# Patient Record
Sex: Male | Born: 1985
Health system: Southern US, Community
[De-identification: ages and names within clinical notes are randomized; demographics above are authoritative.]

## PROBLEM LIST (undated history)

## (undated) ENCOUNTER — Emergency Department (HOSPITAL_COMMUNITY): Admission: EM | Payer: BC Managed Care – PPO | Source: Home / Self Care

## (undated) DIAGNOSIS — T7840XA Allergy, unspecified, initial encounter: Secondary | ICD-10-CM

## (undated) DIAGNOSIS — J45909 Unspecified asthma, uncomplicated: Secondary | ICD-10-CM

## (undated) DIAGNOSIS — C9211 Chronic myeloid leukemia, BCR/ABL-positive, in remission: Secondary | ICD-10-CM

## (undated) DIAGNOSIS — C9210 Chronic myeloid leukemia, BCR/ABL-positive, not having achieved remission: Secondary | ICD-10-CM

## (undated) DIAGNOSIS — F329 Major depressive disorder, single episode, unspecified: Secondary | ICD-10-CM

## (undated) DIAGNOSIS — F32A Depression, unspecified: Secondary | ICD-10-CM

## (undated) DIAGNOSIS — G43909 Migraine, unspecified, not intractable, without status migrainosus: Secondary | ICD-10-CM

## (undated) DIAGNOSIS — C9590 Leukemia, unspecified not having achieved remission: Secondary | ICD-10-CM

## (undated) HISTORY — DX: Chronic myeloid leukemia, BCR/ABL-positive, in remission: C92.11

## (undated) HISTORY — DX: Chronic myeloid leukemia, BCR/ABL-positive, not having achieved remission: C92.10

## (undated) HISTORY — PX: BRONCHOSCOPY: SUR163

## (undated) HISTORY — DX: Allergy, unspecified, initial encounter: T78.40XA

## (undated) HISTORY — DX: Unspecified asthma, uncomplicated: J45.909

## (undated) HISTORY — PX: APPENDECTOMY: SHX54

## (undated) HISTORY — DX: Migraine, unspecified, not intractable, without status migrainosus: G43.909

## (undated) HISTORY — PX: FINGER SURGERY: SHX640

## (undated) HISTORY — DX: Leukemia, unspecified not having achieved remission: C95.90

---

## 2008-05-08 DIAGNOSIS — C9590 Leukemia, unspecified not having achieved remission: Secondary | ICD-10-CM

## 2008-05-08 HISTORY — DX: Leukemia, unspecified not having achieved remission: C95.90

## 2008-08-12 ENCOUNTER — Ambulatory Visit: Payer: Self-pay | Admitting: Oncology

## 2008-08-12 ENCOUNTER — Inpatient Hospital Stay (HOSPITAL_COMMUNITY): Admission: EM | Admit: 2008-08-12 | Discharge: 2008-08-14 | Payer: Self-pay | Admitting: Emergency Medicine

## 2008-08-13 ENCOUNTER — Encounter (INDEPENDENT_AMBULATORY_CARE_PROVIDER_SITE_OTHER): Payer: Self-pay | Admitting: Interventional Radiology

## 2008-08-14 ENCOUNTER — Ambulatory Visit: Payer: Self-pay | Admitting: Oncology

## 2008-08-21 LAB — MANUAL DIFFERENTIAL
ALC: 0 10*3/uL — ABNORMAL LOW (ref 0.9–3.3)
ANC (CHCC manual diff): 231.8 10*3/uL — ABNORMAL HIGH (ref 1.5–6.5)
Band Neutrophils: 18 % — ABNORMAL HIGH (ref 0–10)
Basophil: 6 % — ABNORMAL HIGH (ref 0–2)
Blasts: 2 % — ABNORMAL HIGH (ref 0–0)
Metamyelocytes: 15 % — ABNORMAL HIGH (ref 0–0)
PROMYELO: 4 % — ABNORMAL HIGH (ref 0–0)
Variant Lymph: 0 % (ref 0–0)
nRBC: 0 % (ref 0–0)

## 2008-08-21 LAB — COMPREHENSIVE METABOLIC PANEL
Albumin: 4 g/dL (ref 3.5–5.2)
BUN: 21 mg/dL (ref 6–23)
CO2: 25 mEq/L (ref 19–32)
Glucose, Bld: 76 mg/dL (ref 70–99)
Potassium: 3.8 mEq/L (ref 3.5–5.3)
Sodium: 135 mEq/L (ref 135–145)
Total Protein: 7.2 g/dL (ref 6.0–8.3)

## 2008-08-21 LAB — LACTATE DEHYDROGENASE: LDH: 350 U/L — ABNORMAL HIGH (ref 94–250)

## 2008-08-21 LAB — CBC WITH DIFFERENTIAL/PLATELET
HCT: 29.3 % — ABNORMAL LOW (ref 38.4–49.9)
HGB: 9.4 g/dL — ABNORMAL LOW (ref 13.0–17.1)
MCH: 28.4 pg (ref 27.2–33.4)
MCV: 88.8 fL (ref 79.3–98.0)
MONO%: 0.4 % (ref 0.0–14.0)
Platelets: 353 10*3/uL (ref 140–400)
RDW: 20.8 % — ABNORMAL HIGH (ref 11.0–14.6)
WBC: 269.5 10*3/uL (ref 4.0–10.3)

## 2008-09-03 LAB — CBC WITH DIFFERENTIAL/PLATELET
LYMPH%: 4.6 % — ABNORMAL LOW (ref 14.0–49.0)
MONO%: 1.6 % (ref 0.0–14.0)
NEUT%: 90.7 % — ABNORMAL HIGH (ref 39.0–75.0)
RBC: 3.9 10*6/uL — ABNORMAL LOW (ref 4.20–5.82)
RDW: 20.4 % — ABNORMAL HIGH (ref 11.0–14.6)
WBC: 71.3 10*3/uL (ref 4.0–10.3)

## 2008-09-03 LAB — COMPREHENSIVE METABOLIC PANEL
ALT: 24 U/L (ref 0–53)
Albumin: 4.3 g/dL (ref 3.5–5.2)
Alkaline Phosphatase: 67 U/L (ref 39–117)
CO2: 26 mEq/L (ref 19–32)
Glucose, Bld: 75 mg/dL (ref 70–99)
Potassium: 4.2 mEq/L (ref 3.5–5.3)
Sodium: 137 mEq/L (ref 135–145)
Total Bilirubin: 0.6 mg/dL (ref 0.3–1.2)
Total Protein: 7.4 g/dL (ref 6.0–8.3)

## 2008-09-03 LAB — MANUAL DIFFERENTIAL
Basophil: 4 % — ABNORMAL HIGH (ref 0–2)
EOS: 1 % (ref 0–7)
MONO: 2 % (ref 0–14)
Metamyelocytes: 3 % — ABNORMAL HIGH (ref 0–0)
Myelocytes: 1 % — ABNORMAL HIGH (ref 0–0)
Other Cell: 0 % (ref 0–0)
PLT EST: ADEQUATE

## 2008-09-03 LAB — LACTATE DEHYDROGENASE: LDH: 150 U/L (ref 94–250)

## 2008-09-17 LAB — COMPREHENSIVE METABOLIC PANEL
ALT: 22 U/L (ref 0–53)
AST: 15 U/L (ref 0–37)
Alkaline Phosphatase: 80 U/L (ref 39–117)
Calcium: 9 mg/dL (ref 8.4–10.5)
Chloride: 107 mEq/L (ref 96–112)
Creatinine, Ser: 0.95 mg/dL (ref 0.40–1.50)
Total Bilirubin: 0.7 mg/dL (ref 0.3–1.2)

## 2008-09-17 LAB — CBC WITH DIFFERENTIAL/PLATELET
BASO%: 0.8 % (ref 0.0–2.0)
EOS%: 1.9 % (ref 0.0–7.0)
HCT: 31.1 % — ABNORMAL LOW (ref 38.4–49.9)
MCH: 29.4 pg (ref 27.2–33.4)
MCHC: 33.6 g/dL (ref 32.0–36.0)
NEUT%: 73.2 % (ref 39.0–75.0)
lymph#: 0.9 10*3/uL (ref 0.9–3.3)

## 2008-10-20 ENCOUNTER — Ambulatory Visit: Payer: Self-pay | Admitting: Oncology

## 2008-10-22 LAB — COMPREHENSIVE METABOLIC PANEL
ALT: 23 U/L (ref 0–53)
CO2: 25 mEq/L (ref 19–32)
Calcium: 9.2 mg/dL (ref 8.4–10.5)
Chloride: 103 mEq/L (ref 96–112)
Potassium: 4.4 mEq/L (ref 3.5–5.3)
Sodium: 140 mEq/L (ref 135–145)
Total Bilirubin: 1 mg/dL (ref 0.3–1.2)
Total Protein: 7.4 g/dL (ref 6.0–8.3)

## 2008-10-22 LAB — CBC WITH DIFFERENTIAL/PLATELET
BASO%: 0.5 % (ref 0.0–2.0)
MCHC: 34 g/dL (ref 32.0–36.0)
MONO#: 0.3 10*3/uL (ref 0.1–0.9)
RBC: 4.64 10*6/uL (ref 4.20–5.82)
RDW: 16.2 % — ABNORMAL HIGH (ref 11.0–14.6)
WBC: 5.7 10*3/uL (ref 4.0–10.3)
lymph#: 1.4 10*3/uL (ref 0.9–3.3)

## 2008-10-28 LAB — BCR/ABL

## 2008-11-27 ENCOUNTER — Ambulatory Visit: Payer: Self-pay | Admitting: Oncology

## 2008-12-02 LAB — CBC WITH DIFFERENTIAL/PLATELET
BASO%: 0.3 % (ref 0.0–2.0)
Eosinophils Absolute: 0.2 10*3/uL (ref 0.0–0.5)
LYMPH%: 29.4 % (ref 14.0–49.0)
MCHC: 32.7 g/dL (ref 32.0–36.0)
MONO#: 0.4 10*3/uL (ref 0.1–0.9)
NEUT#: 5.4 10*3/uL (ref 1.5–6.5)
Platelets: 124 10*3/uL — ABNORMAL LOW (ref 140–400)
RBC: 5.58 10*6/uL (ref 4.20–5.82)
RDW: 18.2 % — ABNORMAL HIGH (ref 11.0–14.6)
WBC: 8.6 10*3/uL (ref 4.0–10.3)
lymph#: 2.5 10*3/uL (ref 0.9–3.3)

## 2008-12-02 LAB — COMPREHENSIVE METABOLIC PANEL
ALT: 32 U/L (ref 0–53)
Albumin: 4.2 g/dL (ref 3.5–5.2)
CO2: 26 mEq/L (ref 19–32)
Calcium: 9.1 mg/dL (ref 8.4–10.5)
Chloride: 102 mEq/L (ref 96–112)
Glucose, Bld: 91 mg/dL (ref 70–99)
Potassium: 3.6 mEq/L (ref 3.5–5.3)
Sodium: 137 mEq/L (ref 135–145)
Total Protein: 7.5 g/dL (ref 6.0–8.3)

## 2009-01-18 ENCOUNTER — Ambulatory Visit: Payer: Self-pay | Admitting: Oncology

## 2009-02-01 LAB — COMPREHENSIVE METABOLIC PANEL
AST: 22 U/L (ref 0–37)
Alkaline Phosphatase: 106 U/L (ref 39–117)
BUN: 12 mg/dL (ref 6–23)
Calcium: 9.4 mg/dL (ref 8.4–10.5)
Chloride: 102 mEq/L (ref 96–112)
Creatinine, Ser: 0.92 mg/dL (ref 0.40–1.50)
Glucose, Bld: 90 mg/dL (ref 70–99)

## 2009-02-01 LAB — CBC WITH DIFFERENTIAL/PLATELET
BASO%: 0.2 % (ref 0.0–2.0)
Basophils Absolute: 0 10*3/uL (ref 0.0–0.1)
EOS%: 1.7 % (ref 0.0–7.0)
HCT: 38.7 % (ref 38.4–49.9)
HGB: 13.6 g/dL (ref 13.0–17.1)
MCH: 29 pg (ref 27.2–33.4)
MCHC: 35.2 g/dL (ref 32.0–36.0)
MCV: 82.3 fL (ref 79.3–98.0)
MONO%: 6.1 % (ref 0.0–14.0)
NEUT%: 35.1 % — ABNORMAL LOW (ref 39.0–75.0)
RDW: 27.2 % — ABNORMAL HIGH (ref 11.0–14.6)

## 2009-02-09 LAB — CBC WITH DIFFERENTIAL/PLATELET
BASO%: 0.5 % (ref 0.0–2.0)
LYMPH%: 56.3 % — ABNORMAL HIGH (ref 14.0–49.0)
MCHC: 35.3 g/dL (ref 32.0–36.0)
MCV: 84.5 fL (ref 79.3–98.0)
MONO%: 7 % (ref 0.0–14.0)
Platelets: 44 10*3/uL — ABNORMAL LOW (ref 140–400)
RBC: 4.6 10*6/uL (ref 4.20–5.82)

## 2009-02-09 LAB — BCR/ABL

## 2009-02-17 ENCOUNTER — Ambulatory Visit: Payer: Self-pay | Admitting: Oncology

## 2009-02-17 LAB — CBC WITH DIFFERENTIAL/PLATELET
BASO%: 1 % (ref 0.0–2.0)
EOS%: 0.4 % (ref 0.0–7.0)
Eosinophils Absolute: 0 10*3/uL (ref 0.0–0.5)
LYMPH%: 54.8 % — ABNORMAL HIGH (ref 14.0–49.0)
MCHC: 35.6 g/dL (ref 32.0–36.0)
MCV: 84.3 fL (ref 79.3–98.0)
MONO%: 8.4 % (ref 0.0–14.0)
NEUT#: 1.7 10*3/uL (ref 1.5–6.5)
Platelets: 27 10*3/uL — ABNORMAL LOW (ref 140–400)
RBC: 4.2 10*6/uL (ref 4.20–5.82)
RDW: 24.1 % — ABNORMAL HIGH (ref 11.0–14.6)
nRBC: 0 % (ref 0–0)

## 2009-02-24 LAB — COMPREHENSIVE METABOLIC PANEL
ALT: 19 U/L (ref 0–53)
AST: 12 U/L (ref 0–37)
Albumin: 4.6 g/dL (ref 3.5–5.2)
Alkaline Phosphatase: 83 U/L (ref 39–117)
BUN: 19 mg/dL (ref 6–23)
Chloride: 105 mEq/L (ref 96–112)
Potassium: 3.8 mEq/L (ref 3.5–5.3)
Sodium: 138 mEq/L (ref 135–145)

## 2009-02-24 LAB — CBC WITH DIFFERENTIAL/PLATELET
BASO%: 0.6 % (ref 0.0–2.0)
EOS%: 0.4 % (ref 0.0–7.0)
MCH: 31.3 pg (ref 27.2–33.4)
MCHC: 35.3 g/dL (ref 32.0–36.0)
MONO#: 0.3 10*3/uL (ref 0.1–0.9)
NEUT%: 49.2 % (ref 39.0–75.0)
RBC: 3.97 10*6/uL — ABNORMAL LOW (ref 4.20–5.82)
RDW: 28.3 % — ABNORMAL HIGH (ref 11.0–14.6)
WBC: 4.2 10*3/uL (ref 4.0–10.3)
lymph#: 1.8 10*3/uL (ref 0.9–3.3)

## 2009-02-24 LAB — CHCC SMEAR

## 2009-03-10 LAB — CBC WITH DIFFERENTIAL/PLATELET
Eosinophils Absolute: 0 10*3/uL (ref 0.0–0.5)
LYMPH%: 31.1 % (ref 14.0–49.0)
MCH: 33.1 pg (ref 27.2–33.4)
MCV: 95 fL (ref 79.3–98.0)
MONO%: 8.4 % (ref 0.0–14.0)
NEUT#: 2.8 10*3/uL (ref 1.5–6.5)
Platelets: 47 10*3/uL — ABNORMAL LOW (ref 140–400)
RBC: 3.71 10*6/uL — ABNORMAL LOW (ref 4.20–5.82)

## 2009-03-22 ENCOUNTER — Ambulatory Visit: Payer: Self-pay | Admitting: Oncology

## 2009-03-24 LAB — COMPREHENSIVE METABOLIC PANEL
ALT: 25 U/L (ref 0–53)
AST: 16 U/L (ref 0–37)
Alkaline Phosphatase: 79 U/L (ref 39–117)
Sodium: 136 mEq/L (ref 135–145)
Total Bilirubin: 1.3 mg/dL — ABNORMAL HIGH (ref 0.3–1.2)
Total Protein: 7.1 g/dL (ref 6.0–8.3)

## 2009-03-24 LAB — CBC WITH DIFFERENTIAL/PLATELET
BASO%: 0.6 % (ref 0.0–2.0)
LYMPH%: 35.7 % (ref 14.0–49.0)
MCHC: 34.7 g/dL (ref 32.0–36.0)
MCV: 98.7 fL — ABNORMAL HIGH (ref 79.3–98.0)
MONO#: 0.4 10*3/uL (ref 0.1–0.9)
MONO%: 8.3 % (ref 0.0–14.0)
Platelets: 53 10*3/uL — ABNORMAL LOW (ref 140–400)
RBC: 3.46 10*6/uL — ABNORMAL LOW (ref 4.20–5.82)
WBC: 5 10*3/uL (ref 4.0–10.3)

## 2009-05-28 ENCOUNTER — Ambulatory Visit: Payer: Self-pay | Admitting: Oncology

## 2009-06-01 LAB — CBC WITH DIFFERENTIAL/PLATELET
Basophils Absolute: 0.1 10*3/uL (ref 0.0–0.1)
EOS%: 1.6 % (ref 0.0–7.0)
MCH: 34.3 pg — ABNORMAL HIGH (ref 27.2–33.4)
MCHC: 34.5 g/dL (ref 32.0–36.0)
MCV: 99.4 fL — ABNORMAL HIGH (ref 79.3–98.0)
MONO%: 6 % (ref 0.0–14.0)
RBC: 4.15 10*6/uL — ABNORMAL LOW (ref 4.20–5.82)
RDW: 14.3 % (ref 11.0–14.6)

## 2009-06-01 LAB — COMPREHENSIVE METABOLIC PANEL
AST: 20 U/L (ref 0–37)
Albumin: 4.5 g/dL (ref 3.5–5.2)
Alkaline Phosphatase: 68 U/L (ref 39–117)
BUN: 17 mg/dL (ref 6–23)
Potassium: 4.2 mEq/L (ref 3.5–5.3)
Sodium: 139 mEq/L (ref 135–145)

## 2009-07-20 ENCOUNTER — Ambulatory Visit: Payer: Self-pay | Admitting: Oncology

## 2009-07-22 LAB — COMPREHENSIVE METABOLIC PANEL
BUN: 15 mg/dL (ref 6–23)
CO2: 24 mEq/L (ref 19–32)
Calcium: 9.4 mg/dL (ref 8.4–10.5)
Chloride: 103 mEq/L (ref 96–112)
Creatinine, Ser: 1.07 mg/dL (ref 0.40–1.50)
Glucose, Bld: 139 mg/dL — ABNORMAL HIGH (ref 70–99)
Total Bilirubin: 1 mg/dL (ref 0.3–1.2)

## 2009-07-22 LAB — CBC WITH DIFFERENTIAL/PLATELET
Eosinophils Absolute: 0.3 10*3/uL (ref 0.0–0.5)
HCT: 45.5 % (ref 38.4–49.9)
LYMPH%: 15.1 % (ref 14.0–49.0)
MCV: 95.8 fL (ref 79.3–98.0)
MONO#: 0.8 10*3/uL (ref 0.1–0.9)
MONO%: 4.5 % (ref 0.0–14.0)
NEUT#: 13.7 10*3/uL — ABNORMAL HIGH (ref 1.5–6.5)
NEUT%: 78.5 % — ABNORMAL HIGH (ref 39.0–75.0)
Platelets: 202 10*3/uL (ref 140–400)
WBC: 17.4 10*3/uL — ABNORMAL HIGH (ref 4.0–10.3)

## 2009-07-29 LAB — BCR/ABL

## 2009-09-08 ENCOUNTER — Ambulatory Visit: Payer: Self-pay | Admitting: Oncology

## 2009-11-02 ENCOUNTER — Ambulatory Visit: Payer: Self-pay | Admitting: Oncology

## 2009-11-04 LAB — COMPREHENSIVE METABOLIC PANEL
AST: 21 U/L (ref 0–37)
Albumin: 4.3 g/dL (ref 3.5–5.2)
Alkaline Phosphatase: 68 U/L (ref 39–117)
BUN: 13 mg/dL (ref 6–23)
Glucose, Bld: 101 mg/dL — ABNORMAL HIGH (ref 70–99)
Potassium: 3.8 mEq/L (ref 3.5–5.3)
Sodium: 139 mEq/L (ref 135–145)
Total Bilirubin: 1.3 mg/dL — ABNORMAL HIGH (ref 0.3–1.2)
Total Protein: 7.3 g/dL (ref 6.0–8.3)

## 2009-11-04 LAB — CBC WITH DIFFERENTIAL/PLATELET
BASO%: 0.2 % (ref 0.0–2.0)
Basophils Absolute: 0 10*3/uL (ref 0.0–0.1)
EOS%: 1.4 % (ref 0.0–7.0)
MCH: 31.6 pg (ref 27.2–33.4)
MCHC: 34.6 g/dL (ref 32.0–36.0)
MCV: 91.3 fL (ref 79.3–98.0)
MONO%: 4 % (ref 0.0–14.0)
RDW: 14 % (ref 11.0–14.6)
lymph#: 2.2 10*3/uL (ref 0.9–3.3)

## 2009-12-08 ENCOUNTER — Ambulatory Visit: Payer: Self-pay | Admitting: Oncology

## 2009-12-08 LAB — TECHNOLOGIST REVIEW

## 2009-12-08 LAB — CBC WITH DIFFERENTIAL/PLATELET
BASO%: 0.1 % (ref 0.0–2.0)
Eosinophils Absolute: 0.5 10*3/uL (ref 0.0–0.5)
LYMPH%: 12.8 % — ABNORMAL LOW (ref 14.0–49.0)
MONO#: 0.6 10*3/uL (ref 0.1–0.9)
NEUT#: 28.6 10*3/uL — ABNORMAL HIGH (ref 1.5–6.5)
Platelets: 342 10*3/uL (ref 140–400)
RBC: 5.11 10*6/uL (ref 4.20–5.82)
WBC: 34.1 10*3/uL — ABNORMAL HIGH (ref 4.0–10.3)
lymph#: 4.4 10*3/uL — ABNORMAL HIGH (ref 0.9–3.3)

## 2009-12-08 LAB — COMPREHENSIVE METABOLIC PANEL
ALT: 36 U/L (ref 0–53)
AST: 20 U/L (ref 0–37)
Albumin: 4.8 g/dL (ref 3.5–5.2)
CO2: 21 mEq/L (ref 19–32)
Calcium: 9.7 mg/dL (ref 8.4–10.5)
Chloride: 107 mEq/L (ref 96–112)
Potassium: 4 mEq/L (ref 3.5–5.3)
Sodium: 139 mEq/L (ref 135–145)
Total Protein: 7.5 g/dL (ref 6.0–8.3)

## 2009-12-08 LAB — LACTATE DEHYDROGENASE: LDH: 263 U/L — ABNORMAL HIGH (ref 94–250)

## 2010-01-07 ENCOUNTER — Ambulatory Visit: Payer: Self-pay | Admitting: Oncology

## 2010-01-07 LAB — CBC WITH DIFFERENTIAL/PLATELET
BASO%: 0.4 % (ref 0.0–2.0)
Basophils Absolute: 0 10e3/uL (ref 0.0–0.1)
EOS%: 1.1 % (ref 0.0–7.0)
Eosinophils Absolute: 0.1 10e3/uL (ref 0.0–0.5)
HCT: 45.3 % (ref 38.4–49.9)
HGB: 15.3 g/dL (ref 13.0–17.1)
LYMPH%: 19.4 % (ref 14.0–49.0)
MCH: 31 pg (ref 27.2–33.4)
MCHC: 33.8 g/dL (ref 32.0–36.0)
MCV: 91.6 fL (ref 79.3–98.0)
MONO#: 0.3 10e3/uL (ref 0.1–0.9)
MONO%: 4.5 % (ref 0.0–14.0)
NEUT#: 5.8 10e3/uL (ref 1.5–6.5)
NEUT%: 74.6 % (ref 39.0–75.0)
Platelets: 197 10e3/uL (ref 140–400)
RBC: 4.95 10e6/uL (ref 4.20–5.82)
RDW: 15.9 % — ABNORMAL HIGH (ref 11.0–14.6)
WBC: 7.7 10e3/uL (ref 4.0–10.3)
lymph#: 1.5 10e3/uL (ref 0.9–3.3)

## 2010-01-07 LAB — COMPREHENSIVE METABOLIC PANEL
Albumin: 4.4 g/dL (ref 3.5–5.2)
Alkaline Phosphatase: 70 U/L (ref 39–117)
BUN: 11 mg/dL (ref 6–23)
CO2: 25 mEq/L (ref 19–32)
Glucose, Bld: 117 mg/dL — ABNORMAL HIGH (ref 70–99)
Potassium: 3.7 mEq/L (ref 3.5–5.3)
Total Bilirubin: 2.4 mg/dL — ABNORMAL HIGH (ref 0.3–1.2)

## 2010-01-07 LAB — LACTATE DEHYDROGENASE: LDH: 101 U/L (ref 94–250)

## 2010-02-07 ENCOUNTER — Ambulatory Visit: Payer: Self-pay | Admitting: Oncology

## 2010-02-07 LAB — COMPREHENSIVE METABOLIC PANEL
Alkaline Phosphatase: 73 U/L (ref 39–117)
BUN: 15 mg/dL (ref 6–23)
Creatinine, Ser: 1.05 mg/dL (ref 0.40–1.50)
Glucose, Bld: 92 mg/dL (ref 70–99)
Total Bilirubin: 1.2 mg/dL (ref 0.3–1.2)

## 2010-02-07 LAB — CBC WITH DIFFERENTIAL/PLATELET
Basophils Absolute: 0 10*3/uL (ref 0.0–0.1)
EOS%: 1.6 % (ref 0.0–7.0)
HGB: 16.2 g/dL (ref 13.0–17.1)
MCH: 31.2 pg (ref 27.2–33.4)
MCV: 90.6 fL (ref 79.3–98.0)
MONO%: 3.8 % (ref 0.0–14.0)
NEUT%: 74.4 % (ref 39.0–75.0)
RDW: 14.5 % (ref 11.0–14.6)

## 2010-04-11 ENCOUNTER — Ambulatory Visit (HOSPITAL_BASED_OUTPATIENT_CLINIC_OR_DEPARTMENT_OTHER): Payer: Self-pay | Admitting: Oncology

## 2010-04-12 LAB — LACTATE DEHYDROGENASE: LDH: 98 U/L (ref 94–250)

## 2010-04-12 LAB — COMPREHENSIVE METABOLIC PANEL
ALT: 32 U/L (ref 0–53)
AST: 14 U/L (ref 0–37)
Albumin: 4.6 g/dL (ref 3.5–5.2)
BUN: 12 mg/dL (ref 6–23)
CO2: 22 mEq/L (ref 19–32)
Calcium: 9.2 mg/dL (ref 8.4–10.5)
Chloride: 106 mEq/L (ref 96–112)
Potassium: 4 mEq/L (ref 3.5–5.3)

## 2010-04-12 LAB — CBC WITH DIFFERENTIAL/PLATELET
Basophils Absolute: 0 10*3/uL (ref 0.0–0.1)
EOS%: 3.3 % (ref 0.0–7.0)
HGB: 15.4 g/dL (ref 13.0–17.1)
MCH: 31.5 pg (ref 27.2–33.4)
MONO#: 0.3 10*3/uL (ref 0.1–0.9)
NEUT#: 3.3 10*3/uL (ref 1.5–6.5)
RDW: 16.1 % — ABNORMAL HIGH (ref 11.0–14.6)
WBC: 5.1 10*3/uL (ref 4.0–10.3)
lymph#: 1.4 10*3/uL (ref 0.9–3.3)

## 2010-07-09 ENCOUNTER — Ambulatory Visit (INDEPENDENT_AMBULATORY_CARE_PROVIDER_SITE_OTHER): Payer: 59

## 2010-07-09 ENCOUNTER — Inpatient Hospital Stay (INDEPENDENT_AMBULATORY_CARE_PROVIDER_SITE_OTHER)
Admission: RE | Admit: 2010-07-09 | Discharge: 2010-07-09 | Disposition: A | Discharge status: Home / Self Care | Payer: 59 | Source: Ambulatory Visit | Attending: Family Medicine | Admitting: Family Medicine

## 2010-07-09 DIAGNOSIS — Z23 Encounter for immunization: Secondary | ICD-10-CM

## 2010-07-09 DIAGNOSIS — S61209A Unspecified open wound of unspecified finger without damage to nail, initial encounter: Secondary | ICD-10-CM

## 2010-07-17 NOTE — Op Note (Signed)
NAME:  Nathan Kerr, Nathan Kerr NO.:  [id number]  MEDICAL RECORD NO.:  [id number]           PATIENT TYPE:  O  LOCATION:  URG                          FACILITY:  [facility]  PHYSICIAN:  Nathan Kerr, M.D.DATE OF BIRTH:  [birth year]  DATE OF PROCEDURE: DATE OF DISCHARGE:  07/09/2010                              OPERATIVE REPORT   I had the pleasure to see Ms. Nathan Kerr in [facility] system. This patient is a 25 year old male who was using a scale saw today, July 09, 2010, and ran it across his left index finger, sustained an open injury to the distal phalanx and avulsive soft tissue.  I was asked to see him to take over his care of the emergency room staff.  PAST MEDICAL HISTORY:  Leukemia.  PAST SURGICAL HISTORY:  Appendectomy.  MEDICINES:  He is still on leukemia medicine regime.  ALLERGIES:  None.  SOCIAL HISTORY:  He is a nonsmoker.  He denies illicit drug use.  He works for a [employer], [employer].  REVIEW OF SYSTEMS:  Negative for fever, chills, nausea, vomiting, and malaise.  PHYSICAL EXAMINATION:  GENERAL:  A pleasant male. CHEST:  Clear. HEENT:  Within normal limits. EXTREMITIES:  Right upper extremity is atraumatic.  Lower extremity examination is benign.  He walks with non-antalgic gait.  Left index finger has a laceration secondary to scale saw over the distal phalanx with exposed bone and distal radius soft tissue.  I have reviewed this with him at length and the findings.  FDP, FDS, and extensor function about the PIP and MCP joint is intact.  X-rays showed distal phalanx fracture about the left index finger.  IMPRESSION:  Open distal phalanx fracture, left index finger.  PLAN:  We have consented him for I&D and repair as necessary.  PROCEDURE NOTE:  The patient was taken to the procedural area and underwent intermetacarpal/flexor sheath block.  He was then prepped and draped in the usual sterile fashion, Betadine scrub and  paint. Following this, the patient underwent I&D of skin, subcutaneous tissue, bone, and associated devitalized skin tissue.  This was done without difficulty.  Greater than a liter of saline was placed through the wound.  Following this, I then performed open treatment of the distal phalanx fracture.  Following this, I then performed a row stitch technique for the tendon apparatus.  Thus, the patient underwent tendon repair, open treatment of the distal phalanx fracture left index finger, and I&D of left index finger.  He tolerated this well.  Following this, the fingers were in nice position and I was pleased with these findings.  The patient was counseled in regards to the postop care plan.  He was given Keflex 500 mg 1 p.o. q.i.d. x14 days and Vicodin 1-2 q.4-6 h. p.r.n. pain p.o., dispensed #45 with  refill, 5 mg tablet for severe pain and for less severe pain tramadol 50 mg 1-2 q.4-6 h. p.r.n. pain p.o.  He will return to see him in a week.  Do's and don's were discussed and all questions were encouraged and answered.     Nathan Kerr, M.D.  WMG/MEDQ  D:  07/09/2010  T:  07/10/2010  Job:  [id number]  Electronically Signed by Dominica Severin M.D. on 07/17/2010 07:50:57 AM

## 2010-08-16 ENCOUNTER — Encounter (HOSPITAL_BASED_OUTPATIENT_CLINIC_OR_DEPARTMENT_OTHER): Payer: 59 | Admitting: Oncology

## 2010-08-16 DIAGNOSIS — C9210 Chronic myeloid leukemia, BCR/ABL-positive, not having achieved remission: Secondary | ICD-10-CM

## 2010-08-16 DIAGNOSIS — C9211 Chronic myeloid leukemia, BCR/ABL-positive, in remission: Secondary | ICD-10-CM

## 2010-08-16 DIAGNOSIS — D696 Thrombocytopenia, unspecified: Secondary | ICD-10-CM

## 2010-08-16 LAB — COMPREHENSIVE METABOLIC PANEL
ALT: 40 U/L (ref 0–53)
AST: 19 U/L (ref 0–37)
Albumin: 4.7 g/dL (ref 3.5–5.2)
Alkaline Phosphatase: 68 U/L (ref 39–117)
Potassium: 3.8 mEq/L (ref 3.5–5.3)
Sodium: 138 mEq/L (ref 135–145)
Total Protein: 7.2 g/dL (ref 6.0–8.3)

## 2010-08-16 LAB — CBC WITH DIFFERENTIAL/PLATELET
EOS%: 2 % (ref 0.0–7.0)
MCH: 31 pg (ref 27.2–33.4)
MCV: 91.2 fL (ref 79.3–98.0)
MONO%: 3.6 % (ref 0.0–14.0)
NEUT#: 5.9 10*3/uL (ref 1.5–6.5)
RBC: 5.21 10*6/uL (ref 4.20–5.82)
RDW: 14.2 % (ref 11.0–14.6)

## 2010-08-17 LAB — COMPREHENSIVE METABOLIC PANEL
ALT: 14 U/L (ref 0–53)
AST: 17 U/L (ref 0–37)
AST: 21 U/L (ref 0–37)
BUN: 10 mg/dL (ref 6–23)
CO2: 25 mEq/L (ref 19–32)
Calcium: 8 mg/dL — ABNORMAL LOW (ref 8.4–10.5)
Calcium: 8.4 mg/dL (ref 8.4–10.5)
Chloride: 106 mEq/L (ref 96–112)
Creatinine, Ser: 0.93 mg/dL (ref 0.4–1.5)
GFR calc Af Amer: >60 mL/min (ref >60)
GFR calc Af Amer: >60 mL/min (ref >60)
GFR calc non Af Amer: >60 mL/min (ref >60)
Glucose, Bld: 94 mg/dL (ref 70–99)
Sodium: 141 mEq/L (ref 135–145)
Total Bilirubin: 0.9 mg/dL (ref 0.3–1.2)
Total Protein: 6.7 g/dL (ref 6.0–8.3)

## 2010-08-17 LAB — DIFFERENTIAL
Band Neutrophils: 26 % — ABNORMAL HIGH (ref 0–10)
Basophils Absolute: 3.7 10*3/uL — ABNORMAL HIGH (ref 0.0–0.1)
Basophils Relative: 2 % — ABNORMAL HIGH (ref 0–1)
Blasts: 10 %
Blasts: 14 %
Eosinophils Absolute: 1.9 10*3/uL — ABNORMAL HIGH (ref 0.0–0.7)
Eosinophils Relative: 1 % (ref 0–5)
Lymphocytes Relative: 0 % — ABNORMAL LOW (ref 12–46)
Lymphocytes Relative: 1 % — ABNORMAL LOW (ref 12–46)
Lymphs Abs: 0 10*3/uL — ABNORMAL LOW (ref 0.7–4.0)
Lymphs Abs: 2.5 10*3/uL (ref 0.7–4.0)
Metamyelocytes Relative: 13 %
Monocytes Absolute: 0 10*3/uL — ABNORMAL LOW (ref 0.1–1.0)
Monocytes Absolute: 10.7 10*3/uL — ABNORMAL HIGH (ref 0.1–1.0)
Monocytes Relative: 0 % — ABNORMAL LOW (ref 3–12)
Monocytes Relative: 5 % (ref 3–12)
Myelocytes: 17 %
Neutro Abs: 72.8 10*3/uL — ABNORMAL HIGH (ref 1.7–7.7)
Neutro Abs: 86.1 10*3/uL — ABNORMAL HIGH (ref 1.7–7.7)
Neutrophils Relative %: 25 % — ABNORMAL LOW (ref 43–77)
Neutrophils Relative %: 34 % — ABNORMAL LOW (ref 43–77)
Promyelocytes Absolute: 0 %
Promyelocytes Absolute: 3 %
WBC Morphology: INCREASED
nRBC: 0 /100 WBC
nRBC: 0 /100 WBC
nRBC: 0 /100 WBC

## 2010-08-17 LAB — CBC
HCT: 24.3 % — ABNORMAL LOW (ref 39.0–52.0)
MCHC: 35.4 g/dL (ref 30.0–36.0)
MCHC: 35.7 g/dL (ref 30.0–36.0)
MCHC: 35.8 g/dL (ref 30.0–36.0)
MCV: 89.3 fL (ref 78.0–100.0)
MCV: 90.2 fL (ref 78.0–100.0)
RBC: 2.69 MIL/uL — ABNORMAL LOW (ref 4.22–5.81)
RBC: 2.74 MIL/uL — ABNORMAL LOW (ref 4.22–5.81)
RDW: 20.2 % — ABNORMAL HIGH (ref 11.5–15.5)
RDW: 20.2 % — ABNORMAL HIGH (ref 11.5–15.5)

## 2010-08-17 LAB — CULTURE, BLOOD (ROUTINE X 2): Culture: NO GROWTH

## 2010-08-17 LAB — CHROMOSOME ANALYSIS, BONE MARROW

## 2010-08-17 LAB — PROTIME-INR
INR: 1.3 (ref 0.00–1.49)
INR: 1.4 (ref 0.00–1.49)

## 2010-08-17 LAB — POCT I-STAT, CHEM 8
Chloride: 106 mEq/L (ref 96–112)
HCT: 34 % — ABNORMAL LOW (ref 39.0–52.0)
Hemoglobin: 11.6 g/dL — ABNORMAL LOW (ref 13.0–17.0)
Potassium: 3.5 mEq/L (ref 3.5–5.1)

## 2010-08-17 LAB — URINE CULTURE: Colony Count: 7000

## 2010-08-17 LAB — URINALYSIS, ROUTINE W REFLEX MICROSCOPIC
Bilirubin Urine: NEGATIVE
Glucose, UA: NEGATIVE mg/dL
Hgb urine dipstick: NEGATIVE
Protein, ur: NEGATIVE mg/dL
Specific Gravity, Urine: 1.017 (ref 1.005–1.030)
Urobilinogen, UA: 0.2 mg/dL (ref 0.0–1.0)

## 2010-08-17 LAB — APTT: aPTT: 39 seconds — ABNORMAL HIGH (ref 24–37)

## 2010-08-17 LAB — BONE MARROW EXAM

## 2010-08-17 LAB — URIC ACID: Uric Acid, Serum: 7.2 mg/dL (ref 4.0–7.8)

## 2010-08-19 LAB — BCR/ABL (LIO MMD)

## 2010-09-20 NOTE — Discharge Summary (Signed)
NAME:  Nathan Kerr, Nathan Kerr NO.:  [id number]   MEDICAL RECORD NO.:  [id number]          PATIENT TYPE:  INP   LOCATION:  1307                         FACILITY:  [facility]   PHYSICIAN:  Blenda Nicely. Shadad        DATE OF BIRTH:  10/14/1985   DATE OF ADMISSION:  08/12/2008  DATE OF DISCHARGE:  08/14/2008                               DISCHARGE SUMMARY   ADMISSION DIAGNOSES:  1. Leukocytosis.  2. A massive splenomegaly.  3. Abdominal pain.   DISCHARGE DIAGNOSES:  1. Confirmed diagnosis of chronic phase chronic myelogenous leukemia.  2. Massive splenomegaly due to number 2.  3. Abdominal pain, resolved.   PROCEDURES:  Status post a bone marrow aspirate and biopsy done on August 13, 2008.   CONSULTS:  None.   HISTORY OF PRESENT ILLNESS:  [name] is a pleasant 25 year old  gentleman with good health and shape.  He has noted chronic symptoms of  fatigue and some tiredness and reported some abdominal fullness in the  last few months.  However, on August 12, 2008, presented with an acute  episode of abdominal pain.  He was evaluated at urgent care and he was  felt to have a massive splenomegaly and a white cell count that was  obtained at that time was felt to be the neighborhood of 253,000.  He  had a hemoglobin of 9.7 and platelet count of 278, and for that reason  the patient was sent to the emergency department where a CT scan  confirmed the presence of an enlarged spleen and leukocytosis and I was  asked to evaluate and admit the patient.  Prior to my evaluation in  emergency department, I felt that he has a leukocytosis, a white cell  count of 253,000, with a differential that showed about 10% to 14%  blasts and the patient was admitted for IV hydration and further  diagnostic workup.   HOSPITAL COURSE:  Initially, the patient was started on IV hydration and  started prophylactically on hydroxyurea at a dose of 1000 mg 2 times  daily prophylactically until the diagnosis  was confirmed.  Peripheral  blood was sent for flow cytometry to assess blast percentage as well as  a blood for FISH was also sent out at that time.  On August 13, 2008. the  patient underwent a bone marrow aspirate and biopsy under interventional  radiology which he tolerated very well.  Throughout his hospital stay  his white cell count dropped down quite nicely, initially to 214,000,  subsequently to 185,000, the percentages predominantly showed 2%  basophilia, neutrophilia, but less than 1% blasts.  His hemoglobin has  ranged between 8.7-9.7, platelet count of 258.  On the day of discharge,  peripheral blood, a FISH, was reported back.  By verbal report from the  cytogenetics lab at [facility] as to have a  confirmed BCR-ABL positive by FISH.  The bone marrow biopsy also  morphologically was discussed by Dr. 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  Clinically, the patient's  abdominal pain has resolved and he did not have any acute symptoms of  hyperleukocytosis and for that reason patient will be discharged home in  good condition and to start Gleevec as an outpatient.  So, his discharge  instruction was as follows.   DISCHARGE CONDITION:  Good.   MEDICATION:  1. Gleevec 400 mg daily.  2. Oxycodone 5 mg p.o. q.4 h p.r.n.   FOLLOW-UP:  At the [facility] on August 21, 2008, at 9:00  a.m.   Also, written information about Gleevec and CML was given to the  patient.  The risks and benefits of Gleevec were discussed in detail,  including side effects which include nausea, GI toxicity, fluid  retention, myelosuppression, hepatotoxicity as well diarrhea and  dermatological toxicity was all discussed in detail with Nathan Kerr.  In terms of logistics of obtaining Gleevec, the patient's insurance  company was contacted as well as his outpatient pharmacy has been  contacted and again arrangements have been made for Mr.  Kerr to get  the [facility] as an outpatient basis in the next 24-48 hours.   On the day of discharge, physical examination:  He was awake and alert,  appeared in no distress.  Blood pressure was 111/61, pulse was 56,  respiration 20, temperature was 98, 100% on air.  HEAD:  Normocephalic, atraumatic.  Pupils equal, round, reactive to  light.  Oral mucosa was moist and pink.  NECK:  Supple.  HEART:  Regular rate S1-S2.  LUNGS:  Clear.  ABDOMEN:  Firm.  His spleen was palpated all the way to the pelvic brim.  EXTREMITIES:  No edema.   Laboratory data, as mentioned, showed a hemoglobin of 8.7, platelet  count of 258, white cell count of 185, 1% blasts 25% neutrophils, 2%  basophils.  His LDH was normal at 245, his creatinine was normal at  2.93.  LFTs were all normal including calcium, albumin.           ______________________________  Blenda Nicely. [facility]  Electronically Signed     FNS/MEDQ  D:  08/14/2008  T:  08/14/2008  Job:  [id number]

## 2010-09-20 NOTE — H&P (Signed)
NAME:  Nathan Kerr, Nathan Kerr NO.:  [id number]   MEDICAL RECORD NO.:  [id number]          PATIENT TYPE:  EMS   LOCATION:  ED                           FACILITY[facility]  [facility]   PHYSICIAN:  Firas N. Shadad        DATE OF BIRTH:  January 24, 1986   DATE OF ADMISSION:  08/12/2008  DATE OF DISCHARGE:                              HISTORY & PHYSICAL   CHIEF COMPLAINT:  Abdominal pain.   HISTORY OF PRESENT ILLNESS:  This is a 25 year old relatively healthy  gentleman in good health and shape, currently a student at [employer] who  presented with acute symptoms of abdominal pain earlier this morning.  The pain is not associated with any fevers or chills.  It was not  associated with any nausea or vomiting or any GI symptoms.  The patient  presented initially to urgent care where he had a routine CBC done.  At  that time his total white cell count was not able to be determined.  His  platelet count appeared to be about 358 and hemoglobin was 10.5.  His  abdominal examination was tense and felt maybe he had an enlarged spleen  and was subsequently referred to [facility] Emergency Department for  evaluation.  Upon evaluating here at the emergency department a stat CBC  was obtained showing a hemoglobin of 9.7, white cell count 253, platelet  count of 278 with a possible idioblast in the peripheral blood. For that  reason I was asked to evaluate [name].   Upon my evaluation he was really not complaining of any abdominal pain.  He did not receive any Fentanyl or any pain medication while he was in  the emergency department.  He denied any fevers or chills.  Did report  very occasional night sweats.  Did not report any bulky adenopathy,  He  has not reported any other constitutional symptoms.  His performance  status activity level was up to around baseline.  However, did report  some maybe decrease in his exercise tolerance.   REVIEW OF SYSTEMS:  He does not report any headaches, any blurred  vision.  He did not report any motor or sensory neuropathy, alteration  in mental status.  Did not report any confusion, psychiatric issues or  depression.  He did not report any seizure activity.  He did not report  any fevers, chills, night sweats, weight loss.  Did not report any chest  pain, orthopnea, PND.  Did not report any shortness of breath, dyspnea  on exertion or cough, hemoptysis or wheezing.  Did not report any nausea  or vomiting.  Did report abdominal pain but no hematochezia or melena.  He did report frequency of urination and some nocturia.  Did not report  any dysuria, hematuria.  Did not report any other neurological symptoms.   PAST MEDICAL HISTORY:  Unremarkable.  No history of hypertension,  diabetes or coronary disease.   MEDICATIONS:  None.   ALLERGIES:  None.   SOCIAL HISTORY:  He is a Consulting civil engineer, is single and denies any alcohol or  tobacco abuse.  PHYSICAL EXAMINATION:  Patient alert, awake pleasant gentleman in no  distress.  Blood pressure is 114/68, pulse is 86-100, respirations 18,  100% on room air.  HEAD:  Normocephalic, atraumatic.  Pupils equal, round and reactive to  light.  Oral mucosa is moist and pink.  NECK:  Supple with adenoapthy.  HEART:  Regular rate.  S1, S2.  LUNGS:  Clear to auscultation.  No wheezes to percussion.  ABDOMEN:  Tense with a massive splenomegaly all the way to the pelvis.  No adenopathy was palpated.  EXTREMITIES:  Had no edema.   LABORATORY DATA:  As mentioned in the history of present illness.  His  creatinine was normal at 0.86.  Normal LFTs, normal bilirubin, normal  lipase.  LDH is currently pending.  His peripheral smear was personally  reviewed by me today and showed a wide variety of progenitor cells  including planocytes, monocytes, meta monocytes as well as 26%  neutrophils and certainly increased blasts at percentage.   IMPRESSION:  This is a pleasant 25 year old gentleman with the following  findings:  1.  Leukocytosis in the setting of a massive splenomegaly.      Differential diagnoses would include leukemia.  I think that      looking at the peripheral smears, a lot of element of chronic      features, though chronic myelogenous leukemia is high on the      differential.  Certainly a leukemic phase lymphoma could be a      possibility and an acute leukemia is a possibility but also less      likely.  Also a blast phase of  chronic myelogenous leukemia could      also be a consideration.  2. Abdominal pain due to massive splenomegaly.  3. Anemia.   PLAN:  Admit him to [facility] for intravenous hydration, pain  control if needed.  Also send his peripheral blood for flow cytometry as  well as cytogenetics using fish to check for BCR able and probably a  bone marrow biopsy will be in order in the next 24-48 hours if no  definitive diagnosis is made.           ______________________________  Blenda Nicely [facility]  Electronically Signed     FNS/MEDQ  D:  08/12/2008  T:  08/12/2008  Job:  [id number]

## 2010-11-15 ENCOUNTER — Encounter (HOSPITAL_BASED_OUTPATIENT_CLINIC_OR_DEPARTMENT_OTHER): Payer: Managed Care, Other (non HMO) | Admitting: Oncology

## 2010-11-15 ENCOUNTER — Other Ambulatory Visit: Payer: Self-pay | Admitting: Medical

## 2010-11-15 DIAGNOSIS — C9210 Chronic myeloid leukemia, BCR/ABL-positive, not having achieved remission: Secondary | ICD-10-CM

## 2010-11-15 DIAGNOSIS — D696 Thrombocytopenia, unspecified: Secondary | ICD-10-CM

## 2010-11-15 LAB — COMPREHENSIVE METABOLIC PANEL
ALT: 48 U/L (ref 0–53)
AST: 22 U/L (ref 0–37)
Albumin: 4.5 g/dL (ref 3.5–5.2)
Calcium: 9.7 mg/dL (ref 8.4–10.5)
Chloride: 103 mEq/L (ref 96–112)
Creatinine, Ser: 1.06 mg/dL (ref 0.50–1.35)
Potassium: 3.9 mEq/L (ref 3.5–5.3)
Sodium: 138 mEq/L (ref 135–145)
Total Protein: 7.2 g/dL (ref 6.0–8.3)

## 2010-11-15 LAB — CBC WITH DIFFERENTIAL/PLATELET
BASO%: 0.7 % (ref 0.0–2.0)
Eosinophils Absolute: 0.6 10*3/uL — ABNORMAL HIGH (ref 0.0–0.5)
MCHC: 34.5 g/dL (ref 32.0–36.0)
MONO#: 0.6 10*3/uL (ref 0.1–0.9)
NEUT#: 12.8 10*3/uL — ABNORMAL HIGH (ref 1.5–6.5)
RBC: 5.02 10*6/uL (ref 4.20–5.82)
WBC: 16.8 10*3/uL — ABNORMAL HIGH (ref 4.0–10.3)
lymph#: 2.7 10*3/uL (ref 0.9–3.3)

## 2010-11-15 LAB — TECHNOLOGIST REVIEW

## 2011-02-15 ENCOUNTER — Other Ambulatory Visit: Payer: Self-pay | Admitting: Oncology

## 2011-02-15 ENCOUNTER — Encounter (HOSPITAL_BASED_OUTPATIENT_CLINIC_OR_DEPARTMENT_OTHER): Payer: Managed Care, Other (non HMO) | Admitting: Oncology

## 2011-02-15 DIAGNOSIS — D696 Thrombocytopenia, unspecified: Secondary | ICD-10-CM

## 2011-02-15 DIAGNOSIS — C9211 Chronic myeloid leukemia, BCR/ABL-positive, in remission: Secondary | ICD-10-CM

## 2011-02-15 DIAGNOSIS — C9210 Chronic myeloid leukemia, BCR/ABL-positive, not having achieved remission: Secondary | ICD-10-CM

## 2011-02-15 LAB — TECHNOLOGIST REVIEW: Technologist Review: 1

## 2011-02-15 LAB — CBC WITH DIFFERENTIAL/PLATELET
BASO%: 0 % (ref 0.0–2.0)
EOS%: 1.4 % (ref 0.0–7.0)
HCT: 42.2 % (ref 38.4–49.9)
LYMPH%: 5.2 % — ABNORMAL LOW (ref 14.0–49.0)
MCH: 30.6 pg (ref 27.2–33.4)
MCHC: 34.3 g/dL (ref 32.0–36.0)
MONO#: 2.2 10*3/uL — ABNORMAL HIGH (ref 0.1–0.9)
MONO%: 1.9 % (ref 0.0–14.0)
NEUT%: 91.5 % — ABNORMAL HIGH (ref 39.0–75.0)
Platelets: 374 10*3/uL (ref 140–400)
RBC: 4.74 10*6/uL (ref 4.20–5.82)
WBC: 112.1 10*3/uL (ref 4.0–10.3)

## 2011-02-15 LAB — COMPREHENSIVE METABOLIC PANEL
ALT: 49 U/L (ref 0–53)
AST: 25 U/L (ref 0–37)
Alkaline Phosphatase: 71 U/L (ref 39–117)
BUN: 17 mg/dL (ref 6–23)
Calcium: 9.6 mg/dL (ref 8.4–10.5)
Chloride: 104 mEq/L (ref 96–112)
Creatinine, Ser: 1.1 mg/dL (ref 0.50–1.35)
Total Bilirubin: 0.8 mg/dL (ref 0.3–1.2)

## 2011-03-15 ENCOUNTER — Other Ambulatory Visit (HOSPITAL_BASED_OUTPATIENT_CLINIC_OR_DEPARTMENT_OTHER): Payer: Managed Care, Other (non HMO)

## 2011-03-15 ENCOUNTER — Other Ambulatory Visit: Payer: Self-pay | Admitting: Oncology

## 2011-03-15 DIAGNOSIS — E039 Hypothyroidism, unspecified: Secondary | ICD-10-CM

## 2011-03-15 DIAGNOSIS — C9211 Chronic myeloid leukemia, BCR/ABL-positive, in remission: Secondary | ICD-10-CM

## 2011-03-15 DIAGNOSIS — D696 Thrombocytopenia, unspecified: Secondary | ICD-10-CM

## 2011-03-15 LAB — COMPREHENSIVE METABOLIC PANEL
AST: 19 U/L (ref 0–37)
Alkaline Phosphatase: 62 U/L (ref 39–117)
BUN: 13 mg/dL (ref 6–23)
Calcium: 9.5 mg/dL (ref 8.4–10.5)
Creatinine, Ser: 1.07 mg/dL (ref 0.50–1.35)

## 2011-03-15 LAB — CBC WITH DIFFERENTIAL/PLATELET
BASO%: 0.2 % (ref 0.0–2.0)
EOS%: 1.2 % (ref 0.0–7.0)
HCT: 42 % (ref 38.4–49.9)
LYMPH%: 8.6 % — ABNORMAL LOW (ref 14.0–49.0)
MCH: 30.7 pg (ref 27.2–33.4)
MCHC: 33.9 g/dL (ref 32.0–36.0)
MONO%: 1.3 % (ref 0.0–14.0)
NEUT%: 88.7 % — ABNORMAL HIGH (ref 39.0–75.0)
Platelets: 276 10*3/uL (ref 140–400)

## 2011-05-12 ENCOUNTER — Telehealth: Payer: Self-pay | Admitting: Oncology

## 2011-05-12 NOTE — Telephone Encounter (Signed)
S/w pt, gave appt 07/05/11 @ 10am.

## 2011-06-27 ENCOUNTER — Encounter: Payer: Self-pay | Admitting: *Deleted

## 2011-06-29 ENCOUNTER — Ambulatory Visit: Payer: Managed Care, Other (non HMO) | Admitting: Oncology

## 2011-06-29 ENCOUNTER — Other Ambulatory Visit: Payer: Managed Care, Other (non HMO) | Admitting: Lab

## 2011-07-05 ENCOUNTER — Telehealth: Payer: Self-pay | Admitting: Oncology

## 2011-07-05 ENCOUNTER — Other Ambulatory Visit (HOSPITAL_BASED_OUTPATIENT_CLINIC_OR_DEPARTMENT_OTHER): Payer: Managed Care, Other (non HMO)

## 2011-07-05 ENCOUNTER — Ambulatory Visit (HOSPITAL_BASED_OUTPATIENT_CLINIC_OR_DEPARTMENT_OTHER): Payer: Managed Care, Other (non HMO) | Admitting: Oncology

## 2011-07-05 VITALS — BP 130/82 | HR 82 | Temp 97.0°F | Ht 70.0 in | Wt 193.8 lb

## 2011-07-05 DIAGNOSIS — D72829 Elevated white blood cell count, unspecified: Secondary | ICD-10-CM

## 2011-07-05 DIAGNOSIS — D696 Thrombocytopenia, unspecified: Secondary | ICD-10-CM

## 2011-07-05 DIAGNOSIS — E039 Hypothyroidism, unspecified: Secondary | ICD-10-CM

## 2011-07-05 DIAGNOSIS — C9210 Chronic myeloid leukemia, BCR/ABL-positive, not having achieved remission: Secondary | ICD-10-CM

## 2011-07-05 DIAGNOSIS — D759 Disease of blood and blood-forming organs, unspecified: Secondary | ICD-10-CM

## 2011-07-05 LAB — COMPREHENSIVE METABOLIC PANEL
AST: 20 U/L (ref 0–37)
Albumin: 4.5 g/dL (ref 3.5–5.2)
Alkaline Phosphatase: 69 U/L (ref 39–117)
BUN: 17 mg/dL (ref 6–23)
Potassium: 4 mEq/L (ref 3.5–5.3)

## 2011-07-05 LAB — CBC WITH DIFFERENTIAL/PLATELET
BASO%: 0 % (ref 0.0–2.0)
EOS%: 0.6 % (ref 0.0–7.0)
MCH: 30.1 pg (ref 27.2–33.4)
MCHC: 33.5 g/dL (ref 32.0–36.0)
NEUT%: 95.6 % — ABNORMAL HIGH (ref 39.0–75.0)
RDW: 19.3 % — ABNORMAL HIGH (ref 11.0–14.6)
lymph#: 4.6 10*3/uL — ABNORMAL HIGH (ref 0.9–3.3)

## 2011-07-05 NOTE — Telephone Encounter (Signed)
Gv pt appt for may2013 

## 2011-07-05 NOTE — Progress Notes (Signed)
Hematology and Oncology Follow Up Visit  Nathan Kerr [id number] 17-Nov-1985 26 y.o. 07/05/2011 10:49 AM   CC: Nathan Kerr A. Nathan Kerr, M.D.    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   Prior Therapy:  1. Patient treated with Gleevec 400 mg daily between April of 2010 until September of 2010. 2.     Patient treated with Gleevec 200 mg daily due to severe thrombocytopenia and subsequently switched to Tasigna for lack of efficacy and poor tolerance.   Current therapy: He is on Tasigna 300 mg tablet b.i.d., total 600 mg daily, started in August of 2011.  He had a complete hematological remission and near molecular remission at this time.  Interim History: Nathan Kerr presents today for a followup visit.  He is a very pleasant gentleman with chronic phase CML.  He has tolerated Tasigna fairly well.  Did have problems with mild fatigue, about grade 1.  He had also some weight gain associated with it as well.  Unfortunately, Nathan Kerr is very erratic in taking his Tasigna.  He reports that he has not been on it for the last few week or so, and again, he has also been noncompliant at times about forgetting doses at times, but otherwise he feels relatively energetic.  He had not reported any fevers.  Had not reported any chills.  Had not reported any major changes in his performance status.  Had not had any weight loss.    Medications: I have reviewed the patient's current medications. Current outpatient prescriptions:nilotinib (TASIGNA) 150 MG capsule, Take 300 mg by mouth every 12 (twelve) hours., Disp: , Rfl:   Allergies: No Known Allergies  Past Medical History, Surgical history, Social history, and Family History were reviewed and updated.  Review of Systems: Constitutional:  Negative for fever, chills, night sweats, anorexia, weight loss, pain. Cardiovascular: no chest pain or dyspnea on exertion Respiratory: no  cough, shortness of breath, or wheezing Neurological: no TIA or stroke symptoms Dermatological: negative ENT: negative Skin: Negative. Gastrointestinal: no abdominal pain, change in bowel habits, or black or bloody stools Genito-Urinary: no dysuria, trouble voiding, or hematuria Hematological and Lymphatic: negative Breast: negative Musculoskeletal: negative Remaining ROS negative. Physical Exam: Blood pressure 130/82, pulse 82, temperature 97 F (36.1 C), temperature source Oral, height 5\' 10"  (1.778 m), weight 193 lb 12.8 oz (87.907 kg). ECOG: 0 General appearance: alert Head: Normocephalic, without obvious abnormality, atraumatic Neck: no adenopathy, no carotid bruit, no JVD, supple, symmetrical, trachea midline and thyroid not enlarged, symmetric, no tenderness/mass/nodules Lymph nodes: Cervical, supraclavicular, and axillary nodes normal. Heart:regular rate and rhythm, S1, S2 normal, no murmur, click, rub or gallop Lung:chest clear, no wheezing, rales, normal symmetric air entry Abdomin: soft, non-tender, without masses or organomegaly EXT:no erythema, induration, or nodules   Lab Results: Lab Results  Component Value Date   WBC 136.8* 07/05/2011   HGB 11.9* 07/05/2011   HCT 35.5* 07/05/2011   MCV 89.9 07/05/2011   PLT 370 07/05/2011     Chemistry      Component Value Date/Time   NA 137 03/15/2011 1403   K 3.8 03/15/2011 1403   CL 104 03/15/2011 1403   CO2 22 03/15/2011 1403   BUN 13 03/15/2011 1403   CREATININE 1.07 03/15/2011 1403      Component Value Date/Time   CALCIUM 9.5 03/15/2011 1403   ALKPHOS 62 03/15/2011 1403   AST 19 03/15/2011 1403   ALT 35 03/15/2011 1403  BILITOT 1.1 03/15/2011 1403      Impression and Plan:  26 year old gentleman with the following issues: 1. Chronic phase chronic myelogenous leukemia.  He has a rather rapid increase in the last 3 months of his blood counts up to 136,000.  I wonder if he is developing resistance to Tasigna versus due to  lack of compliance at this time.  He had a similar episode in 02/2011, and once he started taking his medication his WBC dropped down from 112 to 30K. I continued to emphasize  the importance of adherence at this time. I will also explore the option of possible switching to dasatinib due to once a day dosing and may be less financial burden.  2. Weight gain seems to have stabilized at this time. 3. Cytopenia.  I have discussed with Nathan Kerr instructions about reporting to me any constitutional symptoms that he might be reporting or developing, such as fevers, chills, abdominal pain, splenomegaly, may be signs that he may be transforming at this time, but he has none of that, so we will continue to monitor him closely at this time.  [facility], MD 2/27/201310:49 AM

## 2011-07-13 ENCOUNTER — Encounter: Payer: Self-pay | Admitting: Oncology

## 2011-07-13 NOTE — Progress Notes (Signed)
Faxed form back to [facility] @ [employer].

## 2011-07-26 ENCOUNTER — Ambulatory Visit (INDEPENDENT_AMBULATORY_CARE_PROVIDER_SITE_OTHER): Payer: Managed Care, Other (non HMO) | Admitting: Internal Medicine

## 2011-07-26 VITALS — BP 137/84 | HR 74 | Temp 98.7°F | Resp 16 | Ht 70.5 in | Wt 195.8 lb

## 2011-07-26 DIAGNOSIS — J209 Acute bronchitis, unspecified: Secondary | ICD-10-CM

## 2011-07-26 DIAGNOSIS — J40 Bronchitis, not specified as acute or chronic: Secondary | ICD-10-CM

## 2011-07-26 DIAGNOSIS — J019 Acute sinusitis, unspecified: Secondary | ICD-10-CM

## 2011-07-26 MED ORDER — BENZONATATE 200 MG PO CAPS: 200.0000 mg | ORAL_CAPSULE | Freq: Three times a day (TID) | ORAL | Status: AC | PRN

## 2011-07-26 MED ORDER — CEFDINIR 300 MG PO CAPS: 300.0000 mg | ORAL_CAPSULE | Freq: Two times a day (BID) | ORAL | Status: AC

## 2011-07-26 NOTE — Patient Instructions (Signed)
Take all of your Cefdinir as prescribed for bronchitis and sinusitis.  Use the [employer] as needed for your cough.  Get extra fluids and rest.  Return to our clinic if not improving in 3 days.  Sinusitis Sinuses are air pockets within the bones of your face. The growth of bacteria within a sinus leads to infection. The infection prevents the sinuses from draining. This infection is called sinusitis. SYMPTOMS  There will be different areas of pain depending on which sinuses have become infected.  The maxillary sinuses often produce pain beneath the eyes.   Frontal sinusitis may cause pain in the middle of the forehead and above the eyes.  Other problems (symptoms) include:  Toothaches.   Colored, pus-like (purulent) drainage from the nose.   Swelling, warmth, and tenderness over the sinus areas may be signs of infection.  TREATMENT  Sinusitis is most often determined by an exam.X-rays may be taken. If x-rays have been taken, make sure you obtain your results or find out how you are to obtain them. Your caregiver may give you medications (antibiotics). These are medications that will help kill the bacteria causing the infection. You may also be given a medication (decongestant) that helps to reduce sinus swelling.  HOME CARE INSTRUCTIONS   Only take over-the-counter or prescription medicines for pain, discomfort, or fever as directed by your caregiver.   Drink extra fluids. Fluids help thin the mucus so your sinuses can drain more easily.   Applying either moist heat or ice packs to the sinus areas may help relieve discomfort.   Use saline nasal sprays to help moisten your sinuses. The sprays can be found at your local drugstore.  SEEK IMMEDIATE MEDICAL CARE IF:  You have a fever.   You have increasing pain, severe headaches, or toothache.   You have nausea, vomiting, or drowsiness.   You develop unusual swelling around the face or trouble seeing.  MAKE SURE YOU:    Understand these instructions.   Will watch your condition.   Will get help right away if you are not doing well or get worse.  Document Released: 04/24/2005 Document Revised: 04/13/2011 Document Reviewed: 11/21/2006 [facility] Patient Information 2012 [place], [place].

## 2011-07-26 NOTE — Progress Notes (Signed)
  Subjective:    Patient ID: Nathan Kerr, male    DOB: 02/16/1986, 26 y.o.   MRN: [id number]  Sinusitis This is a new problem. The current episode started 1 to 4 weeks ago. The problem has been gradually worsening since onset. There has been no fever. The pain is moderate. Associated symptoms include congestion, coughing, headaches, a hoarse voice and sinus pressure. Pertinent negatives include no shortness of breath or sore throat. Past treatments include oral decongestants. The treatment provided no relief.  Cough This is a new problem. The current episode started in the past 7 days. The problem has been gradually worsening. The problem occurs every few hours. The cough is productive of sputum. Associated symptoms include headaches, nasal congestion and postnasal drip. Pertinent negatives include no chest pain, sore throat or shortness of breath. He has tried OTC cough suppressant for the symptoms. His past medical history is significant for asthma.  [name] is a 26 year old male who works in [employer] who comes in today complaining of URI two weeks ago that will not go away.  He is complaining of this PND and headaches everyday for the past 7 days.  His cough has worsened over the past 3 days which is productive at times, no frank wheezing but he is coughing a lot at night.    Review of Systems  Constitutional: Negative.   HENT: Positive for congestion, hoarse voice, postnasal drip and sinus pressure. Negative for sore throat.   Eyes: Negative.   Respiratory: Positive for cough. Negative for shortness of breath.   Cardiovascular: Negative for chest pain.  Genitourinary: Negative.   Musculoskeletal: Negative.   Neurological: Positive for headaches.  Hematological: Negative.   Psychiatric/Behavioral: Negative.        Objective:   Physical Exam  Vitals reviewed. Constitutional: He appears well-nourished.  HENT:  Head: Normocephalic and atraumatic.  Right Ear: External ear  normal.  Left Ear: External ear normal.       Thick yellow drainage noted in posterior pharynx  Neck: Neck supple.  Cardiovascular: Normal rate, regular rhythm and normal heart sounds.   Pulmonary/Chest: Breath sounds normal.  Abdominal: Soft.  Lymphadenopathy:    He has no cervical adenopathy.  Skin: Skin is warm and dry.  Psychiatric: He has a normal mood and affect. His behavior is normal.          Assessment & Plan:  Sinusitis with early bronchitis:  Due to his med for CML in remission we will place him on Cefdinir 300 mg BID for 10 days.  TEssalon Perles 200 mg TID prn.  Fluids, rest, OOW note written for tomorrow, return Friday if feeling better.  AVS printed and given to pt.

## 2011-09-07 NOTE — Progress Notes (Signed)
Patient called concerning co-pay assistance for Tasigna ($600 monthly); collaborated with Meriam Sprague and patient to bring recent check stub, bank statement and needs to sign form for assistance; patient contacted and aware.

## 2011-09-08 ENCOUNTER — Telehealth: Payer: Self-pay | Admitting: Oncology

## 2011-09-12 ENCOUNTER — Other Ambulatory Visit: Payer: Self-pay | Admitting: *Deleted

## 2011-09-12 MED ORDER — NILOTINIB HCL 150 MG PO CAPS: 300.0000 mg | ORAL_CAPSULE | Freq: Two times a day (BID) | ORAL | Status: DC

## 2011-09-13 ENCOUNTER — Encounter: Payer: Self-pay | Admitting: Oncology

## 2011-09-13 NOTE — Progress Notes (Signed)
Fax information to [employer] for co-pay assistance for Tasigna,fax #[phone].

## 2011-10-04 ENCOUNTER — Telehealth: Payer: Self-pay | Admitting: Oncology

## 2011-10-04 ENCOUNTER — Ambulatory Visit (HOSPITAL_BASED_OUTPATIENT_CLINIC_OR_DEPARTMENT_OTHER): Payer: Managed Care, Other (non HMO) | Admitting: Oncology

## 2011-10-04 ENCOUNTER — Other Ambulatory Visit (HOSPITAL_BASED_OUTPATIENT_CLINIC_OR_DEPARTMENT_OTHER): Payer: Managed Care, Other (non HMO)

## 2011-10-04 VITALS — BP 123/77 | HR 99 | Temp 99.3°F | Ht 70.5 in | Wt 187.9 lb

## 2011-10-04 DIAGNOSIS — C9210 Chronic myeloid leukemia, BCR/ABL-positive, not having achieved remission: Secondary | ICD-10-CM

## 2011-10-04 DIAGNOSIS — D696 Thrombocytopenia, unspecified: Secondary | ICD-10-CM

## 2011-10-04 LAB — MANUAL DIFFERENTIAL
Band Neutrophils: 26 % — ABNORMAL HIGH (ref 0–10)
Basophil: 3 % — ABNORMAL HIGH (ref 0–2)
EOS: 1 % (ref 0–7)
LYMPH: 2 % — ABNORMAL LOW (ref 14–49)
MONO: 1 % (ref 0–14)
nRBC: 1 % — ABNORMAL HIGH (ref 0–0)

## 2011-10-04 LAB — COMPREHENSIVE METABOLIC PANEL
Alkaline Phosphatase: 69 U/L (ref 39–117)
BUN: 12 mg/dL (ref 6–23)
Creatinine, Ser: 1.16 mg/dL (ref 0.50–1.35)
Glucose, Bld: 102 mg/dL — ABNORMAL HIGH (ref 70–99)
Sodium: 141 mEq/L (ref 135–145)
Total Bilirubin: 0.7 mg/dL (ref 0.3–1.2)

## 2011-10-04 LAB — CBC WITH DIFFERENTIAL/PLATELET
HCT: 39.8 % (ref 38.4–49.9)
MCH: 28.7 pg (ref 27.2–33.4)
MCHC: 33.9 g/dL (ref 32.0–36.0)
MCV: 84.7 fL (ref 79.3–98.0)
Platelets: 413 10*3/uL — ABNORMAL HIGH (ref 140–400)

## 2011-10-04 NOTE — Progress Notes (Signed)
Hematology and Oncology Follow Up Visit  Nathan Kerr [id number] 29-Jun-1985 26 y.o. 10/04/2011 1:51 PM   CC: Nathan Kerr [name], M.D.    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   Prior Therapy:  1. Patient treated with Gleevec 400 mg daily between April of 2010 until September of 2010. 2.     Patient treated with Gleevec 200 mg daily due to severe thrombocytopenia and subsequently switched to Tasigna for lack of efficacy and poor tolerance.   Current therapy: He is on Tasigna 300 mg tablet b.i.d., total 600 mg daily, started in August of 2011.  He had a complete hematological remission and near molecular remission at this time. Recently WBC is rising.   Interim History: [name] presents today for a followup visit.  He is a very pleasant gentleman with chronic phase CML.  He has tolerated Tasigna fairly well.  Did have problems with mild fatigue, about grade 1.  He had also some weight gain associated with it as well.  Unfortunately, [name] is very erratic in taking his Tasigna.  He reports that he has not been on it for the last few week or so, and again, he has also been noncompliant at times about forgetting doses at times, but otherwise he feels relatively energetic.  He had not reported any fevers.  Had not reported any chills.  Had not reported any major changes in his performance status.  Had not had any weight loss. He is also having financial hurdles of getting the medication.   Medications: I have reviewed the patient's current medications. Current outpatient prescriptions:Multiple Vitamin (MULTIVITAMIN) tablet, Take 1 tablet by mouth daily., Disp: , Rfl: ;  vitamin B-12 (CYANOCOBALAMIN) 100 MCG tablet, Take 50 mcg by mouth daily., Disp: , Rfl: ;  nilotinib (TASIGNA) 150 MG capsule, Take 2 capsules (300 mg total) by mouth every 12 (twelve) hours., Disp: 120 capsule, Rfl: 0  Allergies: No Known  Allergies  Past Medical History, Surgical history, Social history, and Family History were reviewed and updated.  Review of Systems: Constitutional:  Negative for fever, chills, night sweats, anorexia, weight loss, pain. Cardiovascular: no chest pain or dyspnea on exertion Respiratory: no cough, shortness of breath, or wheezing Neurological: no TIA or stroke symptoms Dermatological: negative ENT: negative Skin: Negative. Gastrointestinal: no abdominal pain, change in bowel habits, or black or bloody stools Genito-Urinary: no dysuria, trouble voiding, or hematuria Hematological and Lymphatic: negative Breast: negative Musculoskeletal: negative Remaining ROS negative. Physical Exam: Blood pressure 123/77, pulse 99, temperature 99.3 F (37.4 C), temperature source Oral, height 5' 10.5" (1.791 m), weight 187 lb 14.4 oz (85.231 kg). ECOG: 0 General appearance: alert Head: Normocephalic, without obvious abnormality, atraumatic Neck: no adenopathy, no carotid bruit, no JVD, supple, symmetrical, trachea midline and thyroid not enlarged, symmetric, no tenderness/mass/nodules Lymph nodes: Cervical, supraclavicular, and axillary nodes normal. Heart:regular rate and rhythm, S1, S2 normal, no murmur, click, rub or gallop Lung:chest clear, no wheezing, rales, normal symmetric air entry Abdomin: soft, non-tender, without masses or organomegaly EXT:no erythema, induration, or nodules   Lab Results: Lab Results  Component Value Date   WBC 200.2* 10/04/2011   HGB 13.5 10/04/2011   HCT 39.8 10/04/2011   MCV 84.7 10/04/2011   PLT 413* 10/04/2011     Chemistry      Component Value Date/Time   NA 141 07/05/2011 1007   K 4.0 07/05/2011 1007   CL 106 07/05/2011 1007  CO2 23 07/05/2011 1007   BUN 17 07/05/2011 1007   CREATININE 1.05 07/05/2011 1007      Component Value Date/Time   CALCIUM 9.4 07/05/2011 1007   ALKPHOS 69 07/05/2011 1007   AST 20 07/05/2011 1007   ALT 27 07/05/2011 1007   BILITOT 0.8  07/05/2011 1007      Impression and Plan:  26 year old gentleman with the following issues: 1. Chronic phase chronic myelogenous leukemia.  He has a rather rapid increase in the last 3 months of his blood counts up to 200 K  I wonder if he is developing resistance to Tasigna versus due to lack of compliance at this time.  He had a similar episode in 02/2011, and once he started taking his medication his WBC dropped down from 112 to 30K. I continued to emphasize  the importance of adherence at this time. I will also explore the option of possible switching to dasatinib due to once a day dosing and may be less financial burden.  2. Weight gain seems to have stabilized at this time. 3. Cytopenia.  Stable a this point.   Nathan Hose, MD 5/29/20131:51 PM

## 2011-10-04 NOTE — Telephone Encounter (Signed)
Gave pt appt for June 2013 lab and MD

## 2011-10-06 ENCOUNTER — Encounter: Payer: Self-pay | Admitting: Oncology

## 2011-10-06 NOTE — Progress Notes (Signed)
I spoke with the patient on yesterday and I told him to call the company and the pharmacy,because they needed his updated insurance card.I also gave him the phone number to the pharmacy to call and he said he would call them.

## 2011-10-17 ENCOUNTER — Other Ambulatory Visit: Payer: Self-pay | Admitting: *Deleted

## 2011-10-17 NOTE — Telephone Encounter (Signed)
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 

## 2011-10-19 ENCOUNTER — Encounter: Payer: Self-pay | Admitting: Oncology

## 2011-10-19 NOTE — Progress Notes (Signed)
Fax prescription to [facility] ,then I contact the patient to let him know that I did fax his prescription and he said that the pharmacy has contact him.

## 2011-10-27 ENCOUNTER — Telehealth: Payer: Self-pay | Admitting: Oncology

## 2011-10-27 ENCOUNTER — Ambulatory Visit (HOSPITAL_BASED_OUTPATIENT_CLINIC_OR_DEPARTMENT_OTHER): Payer: Managed Care, Other (non HMO) | Admitting: Oncology

## 2011-10-27 ENCOUNTER — Other Ambulatory Visit (HOSPITAL_BASED_OUTPATIENT_CLINIC_OR_DEPARTMENT_OTHER): Payer: Managed Care, Other (non HMO)

## 2011-10-27 VITALS — BP 126/81 | HR 83 | Temp 97.4°F | Ht 70.5 in | Wt 187.5 lb

## 2011-10-27 DIAGNOSIS — C9210 Chronic myeloid leukemia, BCR/ABL-positive, not having achieved remission: Secondary | ICD-10-CM | POA: Insufficient documentation

## 2011-10-27 LAB — COMPREHENSIVE METABOLIC PANEL
ALT: 21 U/L (ref 0–53)
AST: 13 U/L (ref 0–37)
CO2: 23 mEq/L (ref 19–32)
Chloride: 106 mEq/L (ref 96–112)
Sodium: 139 mEq/L (ref 135–145)
Total Bilirubin: 1 mg/dL (ref 0.3–1.2)
Total Protein: 6.7 g/dL (ref 6.0–8.3)

## 2011-10-27 LAB — CBC WITH DIFFERENTIAL/PLATELET
BASO%: 0.3 % (ref 0.0–2.0)
LYMPH%: 10.8 % — ABNORMAL LOW (ref 14.0–49.0)
MCHC: 33 g/dL (ref 32.0–36.0)
MONO#: 0.3 10*3/uL (ref 0.1–0.9)
RBC: 4.61 10*6/uL (ref 4.20–5.82)
WBC: 16.1 10*3/uL — ABNORMAL HIGH (ref 4.0–10.3)
lymph#: 1.7 10*3/uL (ref 0.9–3.3)

## 2011-10-27 LAB — TECHNOLOGIST REVIEW

## 2011-10-27 NOTE — Telephone Encounter (Signed)
Gave pt appt for August 2013 lab and MD °

## 2011-10-27 NOTE — Progress Notes (Signed)
Hematology and Oncology Follow Up Visit  Nathan Kerr [id number] 07/03/1985 26 y.o. 10/27/2011 10:53 AM   CC: Nathan Kerr [name], M.D.    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   Prior Therapy:  1. Patient treated with Gleevec 400 mg daily between April of 2010 until September of 2010. 2.     Patient treated with Gleevec 200 mg daily due to severe thrombocytopenia and subsequently switched to Tasigna for lack of efficacy and poor tolerance.   Current therapy: He is on Tasigna 300 mg tablet b.i.d., total 600 mg daily, started in August of 2011.  He had a complete hematological remission and near molecular remission at this time. Recently WBC is rising.   Interim History: Nathan Kerr presents today for a followup visit.  He is a very pleasant gentleman with chronic phase CML.  He has tolerated Tasigna fairly well.  Did have problems with mild fatigue, about grade 1.Nathan Kerr is doing a lot better job in taking his Tasigna.  He reports that he has  on it for the last few week but otherwise he feels relatively energetic.  He had not reported any fevers.  Had not reported any chills.  Had not reported any major changes in his performance status.  Had not had any weight loss. He is also having financial hurdles of getting the medication.   Medications: I have reviewed the patient's current medications. Current outpatient prescriptions:Multiple Vitamin (MULTIVITAMIN) tablet, Take 1 tablet by mouth daily., Disp: , Rfl: ;  nilotinib (TASIGNA) 150 MG capsule, Take 2 capsules (300 mg total) by mouth every 12 (twelve) hours., Disp: 120 capsule, Rfl: 0;  vitamin B-12 (CYANOCOBALAMIN) 100 MCG tablet, Take 50 mcg by mouth daily., Disp: , Rfl:   Allergies: No Known Allergies  Past Medical History, Surgical history, Social history, and Family History were reviewed and updated.  Review of Systems: Constitutional:  Negative  for fever, chills, night sweats, anorexia, weight loss, pain. Cardiovascular: no chest pain or dyspnea on exertion Respiratory: no cough, shortness of breath, or wheezing Neurological: no TIA or stroke symptoms Dermatological: negative ENT: negative Skin: Negative. Gastrointestinal: no abdominal pain, change in bowel habits, or black or bloody stools Genito-Urinary: no dysuria, trouble voiding, or hematuria Hematological and Lymphatic: negative Breast: negative Musculoskeletal: negative Remaining ROS negative. Physical Exam: Blood pressure 126/81, pulse 83, temperature 97.4 F (36.3 C), temperature source Oral, height 5' 10.5" (1.791 m), weight 187 lb 8 oz (85.049 kg). ECOG: 0 General appearance: alert Head: Normocephalic, without obvious abnormality, atraumatic Neck: no adenopathy, no carotid bruit, no JVD, supple, symmetrical, trachea midline and thyroid not enlarged, symmetric, no tenderness/mass/nodules Lymph nodes: Cervical, supraclavicular, and axillary nodes normal. Heart:regular rate and rhythm, S1, S2 normal, no murmur, click, rub or gallop Lung:chest clear, no wheezing, rales, normal symmetric air entry Abdomin: soft, non-tender, without masses or organomegaly EXT:no erythema, induration, or nodules   Lab Results: Lab Results  Component Value Date   WBC 16.1* 10/27/2011   HGB 12.9* 10/27/2011   HCT 39.2 10/27/2011   MCV 84.9 10/27/2011   PLT 258 10/27/2011     Chemistry      Component Value Date/Time   NA 141 10/04/2011 1316   K 3.9 10/04/2011 1316   CL 106 10/04/2011 1316   CO2 25 10/04/2011 1316   BUN 12 10/04/2011 1316   CREATININE 1.16 10/04/2011 1316      Component Value Date/Time   CALCIUM  9.6 10/04/2011 1316   ALKPHOS 69 10/04/2011 1316   AST 22 10/04/2011 1316   ALT 26 10/04/2011 1316   BILITOT 0.7 10/04/2011 1316      Impression and Plan:  26 year old gentleman with the following issues: 1. Chronic phase chronic myelogenous leukemia.  His counts rapidly  improved with more strict adherence to the medication (WBC down from 200 K to 16 K). The plan is to continue on the current dose and same medication 2. Weight gain seems to have stabilized at this time. 3. Cytopenia.  Stable a this point.   [facility], MD 6/21/201310:53 AM

## 2011-12-20 ENCOUNTER — Telehealth: Payer: Self-pay | Admitting: *Deleted

## 2011-12-20 NOTE — Telephone Encounter (Signed)
Fax communication received from [facility] that they have been trying to reach pt. Unsuccessfully after several attempts.  Asking for update on patient therapy or an updated contact number.  Called patient, informed him of this.  "Oh that's who that was."  Gave phone number to call [facility] to arrange for refill deliveray.

## 2011-12-21 ENCOUNTER — Telehealth: Payer: Self-pay | Admitting: Oncology

## 2011-12-21 NOTE — Telephone Encounter (Signed)
S/w pt re new appt time for 8/21. appt moved to [facility] due to FS vac.

## 2011-12-27 ENCOUNTER — Telehealth: Payer: Self-pay | Admitting: Oncology

## 2011-12-27 ENCOUNTER — Other Ambulatory Visit: Payer: Managed Care, Other (non HMO)

## 2011-12-27 ENCOUNTER — Other Ambulatory Visit (HOSPITAL_BASED_OUTPATIENT_CLINIC_OR_DEPARTMENT_OTHER): Payer: Managed Care, Other (non HMO) | Admitting: Lab

## 2011-12-27 ENCOUNTER — Ambulatory Visit: Payer: Managed Care, Other (non HMO) | Admitting: Oncology

## 2011-12-27 ENCOUNTER — Ambulatory Visit (HOSPITAL_BASED_OUTPATIENT_CLINIC_OR_DEPARTMENT_OTHER): Payer: Managed Care, Other (non HMO) | Admitting: Oncology

## 2011-12-27 ENCOUNTER — Encounter: Payer: Self-pay | Admitting: Oncology

## 2011-12-27 VITALS — BP 125/90 | Temp 98.6°F | Resp 20 | Ht 70.5 in | Wt 200.8 lb

## 2011-12-27 DIAGNOSIS — C9210 Chronic myeloid leukemia, BCR/ABL-positive, not having achieved remission: Secondary | ICD-10-CM

## 2011-12-27 DIAGNOSIS — D759 Disease of blood and blood-forming organs, unspecified: Secondary | ICD-10-CM

## 2011-12-27 LAB — CBC WITH DIFFERENTIAL/PLATELET
BASO%: 4 % — ABNORMAL HIGH (ref 0.0–2.0)
EOS%: 2.8 % (ref 0.0–7.0)
HCT: 45.5 % (ref 38.4–49.9)
LYMPH%: 20.3 % (ref 14.0–49.0)
MCH: 28.1 pg (ref 27.2–33.4)
MCHC: 34.7 g/dL (ref 32.0–36.0)
MCV: 80.8 fL (ref 79.3–98.0)
MONO%: 3.2 % (ref 0.0–14.0)
NEUT%: 69.7 % (ref 39.0–75.0)
lymph#: 1.8 10*3/uL (ref 0.9–3.3)

## 2011-12-27 LAB — COMPREHENSIVE METABOLIC PANEL
ALT: 34 U/L (ref 0–53)
AST: 16 U/L (ref 0–37)
Alkaline Phosphatase: 49 U/L (ref 39–117)
BUN: 12 mg/dL (ref 6–23)
Chloride: 104 mEq/L (ref 96–112)
Creatinine, Ser: 0.96 mg/dL (ref 0.50–1.35)
Total Bilirubin: 1 mg/dL (ref 0.3–1.2)

## 2011-12-27 NOTE — Telephone Encounter (Signed)
Gave pt appt for November 2014 lab and MD °

## 2011-12-27 NOTE — Progress Notes (Signed)
Hematology and Oncology Follow Up Visit  Nathan Kerr [id number] 1986-03-13 26 y.o. 12/27/2011 1:27 PM   CC: [name], M.D.    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   Prior Therapy:  1. Patient treated with Gleevec 400 mg daily between April of 2010 until September of 2010. 2.     Patient treated with Gleevec 200 mg daily due to severe thrombocytopenia and subsequently switched to Tasigna for lack of efficacy and poor tolerance.   Current therapy: He is on Tasigna 300 mg tablet b.i.d., total 600 mg daily, started in August of 2011.  He had a complete hematological remission and near molecular remission at this time. Recently WBC is rising.   Interim History: Nathan Kerr presents today for a followup visit.  He is a very pleasant gentleman with chronic phase CML.  He has tolerated Tasigna fairly well.  Did have problems with mild fatigue, about grade 1. He is able to work full-time. Nathan Kerr is doing a lot better job in taking his Tasigna.  He reports that he has  on it for the last few week but otherwise he feels relatively energetic.  He had not reported any fevers.  Had not reported any chills.  Had not reported any major changes in his performance status.  Had not had any weight loss.   Medications: I have reviewed the patient's current medications. Current outpatient prescriptions:Multiple Vitamin (MULTIVITAMIN) tablet, Take 1 tablet by mouth daily., Disp: , Rfl: ;  nilotinib (TASIGNA) 150 MG capsule, Take 2 capsules (300 mg total) by mouth every 12 (twelve) hours., Disp: 120 capsule, Rfl: 0;  vitamin B-12 (CYANOCOBALAMIN) 100 MCG tablet, Take 50 mcg by mouth daily., Disp: , Rfl:   Allergies: No Known Allergies  Past Medical History, Surgical history, Social history, and Family History were reviewed and updated.  Review of Systems: Constitutional:  Negative for fever, chills, night sweats,  anorexia, weight loss, pain. Cardiovascular: no chest pain or dyspnea on exertion Respiratory: no cough, shortness of breath, or wheezing Neurological: no TIA or stroke symptoms Dermatological: negative ENT: negative Skin: Negative. Gastrointestinal: no abdominal pain, change in bowel habits, or black or bloody stools Genito-Urinary: no dysuria, trouble voiding, or hematuria Hematological and Lymphatic: negative Breast: negative Musculoskeletal: negative Remaining ROS negative.  Physical Exam: Blood pressure 125/90, temperature 98.6 F (37 C), temperature source Oral, resp. rate 20, height 5' 10.5" (1.791 m), weight 200 lb 12.8 oz (91.082 kg). ECOG: 0 General appearance: alert Head: Normocephalic, without obvious abnormality, atraumatic Neck: no adenopathy, no carotid bruit, no JVD, supple, symmetrical, trachea midline and thyroid not enlarged, symmetric, no tenderness/mass/nodules Lymph nodes: Cervical, supraclavicular, and axillary nodes normal. Heart:regular rate and rhythm, S1, S2 normal, no murmur, click, rub or gallop Lung:chest clear, no wheezing, rales, normal symmetric air entry Abdomen: soft, non-tender, without masses or organomegaly EXT:no erythema, induration, or nodules   Lab Results: Lab Results  Component Value Date   WBC 8.7 12/27/2011   HGB 15.8 12/27/2011   HCT 45.5 12/27/2011   MCV 80.8 12/27/2011   PLT 336 12/27/2011     Chemistry      Component Value Date/Time   NA 139 10/27/2011 1012   K 4.1 10/27/2011 1012   CL 106 10/27/2011 1012   CO2 23 10/27/2011 1012   BUN 11 10/27/2011 1012   CREATININE 0.85 10/27/2011 1012      Component Value Date/Time   CALCIUM 9.5  10/27/2011 1012   ALKPHOS 56 10/27/2011 1012   AST 13 10/27/2011 1012   ALT 21 10/27/2011 1012   BILITOT 1.0 10/27/2011 1012      Impression and Plan:  26 year old gentleman with the following issues: 1. Chronic phase chronic myelogenous leukemia.  His counts rapidly improved with more strict  adherence to the medication (WBC down from 200 K to 16 K and now normal ay 8.7). The plan is to continue on the current dose and same medication 2. Weight gain seems to have stabilized at this time. 3. Cytopenia.  Stable a this point.  4. Follow-up. In 3 months.  Clenton Pare 8/21/20131:27 PM

## 2012-03-28 ENCOUNTER — Ambulatory Visit: Payer: Managed Care, Other (non HMO) | Admitting: Oncology

## 2012-03-28 ENCOUNTER — Other Ambulatory Visit: Payer: Managed Care, Other (non HMO)

## 2012-07-05 ENCOUNTER — Telehealth: Payer: Self-pay | Admitting: Oncology

## 2012-07-05 NOTE — Telephone Encounter (Signed)
returned pt call and r/s missed appt for 4.16.14.Marland KitchenMarland Kitchenpt ok and aware

## 2012-08-12 ENCOUNTER — Telehealth: Payer: Self-pay | Admitting: Oncology

## 2012-08-21 ENCOUNTER — Other Ambulatory Visit: Payer: Managed Care, Other (non HMO) | Admitting: Lab

## 2012-08-21 ENCOUNTER — Ambulatory Visit: Payer: Managed Care, Other (non HMO) | Admitting: Oncology

## 2012-09-20 ENCOUNTER — Other Ambulatory Visit: Payer: Managed Care, Other (non HMO) | Admitting: Lab

## 2012-09-20 ENCOUNTER — Ambulatory Visit: Payer: Managed Care, Other (non HMO) | Admitting: Oncology

## 2012-10-01 ENCOUNTER — Encounter: Payer: Self-pay | Admitting: Oncology

## 2012-10-01 NOTE — Progress Notes (Signed)
[facility], [id number], approved tasigna 150mg  120 tabs from 09/01/12-10/01/13.

## 2012-10-02 ENCOUNTER — Telehealth: Payer: Self-pay | Admitting: *Deleted

## 2012-10-02 MED ORDER — NILOTINIB HCL 150 MG PO CAPS: 300.0000 mg | ORAL_CAPSULE | Freq: Two times a day (BID) | ORAL | Status: DC

## 2012-10-02 NOTE — Telephone Encounter (Signed)
Faxed script to [facility] for [place]

## 2012-10-23 ENCOUNTER — Encounter: Payer: Self-pay | Admitting: *Deleted

## 2012-10-23 NOTE — Progress Notes (Signed)
Gave diagnosis codes to specialty pharmacy.

## 2012-11-20 ENCOUNTER — Other Ambulatory Visit: Payer: Self-pay | Admitting: *Deleted

## 2012-11-20 DIAGNOSIS — C9210 Chronic myeloid leukemia, BCR/ABL-positive, not having achieved remission: Secondary | ICD-10-CM

## 2012-11-20 MED ORDER — NILOTINIB HCL 150 MG PO CAPS: 300.0000 mg | ORAL_CAPSULE | Freq: Two times a day (BID) | ORAL | Status: DC

## 2012-11-20 NOTE — Addendum Note (Signed)
Addended by: Wandalee Ferdinand on: 11/20/2012 03:01 PM   Modules accepted: Orders

## 2012-11-20 NOTE — Telephone Encounter (Signed)
Refill request for Tasigna forwarded to collaborative nurse. Patient was FTKA for 09/20/12 and has no pending appointments.

## 2013-03-11 ENCOUNTER — Telehealth: Payer: Self-pay | Admitting: Radiology

## 2013-03-11 ENCOUNTER — Other Ambulatory Visit: Payer: Self-pay | Admitting: Oncology

## 2013-03-11 ENCOUNTER — Other Ambulatory Visit: Payer: Self-pay | Admitting: Family Medicine

## 2013-03-11 ENCOUNTER — Encounter: Payer: Self-pay | Admitting: Family Medicine

## 2013-03-11 ENCOUNTER — Ambulatory Visit (INDEPENDENT_AMBULATORY_CARE_PROVIDER_SITE_OTHER): Payer: BC Managed Care – PPO | Admitting: Family Medicine

## 2013-03-11 ENCOUNTER — Telehealth: Payer: Self-pay | Admitting: Oncology

## 2013-03-11 VITALS — BP 114/74 | HR 78 | Temp 98.0°F | Resp 18 | Wt 186.0 lb

## 2013-03-11 DIAGNOSIS — C9210 Chronic myeloid leukemia, BCR/ABL-positive, not having achieved remission: Secondary | ICD-10-CM

## 2013-03-11 DIAGNOSIS — R51 Headache: Secondary | ICD-10-CM

## 2013-03-11 DIAGNOSIS — R161 Splenomegaly, not elsewhere classified: Secondary | ICD-10-CM

## 2013-03-11 DIAGNOSIS — R011 Cardiac murmur, unspecified: Secondary | ICD-10-CM

## 2013-03-11 DIAGNOSIS — R110 Nausea: Secondary | ICD-10-CM

## 2013-03-11 DIAGNOSIS — E876 Hypokalemia: Secondary | ICD-10-CM

## 2013-03-11 LAB — CBC
HCT: 30 % — ABNORMAL LOW (ref 39.0–52.0)
Hemoglobin: 10.5 g/dL — ABNORMAL LOW (ref 13.0–17.0)
MCH: 30.1 pg (ref 26.0–34.0)
MCHC: 35 g/dL (ref 30.0–36.0)
MCV: 86 fL (ref 78.0–100.0)
RDW: 18.3 % — ABNORMAL HIGH (ref 11.5–15.5)

## 2013-03-11 LAB — COMPREHENSIVE METABOLIC PANEL
AST: 23 U/L (ref 0–37)
Albumin: 4.3 g/dL (ref 3.5–5.2)
Alkaline Phosphatase: 66 U/L (ref 39–117)
BUN: 16 mg/dL (ref 6–23)
Creat: 1.02 mg/dL (ref 0.50–1.35)
Glucose, Bld: 87 mg/dL (ref 70–99)

## 2013-03-11 MED ORDER — POTASSIUM CHLORIDE CRYS ER 20 MEQ PO TBCR: 20.0000 meq | EXTENDED_RELEASE_TABLET | Freq: Two times a day (BID) | ORAL | Status: DC

## 2013-03-11 MED ORDER — HYDROCODONE-ACETAMINOPHEN 7.5-325 MG PO TABS: 1.0000 | ORAL_TABLET | Freq: Four times a day (QID) | ORAL | Status: DC | PRN

## 2013-03-11 MED ORDER — ONDANSETRON 4 MG PO TBDP: 4.0000 mg | ORAL_TABLET | Freq: Three times a day (TID) | ORAL | Status: DC | PRN

## 2013-03-11 NOTE — Telephone Encounter (Signed)
lvm for pt on mothers vm due to no vm available on pt phone #...Marland Kitchenwith infor regarding to 11.5.14 appt

## 2013-03-11 NOTE — Patient Instructions (Signed)
Monitor temperature several times daily. If running a fever come in to get checked or go to the emergency room  The oncologists should contact you about the appointment  We will schedule the echocardiogram  Stay well hydrated  Zofran for nausea  Norco for pain

## 2013-03-11 NOTE — Progress Notes (Addendum)
Subjective: 27 year old male with a four-year history of CML, originally diagnosed here by Dr. [name]. He's been under the care of the oncologists hearing [facility], though he has not seen them in over a year. Apparently there has been a difficulty getting appointments at times. He is on Tasigna 2 twice daily, but has not been real faithful taking them lately. Saturday afternoon, about 3 days ago, he developed a headache. The whole head hurts. He has pain in the top part of the neck. He has been nauseated without vomiting. He did work some on Saturday, felt a little better Sunday and Monday, today when he started out to work felt very bad and is feeling offer right now. He has not actually vomited though he is come close. He has not been running any fevers. He has a little rhinorrhea and minimal cough. He sees a Dr. Lorrin Mais.   He has no history of murmurs. When he was originally diagnosed with leukemia he had a very large spleen and very high counts. Medication at one point lower him too well, but he then was stable with good numbers a year ago.   Objective: Ill-appearing man who obviously just doesn't feel well at all today. His TMs normal. Eyes PERRLA. EOMs intact. Fundi benign. Throat clear. Neck supple without lymphadenopathy or thyromegaly. Neck is supple. No meningeal signs. Chest is clear to auscultation. Heart regular, somewhat tachycardiac, with a grade 3-4/6 systolic murmur heard when he is seated but not audible when he is laying down. 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 Skin is unremarkable. No splinter hemorrhages or other rashes were noted.   Assessment: CML Nausea Headache Systolic murmur, new, positional Splenomegaly  Plan: CBC. C. met. Our machine cannot run the CBC so it is being set to [employer].  Zofran and hydrocodone for pain and nausea  Spoke with Dr.Shadad who will try and arrange for him to be seen in the next  couple of days, sometime this week.  We'll schedule a echocardiogram.  . Results for orders placed in visit on 03/11/13  CBC      Result Value Range   WBC 332.5 (*) 4.0 - 10.5 K/uL   RBC 3.49 (*) 4.22 - 5.81 MIL/uL   Hemoglobin 10.5 (*) 13.0 - 17.0 g/dL   HCT 29.5 (*) 62.1 - 30.8 %   MCV 86.0  78.0 - 100.0 fL   MCH 30.1  26.0 - 34.0 pg   MCHC 35.0  30.0 - 36.0 g/dL   RDW 65.7 (*) 84.6 - 96.2 %   Platelets 354  150 - 400 K/uL  COMPREHENSIVE METABOLIC PANEL      Result Value Range   Sodium 142  135 - 145 mEq/L   Potassium 3.2 (*) 3.5 - 5.3 mEq/L   Chloride 104  96 - 112 mEq/L   CO2 28  19 - 32 mEq/L   Glucose, Bld 87  70 - 99 mg/dL   BUN 16  6 - 23 mg/dL   Creat 9.52  8.41 - 3.24 mg/dL   Total Bilirubin 1.4 (*) 0.3 - 1.2 mg/dL   Alkaline Phosphatase 66  39 - 117 U/L   AST 23  0 - 37 U/L   ALT 16  0 - 53 U/L   Total Protein 7.3  6.0 - 8.3 g/dL   Albumin 4.3  3.5 - 5.2 g/dL   Calcium 9.6  8.4 - 40.1 mg/dL

## 2013-03-11 NOTE — Telephone Encounter (Signed)
Call from lab/ CMET and CBC are in / Dr Alwyn Ren advised.

## 2013-03-12 ENCOUNTER — Telehealth: Payer: Self-pay | Admitting: Radiology

## 2013-03-12 ENCOUNTER — Other Ambulatory Visit (HOSPITAL_COMMUNITY): Payer: Managed Care, Other (non HMO)

## 2013-03-12 ENCOUNTER — Encounter (HOSPITAL_COMMUNITY): Payer: Self-pay | Admitting: Emergency Medicine

## 2013-03-12 ENCOUNTER — Other Ambulatory Visit: Payer: BC Managed Care – PPO

## 2013-03-12 ENCOUNTER — Other Ambulatory Visit: Payer: Self-pay | Admitting: Oncology

## 2013-03-12 ENCOUNTER — Emergency Department (HOSPITAL_COMMUNITY)
Admission: EM | Admit: 2013-03-12 | Discharge: 2013-03-12 | Disposition: A | Discharge status: Home / Self Care | Payer: BC Managed Care – PPO | Attending: Emergency Medicine | Admitting: Emergency Medicine

## 2013-03-12 ENCOUNTER — Ambulatory Visit: Payer: Managed Care, Other (non HMO) | Admitting: Oncology

## 2013-03-12 ENCOUNTER — Telehealth: Payer: Self-pay | Admitting: *Deleted

## 2013-03-12 DIAGNOSIS — C9210 Chronic myeloid leukemia, BCR/ABL-positive, not having achieved remission: Secondary | ICD-10-CM | POA: Insufficient documentation

## 2013-03-12 DIAGNOSIS — J45909 Unspecified asthma, uncomplicated: Secondary | ICD-10-CM | POA: Insufficient documentation

## 2013-03-12 DIAGNOSIS — R1110 Vomiting, unspecified: Secondary | ICD-10-CM | POA: Insufficient documentation

## 2013-03-12 DIAGNOSIS — Z8639 Personal history of other endocrine, nutritional and metabolic disease: Secondary | ICD-10-CM | POA: Insufficient documentation

## 2013-03-12 DIAGNOSIS — G43909 Migraine, unspecified, not intractable, without status migrainosus: Secondary | ICD-10-CM | POA: Diagnosis present

## 2013-03-12 DIAGNOSIS — Z862 Personal history of diseases of the blood and blood-forming organs and certain disorders involving the immune mechanism: Secondary | ICD-10-CM | POA: Insufficient documentation

## 2013-03-12 DIAGNOSIS — Z79899 Other long term (current) drug therapy: Secondary | ICD-10-CM | POA: Insufficient documentation

## 2013-03-12 LAB — CBC WITH DIFFERENTIAL/PLATELET
Basophils Absolute: 10.8 10*3/uL — ABNORMAL HIGH (ref 0.0–0.1)
Basophils Relative: 3 % — ABNORMAL HIGH (ref 0–1)
Blasts: 8 %
Hemoglobin: 9.9 g/dL — ABNORMAL LOW (ref 13.0–17.0)
Lymphocytes Relative: 1 % — ABNORMAL LOW (ref 12–46)
Lymphs Abs: 3.6 10*3/uL (ref 0.7–4.0)
MCHC: 33.2 g/dL (ref 30.0–36.0)
MCV: 90.9 fL (ref 78.0–100.0)
Monocytes Absolute: 0 10*3/uL — ABNORMAL LOW (ref 0.1–1.0)
Neutro Abs: 303 10*3/uL — ABNORMAL HIGH (ref 1.7–7.7)
Neutrophils Relative %: 14 % — ABNORMAL LOW (ref 43–77)
Platelets: 371 10*3/uL (ref 150–400)
Promyelocytes Absolute: 0 %
RBC: 3.28 MIL/uL — ABNORMAL LOW (ref 4.22–5.81)
nRBC: 0 /100 WBC

## 2013-03-12 MED ORDER — ONDANSETRON 8 MG PO TBDP
8.0000 mg | ORAL_TABLET | Freq: Once | ORAL | Status: DC
  Filled 2013-03-12: qty 1

## 2013-03-12 MED ORDER — DIPHENHYDRAMINE HCL 50 MG/ML IJ SOLN
25.0000 mg | Freq: Once | INTRAMUSCULAR | Status: AC
  Administered 2013-03-12: 25 mg via INTRAVENOUS
  Filled 2013-03-12: qty 1

## 2013-03-12 MED ORDER — METOCLOPRAMIDE HCL 5 MG/ML IJ SOLN
10.0000 mg | Freq: Once | INTRAMUSCULAR | Status: AC
  Administered 2013-03-12: 10 mg via INTRAVENOUS
  Filled 2013-03-12: qty 2

## 2013-03-12 MED ORDER — SODIUM CHLORIDE 0.9 % IV BOLUS (SEPSIS)
1000.0000 mL | INTRAVENOUS | Status: AC
  Administered 2013-03-12: 1000 mL via INTRAVENOUS

## 2013-03-12 MED ORDER — SODIUM CHLORIDE 0.9 % IV BOLUS (SEPSIS): 1000.0000 mL | INTRAVENOUS | Status: DC

## 2013-03-12 MED ORDER — DEXAMETHASONE SODIUM PHOSPHATE 10 MG/ML IJ SOLN
10.0000 mg | Freq: Once | INTRAMUSCULAR | Status: AC
  Administered 2013-03-12: 10 mg via INTRAVENOUS
  Filled 2013-03-12: qty 1

## 2013-03-12 NOTE — ED Notes (Signed)
Pt escorted to discharge window. Verbalized understanding discharge instructions. In no acute distress. Vitals reviewed and WDL.  

## 2013-03-12 NOTE — Telephone Encounter (Signed)
Received call from fiance Artemio Aly reporting : pt went to Urgent Care yesterday 03/11/13  For dizziness, high white cell counts, weakness, nausea/vomiting.  Pt was evaluated and sent home.  Pt also was prescribed potassium pills due to low potassium level.  Deidra stated this am, pt was still feeling dizzy, still has nausea/vomiting, and not feeling any better.   Deidra wanted to know if pt should wait to see Dr. Clelia Croft this afternoon.   Instructed Deidra to take pt to ER for further evaluation.  Deidra voiced understanding.

## 2013-03-12 NOTE — ED Notes (Signed)
Pt was seen at PCP yesterday for same.  Sts pain and nausea not relived w/ prescriptions.  Hx of leukemia.  Pt's fiance sts his WBCs were high and potassium was low yesterday.

## 2013-03-12 NOTE — ED Notes (Signed)
Pt c/o headaches xseveral days. Reports he was treated at [facility] yesterday and pain unrelieved by percocet.  Pt has hx of CML x4years.

## 2013-03-12 NOTE — ED Notes (Signed)
This RN is going to wait to start a line and draw labs after MD/PA assesses him.

## 2013-03-12 NOTE — ED Provider Notes (Signed)
CSN: [id number]     Arrival date & time 03/12/13  1130 History   First MD Initiated Contact with Patient 03/12/13 1303     Chief Complaint  Patient presents with  . Headache  . Emesis   (Consider location/radiation/quality/duration/timing/severity/associated sxs/prior Treatment) Patient is a 27 y.o. male presenting with headaches and vomiting. The history is provided by the patient.  Headache Pain location:  Generalized Quality:  Dull Severity currently:  6/10 Severity at highest:  9/10 Onset quality:  Gradual Duration:  5 days Timing:  Intermittent Progression:  Waxing and waning Chronicity:  New Similar to prior headaches: yes   Context: bright light   Relieved by:  Nothing Worsened by:  Nothing tried Ineffective treatments: hydrocodone. Associated symptoms: vomiting   Associated symptoms: no abdominal pain, no cough, no diarrhea, no pain, no fever, no focal weakness, no nausea, no neck pain and no numbness   Emesis Associated symptoms: headaches   Associated symptoms: no abdominal pain and no diarrhea     Past Medical History  Diagnosis Date  . Chronic myeloid leukemia in remission   . Unspecified hypothyroidism   . CML (chronic myelocytic leukemia)   . Allergy   . Asthma    Past Surgical History  Procedure Laterality Date  . Appendectomy     History reviewed. No pertinent family history. History  Substance Use Topics  . Smoking status: Never Smoker   . Smokeless tobacco: Not on file  . Alcohol Use: Not on file    Review of Systems  Constitutional: Negative for fever.  HENT: Negative for drooling and rhinorrhea.   Eyes: Negative for pain.  Respiratory: Negative for cough and shortness of breath.   Cardiovascular: Negative for chest pain and leg swelling.  Gastrointestinal: Positive for vomiting. Negative for nausea, abdominal pain and diarrhea.  Genitourinary: Negative for dysuria and hematuria.  Musculoskeletal: Negative for gait problem and neck pain.   Skin: Negative for color change.  Neurological: Positive for headaches. Negative for focal weakness and numbness.  Hematological: Negative for adenopathy.  Psychiatric/Behavioral: Negative for behavioral problems.  All other systems reviewed and are negative.    Allergies  Review of patient's allergies indicates no known allergies.  Home Medications   Current Outpatient Rx  Name  Route  Sig  Dispense  Refill  . aspirin 325 MG tablet   Oral   Take 650 mg by mouth every 4 (four) hours as needed for headache.         [name] HYDROcodone-acetaminophen (NORCO) 7.5-325 MG per tablet   Oral   Take 1 tablet by mouth every 6 (six) hours as needed.   30 tablet   0   . loratadine (CLARITIN) 10 MG tablet   Oral   Take 10 mg by mouth daily as needed for allergies (headache).         . nilotinib (TASIGNA) 150 MG capsule   Oral   Take 2 capsules (300 mg total) by mouth every 12 (twelve) hours.   120 capsule   0     Faxed to [facility] [phone]   . ondansetron (ZOFRAN ODT) 4 MG disintegrating tablet   Oral   Take 1 tablet (4 mg total) by mouth every 8 (eight) hours as needed for nausea or vomiting.   20 tablet   0   . potassium chloride SA (K-DUR,KLOR-CON) 20 MEQ tablet   Oral   Take 1 tablet (20 mEq total) by mouth 2 (two) times daily.   20 tablet  0    BP 129/76  Pulse 85  Temp(Src) 98.2 F (36.8 C) (Oral)  Resp 16  SpO2 95% Physical Exam  Nursing note and vitals reviewed. Constitutional: He is oriented to person, place, and time. He appears well-developed and well-nourished.  HENT:  Head: Normocephalic and atraumatic.  Right Ear: External ear normal.  Left Ear: External ear normal.  Nose: Nose normal.  Mouth/Throat: Oropharynx is clear and moist. No oropharyngeal exudate.  Eyes: Conjunctivae and EOM are normal. Pupils are equal, round, and reactive to light.  Neck: Normal range of motion. Neck supple.  Cardiovascular: Normal rate, regular  rhythm, normal heart sounds and intact distal pulses.  Exam reveals no gallop and no friction rub.   No murmur heard. Pulmonary/Chest: Effort normal and breath sounds normal. No respiratory distress. He has no wheezes.  Abdominal: Soft. Bowel sounds are normal. He exhibits no distension. There is no tenderness. There is no rebound and no guarding.  Musculoskeletal: Normal range of motion. He exhibits no edema and no tenderness.  Neurological: He is alert and oriented to person, place, and time. He has normal strength. No cranial nerve deficit or sensory deficit. Coordination normal.  Skin: Skin is warm and dry.  Psychiatric: He has a normal mood and affect. His behavior is normal.    ED Course  Procedures (including critical care time) Labs Review Labs Reviewed - No data to display Imaging Review No results found.  EKG Interpretation   None       MDM   1. Migraine    1:39 PM 27 y.o. male currently being treated for CML presents with gradual onset headache which began 5 days ago while the patient was at work. He denies any fevers, he has had some mild nausea and vomiting. He is afebrile and vital signs are unremarkable here. He has a history of migraines as a child. He notes his headache feels similar to when he had migraines as a child. Screening labwork yesterday showed greatly elevated white blood cell count which is consistent with his CML. He also had mild hypokalemia and is currently taking by mouth supplementation for that. He is neurologically intact on my exam. Will treat with IV fluid bolus, Reglan, Benadryl.  Will get cbc at request of Oncologist who will see pt in the ED as he missed his appt to be seen here for his HA.   HA now gone, pt feeling much better. He has been seen by his oncologist. Dr. Juliette Alcide ok w/ pt getting decadron as a part of the migraine cocktail. Will add.  I have discussed the diagnosis/risks/treatment options with the patient and believe the pt to be  eligible for discharge home to follow-up with pcp as needed. We also discussed returning to the ED immediately if new or worsening sx occur. We discussed the sx which are most concerning (e.g., worsening HA, fever) that necessitate immediate return. Any new prescriptions provided to the patient are listed below.  Discharge Medication List as of 03/12/2013  4:00 PM         Junius Argyle, MD 03/13/13 [phone]

## 2013-03-12 NOTE — Telephone Encounter (Signed)
Spoke to patients fiancee, he is vomiting with severe headache today. He improved slightly last pm, but now he is getting worse. I have advised her to take him to [facility] ER for evaluation. The oncologist can work him in this afternoon, but with his symptoms, he does not feel he can wait until this afternoon. I will ask Dr Alwyn Ren to call [facility] ER to let them know his situation. Amy

## 2013-03-12 NOTE — ED Notes (Signed)
MD at bedside. 

## 2013-03-12 NOTE — Progress Notes (Signed)
Patient seen and examined in the emergency department today. 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 He has been on Tasigna but have been rather noncompliant in him taking the medication as well as follow up.  He was last seen in clinic over a year ago and have rescheduled on multiple occasions. He presented to his prior care physician with complaints of headaches and found to have an elevated white cell count of over 300,000. He was evaluated today the emergency department and a repeat CBC confirmed the presence of white cell count of 360,000. His hemoglobin was 9.9 and platelet count of 371. The peripheral smear was reviewed personally with Dr. Laureen Ochs from pathology and felt that this is consistent with CML with slight increased in the blast percentage. We felt that the manual differential indicate the blast percentage of less than 10%. The peripheral blood flow cytometry is currently pending will be completed in the next 24 hours.   Clinically, the patient was treated for migraine headaches with anti-emetics and dexamethasone and his symptoms have nearly resolved. He is not reporting any other neurological symptoms. Does not report any blurry vision or double vision. Does not report any slurred speech or difficulty ambulating. Did not report any constitutional symptoms of fevers or chills or sweats. He does not report any weakness fatigue or tiredness. He reports that he has been completely asymptomatic total his headache started around Saturday and Sunday.  On physical examination he was awake alert did not appear any distress. His head was normocephalic atraumatic. Pupils are equal round reactive to light his funduscopic examination did not reveal any abnormalities. His heart was regular rate and rhythm. Lungs are clear auscultation abdomen is soft with palpable splenomegaly. Extremities no edema.  Impression:  27 year old gentleman with CML now  presents with rapid rise in his white cell count up to 360 and splenomegaly. It is unclear at this point is whether this is transforming into accelerated phase versus his disease going out of control due to not taking his medication in the last month. He reports he probably has taken his medication less than 7 days in the last 28 days. It is also possible as mentioned that he could be developing a more accelerated phase.  I doubt that he is developing a blast phase and I doubt that his headache is related to hyperleukocytosis.  Recommendations: I have emphasized the importance resuming his medication as soon as possible which he will do today and I will repeat his CBC next week to see if there is any improvement in his counts. We will also obtain peripheral blood flow cytometry for better quantification of his blast count.  I see no urgent need for leukapheresis at this time given the fact that I do not feel that he has an increased blast count.  I feel that he could likely go home after his evaluation in the emergency department is complete.

## 2013-03-13 ENCOUNTER — Emergency Department (HOSPITAL_COMMUNITY)
Admission: EM | Admit: 2013-03-13 | Discharge: 2013-03-13 | Disposition: A | Discharge status: Home / Self Care | Payer: BC Managed Care – PPO | Attending: Emergency Medicine | Admitting: Emergency Medicine

## 2013-03-13 ENCOUNTER — Encounter (HOSPITAL_COMMUNITY): Payer: Self-pay | Admitting: Emergency Medicine

## 2013-03-13 ENCOUNTER — Other Ambulatory Visit: Payer: Self-pay | Admitting: Oncology

## 2013-03-13 DIAGNOSIS — C9210 Chronic myeloid leukemia, BCR/ABL-positive, not having achieved remission: Secondary | ICD-10-CM

## 2013-03-13 DIAGNOSIS — Z79899 Other long term (current) drug therapy: Secondary | ICD-10-CM | POA: Insufficient documentation

## 2013-03-13 DIAGNOSIS — Z862 Personal history of diseases of the blood and blood-forming organs and certain disorders involving the immune mechanism: Secondary | ICD-10-CM | POA: Insufficient documentation

## 2013-03-13 DIAGNOSIS — G43909 Migraine, unspecified, not intractable, without status migrainosus: Secondary | ICD-10-CM

## 2013-03-13 DIAGNOSIS — J45909 Unspecified asthma, uncomplicated: Secondary | ICD-10-CM | POA: Insufficient documentation

## 2013-03-13 DIAGNOSIS — Z8639 Personal history of other endocrine, nutritional and metabolic disease: Secondary | ICD-10-CM | POA: Insufficient documentation

## 2013-03-13 MED ORDER — DIPHENHYDRAMINE HCL 50 MG/ML IJ SOLN
25.0000 mg | Freq: Once | INTRAMUSCULAR | Status: AC
  Administered 2013-03-13: 25 mg via INTRAVENOUS
  Filled 2013-03-13: qty 1

## 2013-03-13 MED ORDER — METOCLOPRAMIDE HCL 5 MG/ML IJ SOLN
10.0000 mg | Freq: Once | INTRAMUSCULAR | Status: AC
  Administered 2013-03-13: 10 mg via INTRAVENOUS
  Filled 2013-03-13: qty 2

## 2013-03-13 MED ORDER — PROMETHAZINE HCL 25 MG PO TABS: 25.0000 mg | ORAL_TABLET | Freq: Four times a day (QID) | ORAL | Status: DC | PRN

## 2013-03-13 MED ORDER — DEXAMETHASONE SODIUM PHOSPHATE 10 MG/ML IJ SOLN
10.0000 mg | Freq: Once | INTRAMUSCULAR | Status: AC
  Administered 2013-03-13: 10 mg via INTRAVENOUS
  Filled 2013-03-13: qty 1

## 2013-03-13 MED ORDER — SODIUM CHLORIDE 0.9 % IV BOLUS (SEPSIS)
1000.0000 mL | Freq: Once | INTRAVENOUS | Status: AC
  Administered 2013-03-13: 1000 mL via INTRAVENOUS

## 2013-03-13 NOTE — ED Provider Notes (Signed)
Medical screening examination/treatment/procedure(s) were conducted as a shared visit with non-physician practitioner(s) and myself.  I personally evaluated the patient during the encounter.  EKG Interpretation   None       Pt c/o dull headache, at times throbbing, c/w prior 'migraines'. Notes hx migraines starting in teens, more recently headache approximately q 1 month, w severe headache q 4-6 months. No neck stiffness. No change in vision or speech. No numbness/weakness. No fever. Hx cml, recent non compliance w meds, and markedly elevated wbc - given above heme/onc consult.    Suzi Roots, MD 03/13/13 [phone]

## 2013-03-13 NOTE — Telephone Encounter (Signed)
Tried 3 times to get through to ER, unsuccessfully.  I see he was seen.

## 2013-03-13 NOTE — Progress Notes (Signed)
Orders only

## 2013-03-13 NOTE — ED Notes (Signed)
Pt has had headache for several days. Pt is also a leukemia pt. Pt came earlier today for headache, WBC elevated. Pt given migraine cocktail, "felt great." Around 0030 today, pt woke with severe generalized head pain. Pt states pain so bad it's making him nauseous. Denies any other sx.

## 2013-03-13 NOTE — ED Provider Notes (Signed)
CSN: [id number]     Arrival date & time 03/13/13  0207 History   First MD Initiated Contact with Patient 03/13/13 0211     Chief Complaint  Patient presents with  . Headache   (Consider location/radiation/quality/duration/timing/severity/associated sxs/prior Treatment) HPI History provided by pt and prior chart.  Pt is currently being treated for CML.  Was evaluate in ED yesterday for migraine headache x 5 days, treated w/ IV reglan, benadryl and decadron, pain improved and he was discharged home.  Returns this morning because his pain returned ~2 hours ago and is severe.  Diffuse pressure and throbbing w/ associated photo/phonophobia and nausea.  Denies vision changes and dizziness.  H/o migraines as a child, and current headache feels similar.  Was seen by his oncologist yesterday d/t acutely elevated WBC count, and thought to be d/t either accelerated phase of illness or non-compliance with medications.  Past Medical History  Diagnosis Date  . Chronic myeloid leukemia in remission   . Unspecified hypothyroidism   . CML (chronic myelocytic leukemia)   . Allergy   . Asthma    Past Surgical History  Procedure Laterality Date  . Appendectomy     History reviewed. No pertinent family history. History  Substance Use Topics  . Smoking status: Never Smoker   . Smokeless tobacco: Not on file  . Alcohol Use: Yes     Comment: occasional    Review of Systems  All other systems reviewed and are negative.    Allergies  Review of patient's allergies indicates no known allergies.  Home Medications   Current Outpatient Rx  Name  Route  Sig  Dispense  Refill  . aspirin 325 MG tablet   Oral   Take 650 mg by mouth every 4 (four) hours as needed for headache.         [name] HYDROcodone-acetaminophen (NORCO) 7.5-325 MG per tablet   Oral   Take 1 tablet by mouth every 6 (six) hours as needed.   30 tablet   0   . loratadine (CLARITIN) 10 MG tablet   Oral   Take 10 mg by mouth daily as  needed for allergies (headache).         . nilotinib (TASIGNA) 150 MG capsule   Oral   Take 2 capsules (300 mg total) by mouth every 12 (twelve) hours.   120 capsule   0     Faxed to [facility] [phone]   . ondansetron (ZOFRAN ODT) 4 MG disintegrating tablet   Oral   Take 1 tablet (4 mg total) by mouth every 8 (eight) hours as needed for nausea or vomiting.   20 tablet   0   . potassium chloride SA (K-DUR,KLOR-CON) 20 MEQ tablet   Oral   Take 1 tablet (20 mEq total) by mouth 2 (two) times daily.   20 tablet   0    There were no vitals taken for this visit. Physical Exam  Nursing note and vitals reviewed. Constitutional: He is oriented to person, place, and time. He appears well-developed and well-nourished.  Uncomfortable appearing  HENT:  Head: Normocephalic and atraumatic.  No tenderness of sinuses or temples.   Eyes:  Normal appearance.  Photophobia  Neck: Normal range of motion. Neck supple. No rigidity. No Brudzinski's sign and no Kernig's sign noted.  Cardiovascular: Normal rate, regular rhythm and intact distal pulses.   Pulmonary/Chest: Effort normal and breath sounds normal.  Musculoskeletal: Normal range of motion.  Neurological: He is alert and  oriented to person, place, and time. No sensory deficit. Coordination normal.  CN 3-12 intact.  No nystagmus.  5/5 and equal upper and lower extremity strength.  No past pointing.    Skin: Skin is warm and dry. No rash noted.  Psychiatric: He has a normal mood and affect. His behavior is normal.    ED Course  Procedures (including critical care time) Labs Review Labs Reviewed - No data to display Imaging Review No results found.  EKG Interpretation   None       MDM   1. Migraine    27yo M w/ CML, presents for the second day in a row w/ headache.  Remote h/o migraines and current pain feel similar.  Received an IV migraine cocktail yesterday w/ relief, but pain returned 2 hours ago.   Was evaluated by oncology yesterday afternoon d/t acutely elevated WBC count of 360,000.  It was thought to be d/t medical non-compliance vs. Accelerated phase of CML.  Peripheral smear ordered to determine if he had a predominance of blasts, requiring leukophoresis, and it showed "presence of blasts".  On exam today, afebrile, non-toxic but uncomfortable appearing, no focal neuro deficits or meningeal signs.  IVF, reglan, benadryl and decadron ordered.  Will reassess shortly.  3:22 AM   Pain decreased from 10 to 1-2/10.  Pt appears much more alert and comfortable.  Discussed case w/ Dr. Cyndie Chime who is not alarmed by presence of blasts on peripheral smear and does not believe this is in any way related to his headache.  He recommends fundoscopic exam to evaluate for retinal hemorrhage and if nml, d/c home to f/u on outpatient basis.  No obvious abnormalities on my exam, and per prior chart, no retinal hemorrhages on Dr. Blima Ledger exam yesterday afternoon.  Pt d/c'd home w/ referral to neuro and phenergan to be taken with benadryl to trial for pain.  Advised f/u with Oncology as well.  Return precautions discussed.    Otilio Miu, PA-C 03/13/13 [phone]

## 2013-03-14 ENCOUNTER — Encounter: Payer: Self-pay | Admitting: Cardiology

## 2013-03-14 ENCOUNTER — Telehealth: Payer: Self-pay | Admitting: Oncology

## 2013-03-14 NOTE — Telephone Encounter (Signed)
s.w. pt and advised on 11.13.14 appt...pt ok and aware

## 2013-03-16 ENCOUNTER — Ambulatory Visit (HOSPITAL_COMMUNITY)
Admission: RE | Admit: 2013-03-16 | Discharge: 2013-03-16 | Disposition: A | Discharge status: Home / Self Care | Payer: BC Managed Care – PPO | Source: Ambulatory Visit | Attending: Internal Medicine | Admitting: Internal Medicine

## 2013-03-16 ENCOUNTER — Ambulatory Visit (INDEPENDENT_AMBULATORY_CARE_PROVIDER_SITE_OTHER): Payer: BC Managed Care – PPO | Admitting: Internal Medicine

## 2013-03-16 VITALS — BP 110/64 | HR 61 | Temp 97.9°F | Resp 20

## 2013-03-16 DIAGNOSIS — G43909 Migraine, unspecified, not intractable, without status migrainosus: Secondary | ICD-10-CM

## 2013-03-16 DIAGNOSIS — R112 Nausea with vomiting, unspecified: Secondary | ICD-10-CM

## 2013-03-16 DIAGNOSIS — R51 Headache: Secondary | ICD-10-CM | POA: Insufficient documentation

## 2013-03-16 DIAGNOSIS — R110 Nausea: Secondary | ICD-10-CM

## 2013-03-16 MED ORDER — ONDANSETRON 4 MG PO TBDP: 8.0000 mg | ORAL_TABLET | Freq: Once | ORAL | Status: DC

## 2013-03-16 MED ORDER — KETOROLAC TROMETHAMINE 60 MG/2ML IM SOLN: 60.0000 mg | Freq: Once | INTRAMUSCULAR | Status: AC

## 2013-03-16 MED ORDER — OXYCODONE-ACETAMINOPHEN 10-325 MG PO TABS: 1.0000 | ORAL_TABLET | Freq: Four times a day (QID) | ORAL | Status: DC | PRN

## 2013-03-16 MED ORDER — MELOXICAM 15 MG PO TABS: 15.0000 mg | ORAL_TABLET | Freq: Every day | ORAL | Status: DC

## 2013-03-16 MED ORDER — ONDANSETRON 8 MG PO TBDP: 8.0000 mg | ORAL_TABLET | Freq: Three times a day (TID) | ORAL | Status: DC | PRN

## 2013-03-16 MED ORDER — SUMATRIPTAN SUCCINATE 100 MG PO TABS: ORAL_TABLET | ORAL | Status: DC

## 2013-03-16 NOTE — Progress Notes (Addendum)
Subjective:    Patient ID: Nathan Kerr, male    DOB: 1985-06-08, 27 y.o.   MRN: [id number]  HPI HPI Comments: Lydon Vansickle is a 27 y.o. male who presents to the [facility], for 1st ov w/ me, complaining of nausea with associated h/a and emesis. Pt was able to work yesterday and the day before. Symptoms began this morning after pt woke up and showered. Pt has been vomiting off and on since arrival to the urgent care. Yesterday he could feel the "shawdow" of a headache coming on but symptoms were not severe. H/a is sensitive to light and noise. Pt has a hx of migraines and leukemia. Pt denies allergies or trouble with vision. Pt is not having loss of function of the extremities. Pt is not running a fever. He is getting married in one week. See recent ER and oncology evaluations since last ov here.   Hx migr since childhood Denies other stressors  Past Medical History  Diagnosis Date  . Chronic myeloid leukemia in remission   . Unspecified hypothyroidism   . CML (chronic myelocytic leukemia)   . Allergy   . Asthma    Past Surgical History  Procedure Laterality Date  . Appendectomy    No Known Allergies History   Social History  . Marital Status: Single    Spouse Name: N/A    Number of Children: N/A  . Years of Education: N/A   Occupational History  . Not on file.   Social History Main Topics  . Smoking status: Never Smoker   . Smokeless tobacco: Not on file  . Alcohol Use: Yes     Comment: occasional  . Drug Use: No  . Sexual Activity: Not on file   Other Topics Concern  . Not on file   Social History Narrative  . No narrative on file     Review of Systems  Constitutional: Negative for fever, chills, diaphoresis, fatigue and unexpected weight change.  Eyes: Negative for visual disturbance.  Respiratory: Negative for cough, chest tightness and shortness of breath.   Cardiovascular: Negative for chest pain, palpitations and leg swelling.   Gastrointestinal: Positive for nausea and vomiting. Negative for abdominal pain and diarrhea.  Genitourinary: Negative for dysuria and frequency.  Skin: Negative for rash.  Neurological: Positive for weakness and headaches. Negative for dizziness, tremors, speech difficulty and light-headedness.       Objective:   Physical Exam  Nursing note and vitals reviewed. Constitutional: He is oriented to person, place, and time. He appears well-developed and well-nourished. He appears distressed.  Photophobic/in pain 8/10 HA Vomited x2 during exam  HENT:  Head: Normocephalic and atraumatic.  Eyes: Conjunctivae and EOM are normal. Pupils are equal, round, and reactive to light.  Neck: Neck supple.  Cardiovascular: Normal rate, regular rhythm and normal heart sounds.   No murmur heard. Pulmonary/Chest: Effort normal and breath sounds normal.  Abdominal: Soft. Bowel sounds are normal. There is no tenderness.  Musculoskeletal: Normal range of motion.  Lymphadenopathy:    He has no cervical adenopathy.  Neurological: He is alert and oriented to person, place, and time. He has normal reflexes. No cranial nerve deficit. He exhibits normal muscle tone.  5/5 strength in major muscle groups of  bilateral upper and lower extremities. Speech normal. No facial asymetry.   Skin: No rash noted.   Given 60 toradol and 8 zofran Rested 30 min and improved-HA 2/10 No further vomiting/nausea.      Assessment &  Plan:  HA syndrome--unresolved migraine  Continue zofran q 6-8h  Fluids  meloxicam 15 qd  If HA resolves, may use imitrex 100 at onset next HA  Oxy 10/325 plus 325 tyl q6h til resolved  F/u neuro as planned  CT to r/o mass lesion, bleed  CML-uncontrolled  Reck cbc 11/13 oncology  I have completed the patient encounter in its entirety as documented by the scribe, with editing by me where necessary. Saryna Kneeland P. Merla Riches, M.D.    [place]=CT wnl

## 2013-03-17 ENCOUNTER — Telehealth: Payer: Self-pay

## 2013-03-17 MED ORDER — PROMETHAZINE HCL 25 MG PO TABS: 25.0000 mg | ORAL_TABLET | Freq: Four times a day (QID) | ORAL | Status: DC | PRN

## 2013-03-17 NOTE — Telephone Encounter (Signed)
He does have follow up on Thursday with Dr Clelia Croft, he also has the phergan, at home already, she will have him try this.

## 2013-03-17 NOTE — Telephone Encounter (Signed)
Called to advise.  

## 2013-03-17 NOTE — Telephone Encounter (Signed)
Will try phenergan.  Sent to pharmacy.  Would also encourage pt to f/u with Dr. Clelia Croft as directed by Dr. Alwyn Ren at 11/4 OV

## 2013-03-17 NOTE — Telephone Encounter (Signed)
Patient's fiance states that the Zofron is not working for him. He does not have a migraine but is still experiencing other issues. Fiance states that patient sees Dr. Merla Riches but he has been communicating with her. Fiance also states that they are getting married this weekend and wants to make sure he is in good shape :-)  Please call Diamond Nickel at [phone].

## 2013-03-17 NOTE — Telephone Encounter (Signed)
What are the other issues? She is on his HIPAA, he is better headache gone, but he is still nauseated, states the Zofran not helping. Please advise.

## 2013-03-20 ENCOUNTER — Telehealth: Payer: Self-pay | Admitting: Oncology

## 2013-03-20 ENCOUNTER — Other Ambulatory Visit (HOSPITAL_BASED_OUTPATIENT_CLINIC_OR_DEPARTMENT_OTHER): Payer: BC Managed Care – PPO

## 2013-03-20 ENCOUNTER — Ambulatory Visit (HOSPITAL_BASED_OUTPATIENT_CLINIC_OR_DEPARTMENT_OTHER): Payer: BC Managed Care – PPO | Admitting: Oncology

## 2013-03-20 VITALS — BP 127/82 | HR 71 | Temp 98.2°F | Resp 18 | Ht 70.5 in | Wt 178.5 lb

## 2013-03-20 DIAGNOSIS — R161 Splenomegaly, not elsewhere classified: Secondary | ICD-10-CM

## 2013-03-20 DIAGNOSIS — C9211 Chronic myeloid leukemia, BCR/ABL-positive, in remission: Secondary | ICD-10-CM

## 2013-03-20 DIAGNOSIS — C9210 Chronic myeloid leukemia, BCR/ABL-positive, not having achieved remission: Secondary | ICD-10-CM

## 2013-03-20 DIAGNOSIS — D759 Disease of blood and blood-forming organs, unspecified: Secondary | ICD-10-CM

## 2013-03-20 LAB — MANUAL DIFFERENTIAL
ALC: 3.7 10*3/uL — ABNORMAL HIGH (ref 0.9–3.3)
Band Neutrophils: 36 % — ABNORMAL HIGH (ref 0–10)
Metamyelocytes: 13 % — ABNORMAL HIGH (ref 0–0)
Myelocytes: 25 % — ABNORMAL HIGH (ref 0–0)
Other Cell: 0 % (ref 0–0)
PLT EST: ADEQUATE
PROMYELO: 2 % — ABNORMAL HIGH (ref 0–0)
SEG: 16 % — ABNORMAL LOW (ref 38–77)
Variant Lymph: 0 % (ref 0–0)

## 2013-03-20 LAB — CBC WITH DIFFERENTIAL/PLATELET
HCT: 29.5 % — ABNORMAL LOW (ref 38.4–49.9)
HGB: 8.3 g/dL — ABNORMAL LOW (ref 13.0–17.1)
MCV: 88.9 fL (ref 79.3–98.0)
Platelets: 293 10*3/uL (ref 140–400)
RBC: 3.31 10*6/uL — ABNORMAL LOW (ref 4.20–5.82)
WBC: 370 10*3/uL (ref 4.0–10.3)

## 2013-03-20 LAB — COMPREHENSIVE METABOLIC PANEL (CC13)
AST: 17 U/L (ref 5–34)
Albumin: 4.2 g/dL (ref 3.5–5.0)
Anion Gap: 11 mEq/L (ref 3–11)
BUN: 17.7 mg/dL (ref 7.0–26.0)
CO2: 22 mEq/L (ref 22–29)
Calcium: 9.9 mg/dL (ref 8.4–10.4)
Chloride: 108 mEq/L (ref 98–109)
Creatinine: 1.1 mg/dL (ref 0.7–1.3)
Glucose: 91 mg/dl (ref 70–140)
Total Bilirubin: 1.62 mg/dL — ABNORMAL HIGH (ref 0.20–1.20)

## 2013-03-20 NOTE — Progress Notes (Signed)
Hematology and Oncology Follow Up Visit  Nathan Kerr [id number] 22-Apr-1986 27 y.o. 03/20/2013 10:22 AM   CC: Nathan Kerr, M.D.    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   Prior Therapy:  1. Patient treated with Gleevec 400 mg daily between April of 2010 until September of 2010. 2.     Patient treated with Gleevec 200 mg daily due to severe thrombocytopenia and subsequently switched to Tasigna for lack of efficacy and poor tolerance.   Current therapy: He is on Tasigna 300 mg tablet b.i.d., total 600 mg daily, started in August of 2011.  He had a complete hematological remission and near molecular remission at this time.   Interim History: [name] presents today for a followup visit.  He is a very pleasant gentleman with chronic phase CML.  He failed to show up on multiple occasions since August of 2013 and have been very erratic and noncompliant with his medication. He presented to the emergency department last week with complaints of migraine headaches and found to have elevated white cell count of over 300,000. His peripheral smear was evaluated and showed that he has 4% blasts and not likely represents transformation. At that time, he was restarted on the medication and presents today for a followup in the outpatient setting. Clinically, he reports is feeling a lot better and his migraine headaches are much improved. Is not reporting any neurological symptoms. Is not reporting any fevers or chills or sweats. That report really any constitutional symptoms. He reports his abdominal pain and satiety have improved in the last week.  Medications: I have reviewed the patient's current medications. Current Outpatient Prescriptions  Medication Sig Dispense Refill  . aspirin 325 MG tablet Take 650 mg by mouth every 4 (four) hours as needed for headache.      . loratadine (CLARITIN) 10 MG tablet Take 10  mg by mouth daily as needed for allergies (headache).      . meloxicam (MOBIC) 15 MG tablet Take 1 tablet (15 mg total) by mouth daily.  30 tablet  0  . nilotinib (TASIGNA) 150 MG capsule Take 2 capsules (300 mg total) by mouth every 12 (twelve) hours.  120 capsule  0  . ondansetron (ZOFRAN ODT) 8 MG disintegrating tablet Take 1 tablet (8 mg total) by mouth every 8 (eight) hours as needed for nausea or vomiting.  20 tablet  2  . oxyCODONE-acetaminophen (PERCOCET) 10-325 MG per tablet Take 1 tablet by mouth every 6 (six) hours as needed for pain.  30 tablet  0  . promethazine (PHENERGAN) 25 MG tablet Take 1 tablet (25 mg total) by mouth every 6 (six) hours as needed for nausea or vomiting.  20 tablet  0  . SUMAtriptan (IMITREX) 100 MG tablet Take 1 tab at onset of HA. May repeat in 2 hours if headache persists or recurs. No more than 2 tabs in 24 hrs or 4 tabs in a week.  10 tablet  1   Current Facility-Administered Medications  Medication Dose Route Frequency Provider Last Rate Last Dose  . ketorolac (TORADOL) injection 60 mg  60 mg Intramuscular Once Nathan Pearson, MD      . ondansetron (ZOFRAN-ODT) disintegrating tablet 8 mg  8 mg Oral Once Nathan Pearson, MD        Allergies: No Known Allergies  Past Medical History, Surgical history, Social history, and Family History were reviewed and updated.  Review of Systems:  Remaining ROS negative.  Physical Exam: Blood pressure 127/82, pulse 71, temperature 98.2 F (36.8 C), temperature source Oral, resp. rate 18, height 5' 10.5" (1.791 m), weight 178 lb 8 oz (80.967 kg). ECOG: 0 General appearance: alert Head: Normocephalic, without obvious abnormality, atraumatic Neck: no adenopathy, no carotid bruit, no JVD, supple, symmetrical, trachea midline and thyroid not enlarged, symmetric, no tenderness/mass/nodules Lymph nodes: Cervical, supraclavicular, and axillary nodes normal. Heart:regular rate and rhythm, S1, S2 normal, no murmur,  click, rub or gallop Lung:chest clear, no wheezing, rales, normal symmetric air entry Abdomen: Soft and nontender. He does have an enlarged spleen but haven't regressed compared to last week's examination. EXT:no erythema, induration, or nodules Neurological exam: No deficits retinal hemorrhages noted on examination  Lab Results: Lab Results  Component Value Date   WBC 370.0* 03/20/2013   HGB 8.3* 03/20/2013   HCT 29.5* 03/20/2013   MCV 88.9 03/20/2013   PLT 293 03/20/2013     Chemistry      Component Value Date/Time   NA 142 03/11/2013 1321   K 3.2* 03/11/2013 1321   CL 104 03/11/2013 1321   CO2 28 03/11/2013 1321   BUN 16 03/11/2013 1321   CREATININE 1.02 03/11/2013 1321   CREATININE 0.96 12/27/2011 1019      Component Value Date/Time   CALCIUM 9.6 03/11/2013 1321   ALKPHOS 66 03/11/2013 1321   AST 23 03/11/2013 1321   ALT 16 03/11/2013 1321   BILITOT 1.4* 03/11/2013 1321      Impression and Plan:  27 year old gentleman with the following issues: 1. Chronic phase chronic myelogenous leukemia.  His white cell count is very high today but with very low blast count which goes against a transformation to an accelerated or blast phase and indicative more of a chronic phase CML the reason why his white cell count is elevated is either noncompliance versus relapse on his current medication. I feel that his white cell count have stabilized from last week and his splenomegaly is slightly improved to it is possible that with adherence to his medication I anticipate resumption of his remission status. Should this not happen in the next 2 weeks, then I will switch him to a different agent at that time.  2. Cytopenia.  This is likely related to the medication as well as possible relapse of his CML. 3. Follow-up. I will check his CBC in 2 weeks and if his counts continue to be high I will switch him to a different agent and then a haven't M.D. followup in one month from  now.  Nathan Kerr 11/13/201410:22 AM

## 2013-03-20 NOTE — Telephone Encounter (Signed)
gv and printed appt sched and avs for pt for NOV and DEc... °

## 2013-03-31 ENCOUNTER — Ambulatory Visit (HOSPITAL_COMMUNITY): Payer: BC Managed Care – PPO | Attending: Family Medicine | Admitting: Radiology

## 2013-03-31 DIAGNOSIS — G43909 Migraine, unspecified, not intractable, without status migrainosus: Secondary | ICD-10-CM | POA: Insufficient documentation

## 2013-03-31 DIAGNOSIS — R51 Headache: Secondary | ICD-10-CM

## 2013-03-31 DIAGNOSIS — R110 Nausea: Secondary | ICD-10-CM

## 2013-03-31 DIAGNOSIS — R011 Cardiac murmur, unspecified: Secondary | ICD-10-CM

## 2013-03-31 DIAGNOSIS — R161 Splenomegaly, not elsewhere classified: Secondary | ICD-10-CM

## 2013-03-31 DIAGNOSIS — C9210 Chronic myeloid leukemia, BCR/ABL-positive, not having achieved remission: Secondary | ICD-10-CM | POA: Insufficient documentation

## 2013-03-31 NOTE — Progress Notes (Signed)
Echocardiogram performed.  

## 2013-04-01 ENCOUNTER — Other Ambulatory Visit: Payer: BC Managed Care – PPO

## 2013-04-08 ENCOUNTER — Telehealth: Payer: Self-pay

## 2013-04-08 NOTE — Telephone Encounter (Addendum)
Patient is faxing over a form to be completed by the last doctor he saw. Patient states this is for insurance and job purposes. Patient has leukemia   [phone]  Meant to send to medical records- Luis M. Cintron

## 2013-04-15 ENCOUNTER — Other Ambulatory Visit (HOSPITAL_BASED_OUTPATIENT_CLINIC_OR_DEPARTMENT_OTHER): Payer: BC Managed Care – PPO

## 2013-04-15 DIAGNOSIS — C9210 Chronic myeloid leukemia, BCR/ABL-positive, not having achieved remission: Secondary | ICD-10-CM

## 2013-04-15 LAB — CBC WITH DIFFERENTIAL/PLATELET
BASO%: 2.5 % — ABNORMAL HIGH (ref 0.0–2.0)
Basophils Absolute: 0.1 10*3/uL (ref 0.0–0.1)
EOS%: 0.8 % (ref 0.0–7.0)
Eosinophils Absolute: 0 10*3/uL (ref 0.0–0.5)
HCT: 36 % — ABNORMAL LOW (ref 38.4–49.9)
HGB: 12 g/dL — ABNORMAL LOW (ref 13.0–17.1)
MCH: 29.7 pg (ref 27.2–33.4)
MCV: 88.9 fL (ref 79.3–98.0)
MONO#: 0.1 10*3/uL (ref 0.1–0.9)
NEUT#: 3.3 10*3/uL (ref 1.5–6.5)
NEUT%: 70.6 % (ref 39.0–75.0)
RDW: 19.9 % — ABNORMAL HIGH (ref 11.0–14.6)
lymph#: 1.2 10*3/uL (ref 0.9–3.3)

## 2013-04-15 LAB — COMPREHENSIVE METABOLIC PANEL (CC13)
ALT: 18 U/L (ref 0–55)
AST: 12 U/L (ref 5–34)
Albumin: 4 g/dL (ref 3.5–5.0)
Anion Gap: 10 mEq/L (ref 3–11)
BUN: 13.9 mg/dL (ref 7.0–26.0)
Calcium: 9.4 mg/dL (ref 8.4–10.4)
Chloride: 108 mEq/L (ref 98–109)
Creatinine: 0.9 mg/dL (ref 0.7–1.3)
Glucose: 103 mg/dl (ref 70–140)
Potassium: 3.9 mEq/L (ref 3.5–5.1)

## 2013-04-15 LAB — TECHNOLOGIST REVIEW

## 2013-04-15 NOTE — Telephone Encounter (Signed)
Unable to reach patient, to let him know that his labs are all normal. VM box has not been set up.

## 2013-04-17 ENCOUNTER — Telehealth: Payer: Self-pay | Admitting: *Deleted

## 2013-04-17 NOTE — Telephone Encounter (Signed)
Spoke with patient, labs are all good and he is to continue with the [place].

## 2013-04-22 ENCOUNTER — Other Ambulatory Visit: Payer: Self-pay | Admitting: Oncology

## 2013-04-22 ENCOUNTER — Ambulatory Visit (HOSPITAL_BASED_OUTPATIENT_CLINIC_OR_DEPARTMENT_OTHER): Payer: BC Managed Care – PPO | Admitting: Oncology

## 2013-04-22 ENCOUNTER — Other Ambulatory Visit (HOSPITAL_BASED_OUTPATIENT_CLINIC_OR_DEPARTMENT_OTHER): Payer: BC Managed Care – PPO

## 2013-04-22 ENCOUNTER — Telehealth: Payer: Self-pay | Admitting: Oncology

## 2013-04-22 VITALS — BP 112/66 | HR 61 | Temp 97.7°F | Resp 18 | Ht 70.5 in | Wt 175.3 lb

## 2013-04-22 DIAGNOSIS — C9210 Chronic myeloid leukemia, BCR/ABL-positive, not having achieved remission: Secondary | ICD-10-CM

## 2013-04-22 LAB — CBC WITH DIFFERENTIAL/PLATELET
BASO%: 0.3 % (ref 0.0–2.0)
EOS%: 0.6 % (ref 0.0–7.0)
Eosinophils Absolute: 0 10*3/uL (ref 0.0–0.5)
LYMPH%: 30.5 % (ref 14.0–49.0)
MCHC: 33.2 g/dL (ref 32.0–36.0)
MONO#: 0.1 10*3/uL (ref 0.1–0.9)
NEUT#: 2.7 10*3/uL (ref 1.5–6.5)
RBC: 4.21 10*6/uL (ref 4.20–5.82)
RDW: 18.2 % — ABNORMAL HIGH (ref 11.0–14.6)
WBC: 4 10*3/uL (ref 4.0–10.3)
lymph#: 1.2 10*3/uL (ref 0.9–3.3)

## 2013-04-22 LAB — TECHNOLOGIST REVIEW

## 2013-04-22 NOTE — Addendum Note (Signed)
Addended by: Reesa Chew on: 04/22/2013 10:31 AM   Modules accepted: Medications

## 2013-04-22 NOTE — Telephone Encounter (Signed)
pt states lab and ov in 4 mos Lab orders in for 04/16 AVS and CAL given shh

## 2013-04-22 NOTE — Progress Notes (Signed)
Hematology and Oncology Follow Up Visit  Nathan Kerr [id number] August 25, 1985 27 y.o. 04/22/2013 10:00 AM   CC: [name], M.D.    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   Prior Therapy:  1. Patient treated with Gleevec 400 mg daily between April of 2010 until September of 2010. 2.     Patient treated with Gleevec 200 mg daily due to severe thrombocytopenia and subsequently switched to Tasigna for lack of efficacy and poor tolerance.   Current therapy: He is on Tasigna 300 mg tablet b.i.d. total 600 mg daily, started in August of 2011.  He had a complete hematological remission and near molecular remission at this time.   Interim History: Nathan Kerr presents today for a followup visit.  He is a very pleasant gentleman with chronic phase CML.  He failed to show up on multiple occasions since August of 2013 and have been very erratic and noncompliant with his medication. In the last few weeks, he has been more diligent in taking his medications on time. He had felt perfectly normal at this time. He is no longer reporting any headaches or abdominal pain. Has not reported any early satiety or constitutional symptoms.  Medications: I have reviewed the patient's current medications. Current Outpatient Prescriptions  Medication Sig Dispense Refill  . aspirin 325 MG tablet Take 650 mg by mouth every 4 (four) hours as needed for headache.      . loratadine (CLARITIN) 10 MG tablet Take 10 mg by mouth daily as needed for allergies (headache).      . meloxicam (MOBIC) 15 MG tablet Take 1 tablet (15 mg total) by mouth daily.  30 tablet  0  . nilotinib (TASIGNA) 150 MG capsule Take 2 capsules (300 mg total) by mouth every 12 (twelve) hours.  120 capsule  0  . ondansetron (ZOFRAN ODT) 8 MG disintegrating tablet Take 1 tablet (8 mg total) by mouth every 8 (eight) hours as needed for nausea or vomiting.  20 tablet   2  . oxyCODONE-acetaminophen (PERCOCET) 10-325 MG per tablet Take 1 tablet by mouth every 6 (six) hours as needed for pain.  30 tablet  0  . promethazine (PHENERGAN) 25 MG tablet Take 1 tablet (25 mg total) by mouth every 6 (six) hours as needed for nausea or vomiting.  20 tablet  0  . SUMAtriptan (IMITREX) 100 MG tablet Take 1 tab at onset of HA. May repeat in 2 hours if headache persists or recurs. No more than 2 tabs in 24 hrs or 4 tabs in a week.  10 tablet  1   Current Facility-Administered Medications  Medication Dose Route Frequency Provider Last Rate Last Dose  . ondansetron (ZOFRAN-ODT) disintegrating tablet 8 mg  8 mg Oral Once Tonye Pearson, MD        Allergies: No Known Allergies  Past Medical History, Surgical history, Social history, and Family History were reviewed and updated.  Review of Systems:  Remaining ROS negative.  Physical Exam: Blood pressure 112/66, pulse 61, temperature 97.7 F (36.5 C), temperature source Oral, resp. rate 18, height 5' 10.5" (1.791 m), weight 175 lb 4.8 oz (79.516 kg). ECOG: 0 General appearance: alert Head: Normocephalic, without obvious abnormality, atraumatic Neck: no adenopathy, no carotid bruit, no JVD, supple, symmetrical, trachea midline and thyroid not enlarged, symmetric, no tenderness/mass/nodules Lymph nodes: Cervical, supraclavicular, and axillary nodes normal. Heart:regular rate and rhythm, S1, S2 normal, no murmur,  click, rub or gallop Lung:chest clear, no wheezing, rales, normal symmetric air entry Abdomen: Soft and nontender. He does have an enlarged spleen but haven't regressed compared to last week's examination. EXT:no erythema, induration, or nodules Neurological exam: No deficits retinal hemorrhages noted on examination  Lab Results: Lab Results  Component Value Date   WBC 4.0 04/22/2013   HGB 12.3* 04/22/2013   HCT 37.0* 04/22/2013   MCV 87.8 04/22/2013   PLT 145 04/22/2013     Chemistry      Component  Value Date/Time   NA 140 04/15/2013 0820   NA 142 03/11/2013 1321   K 3.9 04/15/2013 0820   K 3.2* 03/11/2013 1321   CL 104 03/11/2013 1321   CO2 22 04/15/2013 0820   CO2 28 03/11/2013 1321   BUN 13.9 04/15/2013 0820   BUN 16 03/11/2013 1321   CREATININE 0.9 04/15/2013 0820   CREATININE 1.02 03/11/2013 1321   CREATININE 0.96 12/27/2011 1019      Component Value Date/Time   CALCIUM 9.4 04/15/2013 0820   CALCIUM 9.6 03/11/2013 1321   ALKPHOS 60 04/15/2013 0820   ALKPHOS 66 03/11/2013 1321   AST 12 04/15/2013 0820   AST 23 03/11/2013 1321   ALT 18 04/15/2013 0820   ALT 16 03/11/2013 1321   BILITOT 0.97 04/15/2013 0820   BILITOT 1.4* 03/11/2013 1321      Impression and Plan:  27 year old gentleman with the following issues: 1. Chronic phase chronic myelogenous leukemia.  His white cell count has perfectly normalized at this time after strict adherence to the medication regimen. I see no evidence to suggest transformation to an accelerated phase at this point. The plan is to continue him on the same dose and schedule.  2. Cytopenia.  This is likely related to the medication currently have resolved. 3. Follow-up. Will be in 4 months for now.   Nathan Kerr 12/16/201410:00 AM

## 2013-08-21 ENCOUNTER — Encounter: Payer: Self-pay | Admitting: Oncology

## 2013-08-21 ENCOUNTER — Ambulatory Visit (HOSPITAL_BASED_OUTPATIENT_CLINIC_OR_DEPARTMENT_OTHER): Payer: BC Managed Care – PPO | Admitting: Oncology

## 2013-08-21 ENCOUNTER — Other Ambulatory Visit (HOSPITAL_BASED_OUTPATIENT_CLINIC_OR_DEPARTMENT_OTHER): Payer: BC Managed Care – PPO

## 2013-08-21 VITALS — BP 139/81 | HR 66 | Temp 97.6°F | Resp 18 | Ht 70.0 in | Wt 195.9 lb

## 2013-08-21 DIAGNOSIS — C9210 Chronic myeloid leukemia, BCR/ABL-positive, not having achieved remission: Secondary | ICD-10-CM

## 2013-08-21 LAB — CBC WITH DIFFERENTIAL/PLATELET
BASO%: 0.9 % (ref 0.0–2.0)
Basophils Absolute: 0 10*3/uL (ref 0.0–0.1)
EOS%: 1.4 % (ref 0.0–7.0)
Eosinophils Absolute: 0.1 10*3/uL (ref 0.0–0.5)
HEMATOCRIT: 41.7 % (ref 38.4–49.9)
HEMOGLOBIN: 14.2 g/dL (ref 13.0–17.1)
LYMPH%: 34.3 % (ref 14.0–49.0)
MCH: 29.6 pg (ref 27.2–33.4)
MCHC: 34.1 g/dL (ref 32.0–36.0)
MCV: 87.1 fL (ref 79.3–98.0)
MONO#: 0.3 10*3/uL (ref 0.1–0.9)
MONO%: 7.1 % (ref 0.0–14.0)
NEUT#: 2.5 10*3/uL (ref 1.5–6.5)
NEUT%: 56.3 % (ref 39.0–75.0)
Platelets: 129 10*3/uL — ABNORMAL LOW (ref 140–400)
RBC: 4.79 10*6/uL (ref 4.20–5.82)
RDW: 16.1 % — ABNORMAL HIGH (ref 11.0–14.6)
WBC: 4.4 10*3/uL (ref 4.0–10.3)
lymph#: 1.5 10*3/uL (ref 0.9–3.3)

## 2013-08-21 LAB — COMPREHENSIVE METABOLIC PANEL (CC13)
ALT: 28 U/L (ref 0–55)
ANION GAP: 10 meq/L (ref 3–11)
AST: 14 U/L (ref 5–34)
Albumin: 4.1 g/dL (ref 3.5–5.0)
Alkaline Phosphatase: 64 U/L (ref 40–150)
BUN: 15.5 mg/dL (ref 7.0–26.0)
CO2: 24 mEq/L (ref 22–29)
CREATININE: 0.8 mg/dL (ref 0.7–1.3)
Calcium: 9.5 mg/dL (ref 8.4–10.4)
Chloride: 107 mEq/L (ref 98–109)
Glucose: 105 mg/dl (ref 70–140)
Potassium: 3.9 mEq/L (ref 3.5–5.1)
Sodium: 141 mEq/L (ref 136–145)
TOTAL PROTEIN: 7.4 g/dL (ref 6.4–8.3)
Total Bilirubin: 1.64 mg/dL — ABNORMAL HIGH (ref 0.20–1.20)

## 2013-08-21 NOTE — Progress Notes (Signed)
Hematology and Oncology Follow Up Visit  Nathan Kerr [id number] 1986-02-02 28 y.o. 08/21/2013 10:23 AM   CC: Nathan Kerr, M.D.    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   Prior Therapy:  1. Patient treated with Gleevec 400 mg daily between April of 2010 until September of 2010. 2.     Patient treated with Gleevec 200 mg daily due to severe thrombocytopenia and subsequently switched to Tasigna for lack of efficacy and poor tolerance.   Current therapy: He is on Tasigna 300 mg tablet b.i.d. total 600 mg daily, started in August of 2011.  He had a complete hematological remission and near molecular remission at this time. He presented with a relapse in November of 2014 with elevated white cell count close to 300,000 range after he stopped taking his medication due to poor compliance. He was restarted in November of 2014 and continues to do well since.  Interim History: Nathan Kerr presents today for a followup visit.  He is a very pleasant gentleman with chronic phase CML. In the last few months, he has been more diligent in taking his medications on time. He had felt perfectly normal at this time. He is no longer reporting any headaches or abdominal pain. Has not reported any early satiety or constitutional symptoms. He continues to work full-time and is not reporting any complication of this medication. Is not reporting any edema or abdominal distention. Has not reported any shortness of breath or cough. Has not reported any hemoptysis or hematemesis. Has not reported easy bruisability or bleeding.  Medications: I have reviewed the patient's current medications. Current Outpatient Prescriptions  Medication Sig Dispense Refill  . aspirin 325 MG tablet Take 650 mg by mouth every 4 (four) hours as needed for headache.      . loratadine (CLARITIN) 10 MG tablet Take 10 mg by mouth daily as needed for  allergies (headache).      . meloxicam (MOBIC) 15 MG tablet Take 1 tablet (15 mg total) by mouth daily.  30 tablet  0  . nilotinib (TASIGNA) 150 MG capsule Take 2 capsules (300 mg total) by mouth every 12 (twelve) hours.  120 capsule  0  . ondansetron (ZOFRAN ODT) 8 MG disintegrating tablet Take 1 tablet (8 mg total) by mouth every 8 (eight) hours as needed for nausea or vomiting.  20 tablet  2  . oxyCODONE-acetaminophen (PERCOCET) 10-325 MG per tablet Take 1 tablet by mouth every 6 (six) hours as needed for pain.  30 tablet  0  . promethazine (PHENERGAN) 25 MG tablet Take 1 tablet (25 mg total) by mouth every 6 (six) hours as needed for nausea or vomiting.  20 tablet  0  . SUMAtriptan (IMITREX) 100 MG tablet Take 1 tab at onset of HA. May repeat in 2 hours if headache persists or recurs. No more than 2 tabs in 24 hrs or 4 tabs in a week.  10 tablet  1   Current Facility-Administered Medications  Medication Dose Route Frequency Provider Last Rate Last Dose  . ondansetron (ZOFRAN-ODT) disintegrating tablet 8 mg  8 mg Oral Once Leandrew Koyanagi, MD        Allergies: No Known Allergies  Past Medical History, Surgical history, Social history, and Family History were reviewed and updated.  Review of Systems:  Remaining ROS negative.  Physical Exam: Blood pressure 139/81, pulse 66, temperature 97.6 F (36.4 C), temperature source Oral,  resp. rate 18, height 5\' 10"  (1.778 m), weight 195 lb 14.4 oz (88.86 kg), SpO2 100.00%. ECOG: 0 General appearance: alert awake not in any distress. Head: Normocephalic, without obvious abnormality, atraumatic Neck: no adenopathy, no carotid bruit, no JVD, supple, symmetrical, trachea midline and thyroid not enlarged, symmetric, no tenderness/mass/nodules Lymph nodes: Cervical, supraclavicular, and axillary nodes normal. Heart:regular rate and rhythm, S1, S2 normal, no murmur, click, rub or gallop Lung:chest clear, no wheezing, rales, normal symmetric air  entry Abdomen: Soft and nontender. Could not appreciate any splenomegaly. EXT:no erythema, induration, or nodules Neurological exam: No deficits retinal hemorrhages noted on examination  Lab Results: Lab Results  Component Value Date   WBC 4.4 08/21/2013   HGB 14.2 08/21/2013   HCT 41.7 08/21/2013   MCV 87.1 08/21/2013   PLT 129* 08/21/2013     Chemistry      Component Value Date/Time   NA 141 08/21/2013 0941   NA 142 03/11/2013 1321   K 3.9 08/21/2013 0941   K 3.2* 03/11/2013 1321   CL 104 03/11/2013 1321   CO2 24 08/21/2013 0941   CO2 28 03/11/2013 1321   BUN 15.5 08/21/2013 0941   BUN 16 03/11/2013 1321   CREATININE 0.8 08/21/2013 0941   CREATININE 1.02 03/11/2013 1321   CREATININE 0.96 12/27/2011 1019      Component Value Date/Time   CALCIUM 9.5 08/21/2013 0941   CALCIUM 9.6 03/11/2013 1321   ALKPHOS 64 08/21/2013 0941   ALKPHOS 66 03/11/2013 1321   AST 14 08/21/2013 0941   AST 23 03/11/2013 1321   ALT 28 08/21/2013 0941   ALT 16 03/11/2013 1321   BILITOT 1.64* 08/21/2013 0941   BILITOT 1.4* 03/11/2013 1321      Impression and Plan:  28 year old gentleman with the following issues: 1. Chronic phase chronic myelogenous leukemia.  His white cell count has perfectly normalized at this time after strict adherence to the medication regimen. I see no evidence to suggest transformation to an accelerated phase at this point. The plan is to continue him on the same dose and schedule.  2. Cytopenia.  This is likely related to the medication currently have resolved. Mild thrombocytopenia we will continue to monitor. 3. Follow-up. Will be in 4 months for now.   Wyatt Portela 4/16/201510:23 AM

## 2013-08-22 ENCOUNTER — Telehealth: Payer: Self-pay | Admitting: Oncology

## 2013-08-22 NOTE — Telephone Encounter (Signed)
s.w. pt and advised on Aug appt....pt ok and aware °

## 2013-10-15 ENCOUNTER — Ambulatory Visit (INDEPENDENT_AMBULATORY_CARE_PROVIDER_SITE_OTHER): Payer: BC Managed Care – PPO | Admitting: Family Medicine

## 2013-10-15 ENCOUNTER — Encounter: Payer: Self-pay | Admitting: Family Medicine

## 2013-10-15 VITALS — BP 122/70 | HR 67 | Temp 97.7°F | Resp 18 | Ht 68.75 in | Wt 198.8 lb

## 2013-10-15 DIAGNOSIS — C9210 Chronic myeloid leukemia, BCR/ABL-positive, not having achieved remission: Secondary | ICD-10-CM

## 2013-10-15 DIAGNOSIS — R040 Epistaxis: Secondary | ICD-10-CM

## 2013-10-15 DIAGNOSIS — J309 Allergic rhinitis, unspecified: Secondary | ICD-10-CM

## 2013-10-15 DIAGNOSIS — G43909 Migraine, unspecified, not intractable, without status migrainosus: Secondary | ICD-10-CM

## 2013-10-15 DIAGNOSIS — J0100 Acute maxillary sinusitis, unspecified: Secondary | ICD-10-CM

## 2013-10-15 LAB — CBC WITH DIFFERENTIAL/PLATELET
BASOS ABS: 0.1 10*3/uL (ref 0.0–0.1)
BASOS PCT: 1 % (ref 0–1)
Eosinophils Absolute: 0 10*3/uL (ref 0.0–0.7)
Eosinophils Relative: 0 % (ref 0–5)
HCT: 41.7 % (ref 39.0–52.0)
HEMOGLOBIN: 14.8 g/dL (ref 13.0–17.0)
Lymphocytes Relative: 35 % (ref 12–46)
Lymphs Abs: 2.1 10*3/uL (ref 0.7–4.0)
MCH: 30.5 pg (ref 26.0–34.0)
MCHC: 35.5 g/dL (ref 30.0–36.0)
MCV: 85.8 fL (ref 78.0–100.0)
Monocytes Absolute: 0.4 10*3/uL (ref 0.1–1.0)
Monocytes Relative: 7 % (ref 3–12)
NEUTROS ABS: 3.4 10*3/uL (ref 1.7–7.7)
Neutrophils Relative %: 57 % (ref 43–77)
PLATELETS: 117 10*3/uL — AB (ref 150–400)
RBC: 4.86 MIL/uL (ref 4.22–5.81)
RDW: 15.5 % (ref 11.5–15.5)
WBC: 5.9 10*3/uL (ref 4.0–10.5)

## 2013-10-15 MED ORDER — CEFDINIR 300 MG PO CAPS: 600.0000 mg | ORAL_CAPSULE | Freq: Every day | ORAL | Status: DC

## 2013-10-15 MED ORDER — MOMETASONE FUROATE 50 MCG/ACT NA SUSP: 2.0000 | Freq: Every day | NASAL | Status: DC

## 2013-10-15 MED ORDER — PROMETHAZINE HCL 25 MG PO TABS: 25.0000 mg | ORAL_TABLET | Freq: Four times a day (QID) | ORAL | Status: DC | PRN

## 2013-10-15 MED ORDER — MELOXICAM 15 MG PO TABS: 15.0000 mg | ORAL_TABLET | Freq: Every day | ORAL | Status: DC

## 2013-10-15 MED ORDER — SUMATRIPTAN SUCCINATE 100 MG PO TABS: ORAL_TABLET | ORAL | Status: DC

## 2013-10-15 MED ORDER — AZELASTINE HCL 0.1 % NA SOLN: 2.0000 | Freq: Two times a day (BID) | NASAL | Status: DC

## 2013-10-15 NOTE — Progress Notes (Signed)
Subjective:  This chart was scribed for Wardell Honour, MD by Ladene Artist, ED Scribe. The patient was seen in room 28. Patient's care was started at 10:12 AM.   Patient ID: Nathan Kerr, male    DOB: 1985-10-05, 28 y.o.   MRN: [id number]  10/15/2013  Allergic Rhinitis  and Headache   HPI HPI Comments: Nathan Kerr is a 28 y.o. male who presents to the [facility] complaining of headache. Pt was seen by Dr. Laney Pastor in November 2014, 1 week before his wedding, for severe migraine HA. He continued Zofran and gave him Meloxicam, Imitrex and Percocet. Ordered CT of head that was negative. Pt was referred to neurologist. Pt canceled this appointment due to inability to get off of work.   Pt reports gradually worsening allergies since the end of April. He reports associated congestion, sinus pressure, nosebleeds, itchy eyes, watery eyes, pressure behind eyes, constant HA that he rates 2-3/10. Pt states that his nasal congestion is so serve that it is difficulty to breathe. He describes the congestion as "an inflamed swollen knot" in his nose. Pt states that his nosebleeds last for 5-10 minutes but he reports a nosebleed that lasted 1 hour yesterday. He denies fever, chills, diaphoresis, photophobia, blurred vision, dizziness, numbness/tingling, confusion, postnasal drip, cough, wheezing, ear pain, nausea. Pt takes ASA as needed for HA. Pt has tried Flonase, Zyrtec, Claritin for 1 week, Amoxicillin for 2 weeks with no relief. Pt states that he stopped taking all medications 2 weeks ago since nothing was working. Pt states that he had a sinus infection in March 2013 and was given Omnicef with relief.   Pt states that he has managed to avoid vomiting with migraines. He takes Meloxicam first and proceeds with Imitrex and Percocet if he needs to. He only reports 2 migraines since November and 12 potential migraines that he managed to stop.  Pt has a h/o asthma and migraines as a  child. No known allergies. No h/o thyroid disease. Pt's mother is 60 y.o with h/o sleep apnea. Pt's father is 86 y.o with a h/o migraines. Pt's brother and sister do not have any pertinent medical history.   Pt drinks a beer or Bourbon every 2-4 days. He denies smoking. Pt works in Press photographer for [employer].   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  Review of Systems  Constitutional: Negative for fever, chills and diaphoresis.  HENT: Positive for congestion, nosebleeds and sinus pressure. Negative for ear pain, postnasal drip, rhinorrhea and sore throat.   Eyes: Positive for pain (pressure), discharge (watery) and itching. Negative for photophobia and visual disturbance.  Respiratory: Negative for cough, shortness of breath and wheezing.   Gastrointestinal: Negative for nausea and vomiting.  Allergic/Immunologic: Positive for environmental allergies.  Neurological: Positive for headaches. Negative for dizziness, tremors, seizures, syncope, facial asymmetry, speech difficulty, weakness and numbness.  Hematological: Negative for adenopathy. Does not bruise/bleed easily.  Psychiatric/Behavioral: Negative for confusion.    Past Medical History  Diagnosis Date  . Chronic myeloid leukemia in remission   . CML (chronic myelocytic leukemia)   . Allergy   . Asthma   . Migraine    No Known Allergies Current Outpatient Prescriptions  Medication Sig Dispense Refill  . aspirin 325 MG tablet Take 650 mg by mouth every 4 (four) hours as needed for headache.      . loratadine (CLARITIN) 10 MG tablet Take 10 mg by mouth daily as needed for  allergies (headache).      . meloxicam (MOBIC) 15 MG tablet Take 1 tablet (15 mg total) by mouth daily.  30 tablet  5  . nilotinib (TASIGNA) 150 MG capsule Take 2 capsules (300 mg total) by mouth every 12 (twelve) hours.  120 capsule  0  . oxyCODONE-acetaminophen (PERCOCET) 10-325 MG per tablet Take 1 tablet by mouth every 6 (six) hours as needed for  pain.  30 tablet  0  . promethazine (PHENERGAN) 25 MG tablet Take 1 tablet (25 mg total) by mouth every 6 (six) hours as needed for nausea or vomiting.  30 tablet  5  . SUMAtriptan (IMITREX) 100 MG tablet Take 1 tab at onset of HA. May repeat in 2 hours if headache persists or recurs. No more than 2 tabs in 24 hrs or 4 tabs in a week.  9 tablet  4  . azelastine (ASTELIN) 0.1 % nasal spray Place 2 sprays into both nostrils 2 (two) times daily. Use in each nostril as directed  30 mL  12  . cefdinir (OMNICEF) 300 MG capsule Take 2 capsules (600 mg total) by mouth daily.  30 capsule  0  . mometasone (NASONEX) 50 MCG/ACT nasal spray Place 2 sprays into the nose daily.  17 g  12   Current Facility-Administered Medications  Medication Dose Route Frequency Provider Last Rate Last Dose  . ondansetron (ZOFRAN-ODT) disintegrating tablet 8 mg  8 mg Oral Once Leandrew Koyanagi, MD       History   Social History  . Marital Status: Married    Spouse Name: N/A    Number of Children: 0  . Years of Education: N/A   Occupational History  .      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 Smoking status: Never Smoker   . Smokeless tobacco: Not on file  . Alcohol Use: Yes     Comment: occasional  . Drug Use: No  . Sexual Activity: Yes    Partners: Female   Other Topics Concern  . Not on file   Social History Narrative  . No narrative on file   Family History  Problem Relation Age of Onset  . Sleep apnea Mother   . Migraines Father        Objective:    Triage Vitals: BP 122/70  Pulse 67  Temp(Src) 97.7 F (36.5 C) (Oral)  Resp 18  Ht 5' 8.75" (1.746 m)  Wt 198 lb 12.8 oz (90.175 kg)  BMI 29.58 kg/m2  SpO2 98% Physical Exam  Nursing note and vitals reviewed. Constitutional: He is oriented to person, place, and time. He appears well-developed and well-nourished. No distress.  HENT:  Head: Normocephalic and atraumatic.  Right Ear: Tympanic membrane and external ear normal.   Left Ear: Tympanic membrane and external ear normal.  Nose: Mucosal edema and rhinorrhea present. No epistaxis. Right sinus exhibits maxillary sinus tenderness. Right sinus exhibits no frontal sinus tenderness. Left sinus exhibits maxillary sinus tenderness. Left sinus exhibits no frontal sinus tenderness.  Mouth/Throat: Oropharynx is clear and moist. No oropharyngeal exudate, posterior oropharyngeal edema or posterior oropharyngeal erythema.  Nose: Crusting, dry, irritated Mouth: Lots of drainage  Eyes: Conjunctivae and EOM are normal.  Neck: Normal range of motion.  Pulmonary/Chest: Effort normal and breath sounds normal.  Musculoskeletal: Normal range of motion.  Neurological: He is alert and oriented to person, place, and time.  Skin: Skin is warm and dry. He is not diaphoretic.  Psychiatric: He has a normal mood and affect. His behavior is normal.   Results for orders placed in visit on 10/15/13  CBC WITH DIFFERENTIAL      Result Value Ref Range   WBC 5.9  4.0 - 10.5 K/uL   RBC 4.86  4.22 - 5.81 MIL/uL   Hemoglobin 14.8  13.0 - 17.0 g/dL   HCT 41.7  39.0 - 52.0 %   MCV 85.8  78.0 - 100.0 fL   MCH 30.5  26.0 - 34.0 pg   MCHC 35.5  30.0 - 36.0 g/dL   RDW 15.5  11.5 - 15.5 %   Platelets 117 (*) 150 - 400 K/uL   Neutrophils Relative % 57  43 - 77 %   Neutro Abs 3.4  1.7 - 7.7 K/uL   Lymphocytes Relative 35  12 - 46 %   Lymphs Abs 2.1  0.7 - 4.0 K/uL   Monocytes Relative 7  3 - 12 %   Monocytes Absolute 0.4  0.1 - 1.0 K/uL   Eosinophils Relative 0  0 - 5 %   Eosinophils Absolute 0.0  0.0 - 0.7 K/uL   Basophils Relative 1  0 - 1 %   Basophils Absolute 0.1  0.0 - 0.1 K/uL   Smear Review Criteria for review not met        Assessment & Plan:   1. Migraine   2. Allergic rhinitis, cause unspecified   3. Acute maxillary sinusitis   4. Epistaxis   5. CML (chronic myelocytic leukemia)    1. Migraine: improved from last visit but continues to suffer with persistent headaches.  Refill of Imitrex, Phenergan, Mobic provided.  Keep headache diary for the next 3 months to follow migraine headaches closely.  If having migraine headaches weekly, will warrant prophylactic medication. 2.  Allergic Rhinitis: Uncontrolled; start Zyrtec 57m daily; rx for Nasonex provided; start Astelin nasal spray as well. 3.  Acute maxillary sinusitis:  New. Rx for Omnicef provided. 4.  Epistaxis: Chronic with recent worsening; CBC stable; secondary to allergic rhinitis. 5.  CML: stable; followed by oncology closely.  Meds ordered this encounter  Medications  . cefdinir (OMNICEF) 300 MG capsule    Sig: Take 2 capsules (600 mg total) by mouth daily.    Dispense:  30 capsule    Refill:  0  . mometasone (NASONEX) 50 MCG/ACT nasal spray    Sig: Place 2 sprays into the nose daily.    Dispense:  17 g    Refill:  12  . azelastine (ASTELIN) 0.1 % nasal spray    Sig: Place 2 sprays into both nostrils 2 (two) times daily. Use in each nostril as directed    Dispense:  30 mL    Refill:  12  . meloxicam (MOBIC) 15 MG tablet    Sig: Take 1 tablet (15 mg total) by mouth daily.    Dispense:  30 tablet    Refill:  5  . DISCONTD: promethazine (PHENERGAN) 25 MG tablet    Sig: Take 1 tablet (25 mg total) by mouth every 6 (six) hours as needed for nausea or vomiting.    Dispense:  30 tablet    Refill:  5    Order Specific Question:  Supervising Provider    Answer:  MNoemi ChapelD [[[id number]] . SUMAtriptan (IMITREX) 100 MG tablet    Sig: Take 1 tab at onset of HA. May repeat in 2 hours if headache persists or recurs. No more than  2 tabs in 24 hrs or 4 tabs in a week.    Dispense:  9 tablet    Refill:  4  . promethazine (PHENERGAN) 25 MG tablet    Sig: Take 1 tablet (25 mg total) by mouth every 6 (six) hours as needed for nausea or vomiting.    Dispense:  30 tablet    Refill:  5    Order Specific Question:  Supervising Provider    Answer:  Noemi Chapel D [[id number]]    Return in about 3 months (around  01/15/2014) for recheck.   I personally performed the services described in this documentation, which was scribed in my presence. The recorded information has been reviewed and is accurate.  Reginia Forts, M.D.  [facility] [address] [place], [place]  [address] [phone] phone [phone] fax

## 2013-10-15 NOTE — Patient Instructions (Addendum)
1.  Call if no improvement in 2 weeks.  2. Start Zyrtec 10mg  one tablet daily for the next month. 3.  Keep diary of headaches.

## 2013-10-16 ENCOUNTER — Encounter: Payer: Self-pay | Admitting: Family Medicine

## 2013-10-16 DIAGNOSIS — J309 Allergic rhinitis, unspecified: Secondary | ICD-10-CM | POA: Insufficient documentation

## 2013-10-30 ENCOUNTER — Telehealth: Payer: Self-pay

## 2013-10-30 NOTE — Telephone Encounter (Signed)
Patient states that he has taken all the medicines that he was prescribed but is not getting any better.  He states that Dr. Tamala Julian told him that he should call if not improved.  He asked if there were any appts for tomorrow but there were not.  He said his wife has scheduled him an appt for Monday and he may keep that or he may walk in at 102 on Friday.  I asked him if he still needed a return call from Dr. Tamala Julian. He stated that he knew that Dr. Tamala Julian was very busy and if she felt like he needed to speak to her then to call him, otherwise he would just follow up.  He mainly had wanted her to be aware.  I told him that I would forward the message to Dr. Tamala Julian.  He was very Patent attorney.

## 2013-10-30 NOTE — Telephone Encounter (Signed)
Dr. Tamala Julian - patients wife called on his behalf.  He was seen approx. 2 weeks ago for acute sinusitis and was told if not better in 2 weeks to call.  He is not feeling better.  Made an appt. For Monday, June 29 but would also like to talk to you.  Number is [phone].

## 2013-11-03 ENCOUNTER — Encounter: Payer: Self-pay | Admitting: Family Medicine

## 2013-11-03 ENCOUNTER — Ambulatory Visit (INDEPENDENT_AMBULATORY_CARE_PROVIDER_SITE_OTHER): Payer: BC Managed Care – PPO | Admitting: Family Medicine

## 2013-11-03 VITALS — BP 120/78 | HR 89 | Temp 98.4°F | Resp 16 | Ht 69.5 in | Wt 200.6 lb

## 2013-11-03 DIAGNOSIS — J0100 Acute maxillary sinusitis, unspecified: Secondary | ICD-10-CM

## 2013-11-03 DIAGNOSIS — J0101 Acute recurrent maxillary sinusitis: Secondary | ICD-10-CM

## 2013-11-03 DIAGNOSIS — C9210 Chronic myeloid leukemia, BCR/ABL-positive, not having achieved remission: Secondary | ICD-10-CM

## 2013-11-03 DIAGNOSIS — J309 Allergic rhinitis, unspecified: Secondary | ICD-10-CM

## 2013-11-03 MED ORDER — DOXYCYCLINE HYCLATE 100 MG PO CAPS: 100.0000 mg | ORAL_CAPSULE | Freq: Two times a day (BID) | ORAL | Status: DC

## 2013-11-03 NOTE — Patient Instructions (Signed)
1.  Start nasal saline to nose twice daily.

## 2013-11-03 NOTE — Progress Notes (Signed)
Subjective:  This chart was scribed for Wardell Honour, MD by Ladene Artist, ED Scribe. The patient was seen in room 22. Patient's care was started at 10:47 AM.   Patient ID: Nathan Kerr, male    DOB: [birth year], 28 y.o.   MRN: [id number]  11/03/2013  Follow-up and Sinusitis  HPI HPI Comments: Nathan Kerr is a 28 y.o. male who presents to the [facility] for a 3 week follow-up for acute sinusitis and allergic rhinitis. Pt was prescribed Omnicef, Allegra, Nasonex and Astelin. Pt reports consistent congestion. Pt states that he feels like his nose has been broken. He states that he sleeps a little better and now sleeps for 4-5 hours per night with naps during the day on his days off. Pt states that he is still breathing through his mouth all night and he reports waking up 2-3 times per night. He reports associated sinus pressure, intermittent teeth pain and eye watering at night. Pt states that his HA have resolved, nosebleeds have decreased in frequency and duration, and postnasal drip has improved. Pt also reports a burning sensation in his nostrils with breathing. He denies fever, sore throat, ear pain, cough. He states that he has changed his [place] the [place] and has washed everything, but states that it does not seem to be environmental. Pt states that Astelin loosens everything up for 20-30 minutes. Pt has never seen an ENT. Patient stopped Nasonex due to lack of improvement.  He continues to use Allegra and Astelin daily; he completed Omnicef four days ago.  He also reports intermittent sore scalp that he feels is unrelated to sinusitis.   Review of Systems  Constitutional: Positive for fatigue. Negative for fever, chills and diaphoresis.  HENT: Positive for congestion, nosebleeds (improving), postnasal drip (mild) and sinus pressure. Negative for ear discharge, ear pain and sore throat.   Eyes: Eye discharge: watering.  Respiratory: Negative for  cough, shortness of breath and stridor.   Neurological: Negative for dizziness, light-headedness and headaches.   Past Medical History  Diagnosis Date  . Chronic myeloid leukemia in remission   . CML (chronic myelocytic leukemia)   . Allergy   . Asthma   . Migraine    No Known Allergies Current Outpatient Prescriptions  Medication Sig Dispense Refill  . aspirin 325 MG tablet Take 650 mg by mouth every 4 (four) hours as needed for headache.      . loratadine (CLARITIN) 10 MG tablet Take 10 mg by mouth daily as needed for allergies (headache).      . meloxicam (MOBIC) 15 MG tablet Take 1 tablet (15 mg total) by mouth daily.  30 tablet  5  . mometasone (NASONEX) 50 MCG/ACT nasal spray Place 2 sprays into the nose daily.  17 g  12  . nilotinib (TASIGNA) 150 MG capsule Take 2 capsules (300 mg total) by mouth every 12 (twelve) hours.  120 capsule  0  . oxyCODONE-acetaminophen (PERCOCET) 10-325 MG per tablet Take 1 tablet by mouth every 6 (six) hours as needed for pain.  30 tablet  0  . promethazine (PHENERGAN) 25 MG tablet Take 1 tablet (25 mg total) by mouth every 6 (six) hours as needed for nausea or vomiting.  30 tablet  5  . SUMAtriptan (IMITREX) 100 MG tablet Take 1 tab at onset of HA. May repeat in 2 hours if headache persists or recurs. No more than 2 tabs in 24 hrs or 4 tabs in  a week.  9 tablet  4  . azelastine (ASTELIN) 0.1 % nasal spray Place 2 sprays into both nostrils 2 (two) times daily. Use in each nostril as directed  30 mL  12  . cefdinir (OMNICEF) 300 MG capsule Take 2 capsules (600 mg total) by mouth daily.  30 capsule  0  . doxycycline (VIBRAMYCIN) 100 MG capsule Take 1 capsule (100 mg total) by mouth 2 (two) times daily.  30 capsule  0   Current Facility-Administered Medications  Medication Dose Route Frequency Provider Last Rate Last Dose  . ondansetron (ZOFRAN-ODT) disintegrating tablet 8 mg  8 mg Oral Once Leandrew Koyanagi, MD       History   Social History  .  Marital Status: Married    Spouse Name: N/A    Number of Children: 0  . Years of Education: N/A   Occupational History  .      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 Smoking status: Never Smoker   . Smokeless tobacco: Not on file  . Alcohol Use: Yes     Comment: occasional  . Drug Use: No  . Sexual Activity: Yes    Partners: Female   Other Topics Concern  . Not on file   Social History Narrative  . No narrative on file   Family History  Problem Relation Age of Onset  . Sleep apnea Mother   . Migraines Father         Objective:    Triage Vitals: BP 120/78  Pulse 89  Temp(Src) 98.4 F (36.9 C) (Oral)  Resp 16  Ht 5' 9.5" (1.765 m)  Wt 200 lb 9.6 oz (90.992 kg)  BMI 29.21 kg/m2  SpO2 97% Physical Exam  Nursing note and vitals reviewed. Constitutional: He is oriented to person, place, and time. He appears well-developed and well-nourished. No distress.  HENT:  Head: Normocephalic and atraumatic.  Right Ear: External ear normal.  Left Ear: External ear normal.  Nose: Mucosal edema and rhinorrhea present. Right sinus exhibits maxillary sinus tenderness and frontal sinus tenderness. Left sinus exhibits maxillary sinus tenderness and frontal sinus tenderness.  Mouth/Throat: Posterior oropharyngeal erythema (mild) present.  Nose:  Dried residual blood along septums bilaterally Mouth:  Drainage  Eyes: Conjunctivae and EOM are normal. Pupils are equal, round, and reactive to light.  Neck: Normal range of motion. Neck supple. Carotid bruit is not present. No thyromegaly present.  Cardiovascular: Normal rate, regular rhythm, normal heart sounds and intact distal pulses.  Exam reveals no gallop and no friction rub.   No murmur heard. Pulmonary/Chest: Effort normal and breath sounds normal. He has no wheezes. He has no rales.  Musculoskeletal: Normal range of motion.  Lymphadenopathy:    He has no cervical adenopathy.  Neurological: He is alert and  oriented to person, place, and time. No cranial nerve deficit.  Skin: Skin is warm and dry. No rash noted. He is not diaphoretic.  Psychiatric: He has a normal mood and affect. His behavior is normal.   Results for orders placed in visit on 10/15/13  CBC WITH DIFFERENTIAL      Result Value Ref Range   WBC 5.9  4.0 - 10.5 K/uL   RBC 4.86  4.22 - 5.81 MIL/uL   Hemoglobin 14.8  13.0 - 17.0 g/dL   HCT 41.7  39.0 - 52.0 %   MCV 85.8  78.0 - 100.0 fL   MCH 30.5  26.0 - 34.0 pg  MCHC 35.5  30.0 - 36.0 g/dL   RDW 15.5  11.5 - 15.5 %   Platelets 117 (*) 150 - 400 K/uL   Neutrophils Relative % 57  43 - 77 %   Neutro Abs 3.4  1.7 - 7.7 K/uL   Lymphocytes Relative 35  12 - 46 %   Lymphs Abs 2.1  0.7 - 4.0 K/uL   Monocytes Relative 7  3 - 12 %   Monocytes Absolute 0.4  0.1 - 1.0 K/uL   Eosinophils Relative 0  0 - 5 %   Eosinophils Absolute 0.0  0.0 - 0.7 K/uL   Basophils Relative 1  0 - 1 %   Basophils Absolute 0.1  0.0 - 0.1 K/uL   Smear Review Criteria for review not met         Assessment & Plan:   1. Acute recurrent maxillary sinusitis   2. Allergic rhinitis, unspecified allergic rhinitis type   3. CML (chronic myelocytic leukemia)    1.  Acute persistent maxillary sinusitis:  Slightly improved but persistent; s/p Amoxicillin and Omnicef.  Obtain CT sinuses; rx for Doxycycline provided while awaiting CT results. Continue Astelin, Allegra.  ADD nasal saline spray bid. Interaction with quinolone and Tasigna.  May warrant ENT and allergy consultations. 2. Allergic Rhinitis:  Improved but uncontrolled; continue Allegra and Astelin.   3.  CML: stable; if symptoms persists will contact oncology to see if agreeable with steroid taper.  Meds ordered this encounter  Medications  . doxycycline (VIBRAMYCIN) 100 MG capsule    Sig: Take 1 capsule (100 mg total) by mouth 2 (two) times daily.    Dispense:  30 capsule    Refill:  0    No Follow-up on file.   I personally performed the  services described in this documentation, which was scribed in my presence. The recorded information has been reviewed and is accurate.  Reginia Forts, M.D.  [facility] [address] [place], [place]  [address] [phone] phone [phone] fax

## 2013-11-04 ENCOUNTER — Encounter: Payer: Self-pay | Admitting: Family Medicine

## 2013-11-06 ENCOUNTER — Ambulatory Visit
Admission: RE | Admit: 2013-11-06 | Discharge: 2013-11-06 | Disposition: A | Discharge status: Home / Self Care | Payer: BC Managed Care – PPO | Source: Ambulatory Visit | Attending: Family Medicine | Admitting: Family Medicine

## 2013-11-06 DIAGNOSIS — J0101 Acute recurrent maxillary sinusitis: Secondary | ICD-10-CM

## 2013-11-18 ENCOUNTER — Encounter (HOSPITAL_COMMUNITY): Payer: Self-pay | Admitting: Emergency Medicine

## 2013-11-18 ENCOUNTER — Emergency Department (HOSPITAL_COMMUNITY)
Admission: EM | Admit: 2013-11-18 | Discharge: 2013-11-18 | Disposition: A | Discharge status: Home / Self Care | Payer: BC Managed Care – PPO | Attending: Emergency Medicine | Admitting: Emergency Medicine

## 2013-11-18 DIAGNOSIS — C9211 Chronic myeloid leukemia, BCR/ABL-positive, in remission: Secondary | ICD-10-CM | POA: Insufficient documentation

## 2013-11-18 DIAGNOSIS — R6889 Other general symptoms and signs: Secondary | ICD-10-CM | POA: Insufficient documentation

## 2013-11-18 DIAGNOSIS — Z792 Long term (current) use of antibiotics: Secondary | ICD-10-CM | POA: Insufficient documentation

## 2013-11-18 DIAGNOSIS — Z79899 Other long term (current) drug therapy: Secondary | ICD-10-CM | POA: Insufficient documentation

## 2013-11-18 DIAGNOSIS — Z791 Long term (current) use of non-steroidal anti-inflammatories (NSAID): Secondary | ICD-10-CM | POA: Insufficient documentation

## 2013-11-18 DIAGNOSIS — J45909 Unspecified asthma, uncomplicated: Secondary | ICD-10-CM | POA: Insufficient documentation

## 2013-11-18 DIAGNOSIS — H748X9 Other specified disorders of middle ear and mastoid, unspecified ear: Secondary | ICD-10-CM | POA: Insufficient documentation

## 2013-11-18 DIAGNOSIS — Z8679 Personal history of other diseases of the circulatory system: Secondary | ICD-10-CM | POA: Insufficient documentation

## 2013-11-18 DIAGNOSIS — R040 Epistaxis: Secondary | ICD-10-CM

## 2013-11-18 DIAGNOSIS — J3489 Other specified disorders of nose and nasal sinuses: Secondary | ICD-10-CM | POA: Insufficient documentation

## 2013-11-18 MED ORDER — TRAMADOL HCL 50 MG PO TABS
50.0000 mg | ORAL_TABLET | Freq: Once | ORAL | Status: AC
  Administered 2013-11-18: 50 mg via ORAL
  Filled 2013-11-18: qty 1

## 2013-11-18 MED ORDER — TRAMADOL HCL 50 MG PO TABS: 50.0000 mg | ORAL_TABLET | Freq: Four times a day (QID) | ORAL | Status: DC | PRN

## 2013-11-18 NOTE — ED Notes (Signed)
Pt complains of a nosebleed lasting almost 2 hours now. He's had a sinus infection for several months.

## 2013-11-18 NOTE — Discharge Instructions (Signed)
Call Dr. Wilburn Cornelia, or another ENT specialist for further management of your persistent nose bleeds and sinus pressure. Call for a follow up appointment with a Family or Primary Care Provider.  Return if Symptoms worsen.   Take medication as prescribed.  Avoid blowing your nose to allow the clot to stabalize. Stop taking Asprin as previously prescribed.

## 2013-11-18 NOTE — ED Provider Notes (Signed)
CSN: [id number]     Arrival date & time 11/18/13  0106 History   First MD Initiated Contact with Patient 11/18/13 0133     Chief Complaint  Patient presents with  . Epistaxis     (Consider location/radiation/quality/duration/timing/severity/associated sxs/prior Treatment) HPI Comments: The patient is a 28 year old male with past medical history of CML, presenting emergency room chief complaint of epistasis since 2 hours ago after sneezing. The patient reports multiple episodes of nosebleeds over the past several weeks. He reports he has been treated by his PCP with amoxicillin, Cefdinir, currently on doxycycline for sinusitis. He reports frontal pressure. Has tried Flonase and azelastine in the past. Reports afrin increases bleeding. PCP: Reginia Forts  Patient is a 28 y.o. male presenting with nosebleeds. The history is provided by the patient. No language interpreter was used.  Epistaxis Associated symptoms: congestion and sneezing   Associated symptoms: no fever and no sore throat     Past Medical History  Diagnosis Date  . Chronic myeloid leukemia in remission   . CML (chronic myelocytic leukemia)   . Allergy   . Asthma   . Migraine    Past Surgical History  Procedure Laterality Date  . Appendectomy     Family History  Problem Relation Age of Onset  . Sleep apnea Mother   . Migraines Father    History  Substance Use Topics  . Smoking status: Never Smoker   . Smokeless tobacco: Not on file  . Alcohol Use: Yes     Comment: occasional    Review of Systems  Constitutional: Negative for fever and chills.  HENT: Positive for congestion, nosebleeds, rhinorrhea, sinus pressure and sneezing. Negative for sore throat and trouble swallowing.   Gastrointestinal: Negative for nausea, vomiting and abdominal pain.      Allergies  Review of patient's allergies indicates no known allergies.  Home Medications   Prior to Admission medications   Medication Sig Start Date End  Date Taking? Authorizing Provider  aspirin 325 MG tablet Take 650 mg by mouth every 4 (four) hours as needed for headache.   Yes Historical Provider, MD  azelastine (ASTELIN) 0.1 % nasal spray Place 2 sprays into both nostrils 2 (two) times daily. Use in each nostril as directed 10/15/13  Yes Wardell Honour, MD  doxycycline (VIBRAMYCIN) 100 MG capsule Take 1 capsule (100 mg total) by mouth 2 (two) times daily. 11/03/13  Yes Wardell Honour, MD  meloxicam (MOBIC) 15 MG tablet Take 1 tablet (15 mg total) by mouth daily. 10/15/13  Yes Wardell Honour, MD  nilotinib (TASIGNA) 150 MG capsule Take 2 capsules (300 mg total) by mouth every 12 (twelve) hours. 11/20/12  Yes Wyatt Portela, MD   BP 138/80  Pulse 93  Temp(Src) 97.3 F (36.3 C) (Oral)  Resp 16  SpO2 98% Physical Exam  Nursing note and vitals reviewed. Constitutional: He appears well-developed and well-nourished.  Non-toxic appearance. He does not have a sickly appearance. He does not appear ill. No distress.  HENT:  Head: Normocephalic and atraumatic.  Right Ear: External ear normal. Tympanic membrane is erythematous. Tympanic membrane is not retracted and not bulging. A middle ear effusion is present.  Left Ear: External ear normal.  Nose: Mucosal edema present. No nasal deformity. Epistaxis is observed.  Mouth/Throat: Uvula is midline. Mucous membranes are not dry. No oropharyngeal exudate.  Bilateral turbinate edema,completely occluding nare.  Minimal bright red blood in vestibule, no obvious bleeding lesion identified.  Minimal blood in  posterior oropharynx.  Left canal completely occluded to cerumen.  Neck: Neck supple.  Cardiovascular: Normal rate and regular rhythm.   Pulmonary/Chest: Effort normal and breath sounds normal. He has no wheezes. He has no rales.  Lymphadenopathy:       Head (right side): No submental, no submandibular, no tonsillar, no preauricular, no posterior auricular and no occipital adenopathy present.       Head  (left side): No submental, no submandibular, no tonsillar, no preauricular, no posterior auricular and no occipital adenopathy present.    He has no cervical adenopathy.  Neurological: He is alert.  Skin: Skin is warm and dry. No rash noted. He is not diaphoretic.  Psychiatric: He has a normal mood and affect. His behavior is normal.    ED Course  Procedures (including critical care time) Labs Review Labs Reviewed - No data to display  Imaging Review No results found.   EKG Interpretation None      MDM   Final diagnoses:  Epistaxis   She presents with epistasis. Bleeding controlled on exam.  Pt reports ongoing epistaxis for 3 months. Referral to ENT for further management. Discussed treatment plan with the patient. Return precautions given. Reports understanding and no other concerns at this time.  Patient is stable for discharge at this time.   Meds given in ED:  Medications  traMADol (ULTRAM) tablet 50 mg (50 mg Oral Given 11/18/13 0245)    New Prescriptions   TRAMADOL (ULTRAM) 50 MG TABLET    Take 1 tablet (50 mg total) by mouth every 6 (six) hours as needed.        Lorrine Kin, PA-C 11/20/13 1324

## 2013-11-18 NOTE — ED Notes (Signed)
No bleeding at this time. 

## 2013-11-19 ENCOUNTER — Other Ambulatory Visit: Payer: Self-pay | Admitting: Family Medicine

## 2013-11-19 DIAGNOSIS — J309 Allergic rhinitis, unspecified: Secondary | ICD-10-CM

## 2013-11-21 ENCOUNTER — Telehealth: Payer: Self-pay

## 2013-11-21 NOTE — Telephone Encounter (Signed)
Pt called and said to let Maryellen Pile know that the medication Lanell Persons wanted to prescribe him will not interfere with that he is already taken, and to go ahead and prescribe them

## 2013-11-21 NOTE — Telephone Encounter (Signed)
Pt states that Dr. Tamala Julian wanted to put patient on prednisone. He is calling to let us know to please send in prednisone for the swelling.

## 2013-11-22 MED ORDER — PREDNISONE 20 MG PO TABS: ORAL_TABLET | ORAL | Status: DC

## 2013-11-22 NOTE — Telephone Encounter (Signed)
Please call patient -- Prednisone rx sent to [facility].

## 2013-11-22 NOTE — Telephone Encounter (Signed)
Pt.notified

## 2013-11-27 NOTE — ED Provider Notes (Signed)
Medical screening examination/treatment/procedure(s) were performed by non-physician practitioner and as supervising physician I was immediately available for consultation/collaboration.   EKG Interpretation None        Julianne Rice, MD 11/27/13 [phone]

## 2013-12-07 ENCOUNTER — Other Ambulatory Visit: Payer: Self-pay | Admitting: Family Medicine

## 2013-12-08 ENCOUNTER — Telehealth: Payer: Self-pay | Admitting: Family Medicine

## 2013-12-08 NOTE — Telephone Encounter (Signed)
Patient's wife states that her husband needs a refill on Predisone. States that she spoke to Dr. Tamala Julian earlier today and states that Dr. Tamala Julian was going to call in enough Predisone for patient until he is able to go to his ENT doctor.  [facility]  Please call Carlyon Shadow patient's wife. [phone].

## 2013-12-09 ENCOUNTER — Other Ambulatory Visit: Payer: Self-pay | Admitting: Otolaryngology

## 2013-12-09 MED ORDER — PREDNISONE 20 MG PO TABS: ORAL_TABLET | ORAL | Status: DC

## 2013-12-09 NOTE — Telephone Encounter (Signed)
Pt was given a taper dose of prednisone on 7/18. Wife states pt was suppose to have a refill sent to the pharmacy. Is this ok to send or RTC?

## 2013-12-09 NOTE — Telephone Encounter (Signed)
Call --- refill of Prednisone sent to pharmacy.

## 2013-12-09 NOTE — Telephone Encounter (Signed)
Lm for pt- script sent to the pharmacy.

## 2013-12-24 ENCOUNTER — Other Ambulatory Visit: Payer: BC Managed Care – PPO

## 2013-12-24 ENCOUNTER — Ambulatory Visit: Payer: BC Managed Care – PPO | Admitting: Oncology

## 2014-01-07 ENCOUNTER — Encounter: Payer: Self-pay | Admitting: *Deleted

## 2014-01-07 ENCOUNTER — Other Ambulatory Visit: Payer: Self-pay | Admitting: *Deleted

## 2014-01-07 DIAGNOSIS — C9210 Chronic myeloid leukemia, BCR/ABL-positive, not having achieved remission: Secondary | ICD-10-CM

## 2014-01-07 MED ORDER — NILOTINIB HCL 150 MG PO CAPS: 300.0000 mg | ORAL_CAPSULE | Freq: Two times a day (BID) | ORAL | Status: DC

## 2014-01-07 NOTE — Progress Notes (Signed)
Faxed new script for tsigna to [facility], spoke with patient, let him know schedulers will be calling to r/s missed lab and dr visit from 12/23/13 to first available.

## 2014-01-09 ENCOUNTER — Telehealth: Payer: Self-pay | Admitting: Oncology

## 2014-01-09 NOTE — Telephone Encounter (Signed)
Call pt to confirm updated sch no vm set up, will c/b later, also will mail updated sch per 09/02 POF labs/ov r/s .[name]..KJ

## 2014-01-13 ENCOUNTER — Other Ambulatory Visit: Payer: BC Managed Care – PPO

## 2014-01-13 ENCOUNTER — Ambulatory Visit: Payer: BC Managed Care – PPO | Admitting: Oncology

## 2014-01-14 ENCOUNTER — Ambulatory Visit: Payer: BC Managed Care – PPO | Admitting: Family Medicine

## 2014-01-15 ENCOUNTER — Telehealth: Payer: Self-pay | Admitting: Oncology

## 2014-01-15 NOTE — Telephone Encounter (Signed)
Pt called states he missed his visit r/s & confirmed w/pt...KJ

## 2014-01-26 ENCOUNTER — Other Ambulatory Visit: Payer: Self-pay | Admitting: *Deleted

## 2014-01-26 ENCOUNTER — Telehealth: Payer: Self-pay | Admitting: Oncology

## 2014-01-26 DIAGNOSIS — C9210 Chronic myeloid leukemia, BCR/ABL-positive, not having achieved remission: Secondary | ICD-10-CM

## 2014-01-26 NOTE — Telephone Encounter (Signed)
Per MD, pt r/s for 10/09 w/KC, pt has not set up vm, mailed out updated sch...KJ

## 2014-01-26 NOTE — Telephone Encounter (Signed)
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

## 2014-01-27 MED ORDER — NILOTINIB HCL 150 MG PO CAPS: 300.0000 mg | ORAL_CAPSULE | Freq: Two times a day (BID) | ORAL | Status: DC

## 2014-01-27 NOTE — Addendum Note (Signed)
Addended by: Wyonia Hough on: 01/27/2014 04:54 PM   Modules accepted: Orders

## 2014-02-02 ENCOUNTER — Other Ambulatory Visit: Payer: BC Managed Care – PPO

## 2014-02-02 ENCOUNTER — Ambulatory Visit: Payer: BC Managed Care – PPO | Admitting: Oncology

## 2014-02-13 ENCOUNTER — Ambulatory Visit: Payer: BC Managed Care – PPO | Admitting: Oncology

## 2014-02-13 ENCOUNTER — Other Ambulatory Visit: Payer: BC Managed Care – PPO

## 2014-02-20 ENCOUNTER — Telehealth: Payer: Self-pay | Admitting: Oncology

## 2014-02-20 ENCOUNTER — Other Ambulatory Visit: Payer: Self-pay | Admitting: Oncology

## 2014-02-20 NOTE — Telephone Encounter (Signed)
no vm available....amiled pt appt sched for NOV.Nathan KitchenMarland KitchenMarland Kerr

## 2014-03-20 ENCOUNTER — Telehealth: Payer: Self-pay | Admitting: Oncology

## 2014-03-20 ENCOUNTER — Ambulatory Visit (HOSPITAL_BASED_OUTPATIENT_CLINIC_OR_DEPARTMENT_OTHER): Payer: BC Managed Care – PPO | Admitting: Oncology

## 2014-03-20 ENCOUNTER — Encounter: Payer: Self-pay | Admitting: Oncology

## 2014-03-20 ENCOUNTER — Other Ambulatory Visit (HOSPITAL_BASED_OUTPATIENT_CLINIC_OR_DEPARTMENT_OTHER): Payer: BC Managed Care – PPO

## 2014-03-20 VITALS — BP 131/83 | HR 73 | Temp 98.6°F | Resp 18 | Ht 69.5 in | Wt 210.0 lb

## 2014-03-20 DIAGNOSIS — D696 Thrombocytopenia, unspecified: Secondary | ICD-10-CM

## 2014-03-20 DIAGNOSIS — C9210 Chronic myeloid leukemia, BCR/ABL-positive, not having achieved remission: Secondary | ICD-10-CM

## 2014-03-20 LAB — COMPREHENSIVE METABOLIC PANEL (CC13)
ALK PHOS: 97 U/L (ref 40–150)
ALT: 66 U/L — AB (ref 0–55)
AST: 24 U/L (ref 5–34)
Albumin: 4.1 g/dL (ref 3.5–5.0)
Anion Gap: 7 mEq/L (ref 3–11)
BUN: 11.8 mg/dL (ref 7.0–26.0)
CO2: 24 meq/L (ref 22–29)
Calcium: 9.6 mg/dL (ref 8.4–10.4)
Chloride: 107 mEq/L (ref 98–109)
Creatinine: 0.9 mg/dL (ref 0.7–1.3)
GLUCOSE: 103 mg/dL (ref 70–140)
Potassium: 3.8 mEq/L (ref 3.5–5.1)
SODIUM: 139 meq/L (ref 136–145)
TOTAL PROTEIN: 7.6 g/dL (ref 6.4–8.3)
Total Bilirubin: 1.19 mg/dL (ref 0.20–1.20)

## 2014-03-20 LAB — CBC WITH DIFFERENTIAL/PLATELET
BASO%: 0.9 % (ref 0.0–2.0)
Basophils Absolute: 0.1 10*3/uL (ref 0.0–0.1)
EOS ABS: 0 10*3/uL (ref 0.0–0.5)
EOS%: 0.8 % (ref 0.0–7.0)
HCT: 41.3 % (ref 38.4–49.9)
HGB: 13.7 g/dL (ref 13.0–17.1)
LYMPH%: 32 % (ref 14.0–49.0)
MCH: 29.5 pg (ref 27.2–33.4)
MCHC: 33.2 g/dL (ref 32.0–36.0)
MCV: 88.7 fL (ref 79.3–98.0)
MONO#: 0.4 10*3/uL (ref 0.1–0.9)
MONO%: 6.2 % (ref 0.0–14.0)
NEUT%: 60.1 % (ref 39.0–75.0)
NEUTROS ABS: 3.5 10*3/uL (ref 1.5–6.5)
Platelets: 142 10*3/uL (ref 140–400)
RBC: 4.66 10*6/uL (ref 4.20–5.82)
RDW: 15.4 % — ABNORMAL HIGH (ref 11.0–14.6)
WBC: 5.8 10*3/uL (ref 4.0–10.3)
lymph#: 1.9 10*3/uL (ref 0.9–3.3)

## 2014-03-20 NOTE — Telephone Encounter (Signed)
Pt confirmed labs/ov per 11/13 POF, gave pt AVS..... KJ °

## 2014-03-20 NOTE — Progress Notes (Signed)
Hematology and Oncology Follow Up Visit  Nathan Kerr [id number] 1985/12/14 28 y.o. 03/20/2014 1:27 PM   CC: [name], M.D.    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   Prior Therapy:  1. Patient treated with Gleevec 400 mg daily between April of 2010 until September of 2010. 2.     Patient treated with Gleevec 200 mg daily due to severe thrombocytopenia and subsequently switched to Tasigna for lack of efficacy and poor tolerance.   Current therapy: He is on Tasigna 300 mg tablet b.i.d. total 600 mg daily, started in August of 2011.  He had a complete hematological remission and near molecular remission at this time. He presented with a relapse in November of 2014 with elevated white cell count close to 300,000 range after he stopped taking his medication due to poor compliance. He was restarted in November of 2014 and continues to do well since.  Interim History: Nathan Kerr presents today for a followup visit.  He is a very pleasant gentleman with chronic phase CML. In the last few months, he has been taking his Tasigna BID on most days. Due to his inconsistent work schedule, he sometimes forget to take a second dose of Tasigna every day.  He had felt perfectly normal at this time. He is no longer reporting any headaches or abdominal pain. Has not reported any early satiety or constitutional symptoms. He continues to work full-time and is not reporting any complication of this medication. Is not reporting any edema or abdominal distention. Has not reported any shortness of breath or cough. Has not reported any hemoptysis or hematemesis. Has not reported easy bruisability or bleeding.  Medications: I have reviewed the patient's current medications. Current Outpatient Prescriptions  Medication Sig Dispense Refill  . meloxicam (MOBIC) 15 MG tablet Take 1 tablet (15 mg total) by mouth daily. 30 tablet 5   . nilotinib (TASIGNA) 150 MG capsule Take 2 capsules (300 mg total) by mouth every 12 (twelve) hours. 120 capsule 2  . SUMAtriptan (IMITREX) 100 MG tablet   4  . azelastine (ASTELIN) 0.1 % nasal spray Place 2 sprays into both nostrils 2 (two) times daily. Use in each nostril as directed 30 mL 12  . doxycycline (VIBRAMYCIN) 100 MG capsule Take 1 capsule (100 mg total) by mouth 2 (two) times daily. 30 capsule 0  . predniSONE (DELTASONE) 20 MG tablet two tablets daily x 3 days then one tablet daily x 1 day 7 tablet 0  . traMADol (ULTRAM) 50 MG tablet Take 1 tablet (50 mg total) by mouth every 6 (six) hours as needed. 20 tablet 0   Current Facility-Administered Medications  Medication Dose Route Frequency Provider Last Rate Last Dose  . ondansetron (ZOFRAN-ODT) disintegrating tablet 8 mg  8 mg Oral Once Leandrew Koyanagi, MD        Allergies: No Known Allergies  Past Medical History, Surgical history, Social history, and Family History were reviewed and updated.  Review of Systems:  Remaining ROS negative.  Physical Exam: Blood pressure 131/83, pulse 73, temperature 98.6 F (37 C), temperature source Oral, resp. rate 18, height 5' 9.5" (1.765 m), weight 210 lb (95.255 kg), SpO2 100 %. ECOG: 0 General appearance: alert awake not in any distress. Head: Normocephalic, without obvious abnormality, atraumatic Neck: no adenopathy, no carotid bruit, no JVD, supple, symmetrical, trachea midline and thyroid not enlarged, symmetric, no tenderness/mass/nodules Lymph nodes: Cervical, supraclavicular, and  axillary nodes normal. Heart:regular rate and rhythm, S1, S2 normal, no murmur, click, rub or gallop Lung:chest clear, no wheezing, rales, normal symmetric air entry Abdomen: Soft and nontender. Could not appreciate any splenomegaly. EXT:no erythema, induration, or nodules Neurological exam: No deficits retinal hemorrhages noted on examination  Lab Results: Lab Results  Component Value Date    WBC 5.8 03/20/2014   HGB 13.7 03/20/2014   HCT 41.3 03/20/2014   MCV 88.7 03/20/2014   PLT 142 03/20/2014     Chemistry      Component Value Date/Time   NA 139 03/20/2014 1021   NA 142 03/11/2013 1321   K 3.8 03/20/2014 1021   K 3.2* 03/11/2013 1321   CL 104 03/11/2013 1321   CO2 24 03/20/2014 1021   CO2 28 03/11/2013 1321   BUN 11.8 03/20/2014 1021   BUN 16 03/11/2013 1321   CREATININE 0.9 03/20/2014 1021   CREATININE 1.02 03/11/2013 1321   CREATININE 0.96 12/27/2011 1019      Component Value Date/Time   CALCIUM 9.6 03/20/2014 1021   CALCIUM 9.6 03/11/2013 1321   ALKPHOS 97 03/20/2014 1021   ALKPHOS 66 03/11/2013 1321   AST 24 03/20/2014 1021   AST 23 03/11/2013 1321   ALT 66* 03/20/2014 1021   ALT 16 03/11/2013 1321   BILITOT 1.19 03/20/2014 1021   BILITOT 1.4* 03/11/2013 1321      Impression and Plan:  28 year old gentleman with the following issues: 1. Chronic phase chronic myelogenous leukemia.  His white cell count has perfectly normalized at this time after adhereing to the medication regimen. I see no evidence to suggest transformation to an accelerated phase at this point. The plan is to continue him on the same dose and schedule.  2. Cytopenia. Resolved.  3. Follow-up. Will be in 4 months for now.   Casidee Jann 11/13/20151:27 PM

## 2014-05-29 ENCOUNTER — Telehealth: Payer: Self-pay | Admitting: Family Medicine

## 2014-05-29 NOTE — Telephone Encounter (Signed)
Opened in error when reviewing some old notes. 

## 2014-06-05 ENCOUNTER — Other Ambulatory Visit: Payer: Self-pay | Admitting: Family Medicine

## 2014-07-21 ENCOUNTER — Other Ambulatory Visit (HOSPITAL_BASED_OUTPATIENT_CLINIC_OR_DEPARTMENT_OTHER): Payer: BLUE CROSS/BLUE SHIELD

## 2014-07-21 ENCOUNTER — Other Ambulatory Visit: Payer: Self-pay | Admitting: *Deleted

## 2014-07-21 ENCOUNTER — Telehealth: Payer: Self-pay | Admitting: Oncology

## 2014-07-21 ENCOUNTER — Ambulatory Visit (HOSPITAL_BASED_OUTPATIENT_CLINIC_OR_DEPARTMENT_OTHER): Payer: BLUE CROSS/BLUE SHIELD | Admitting: Oncology

## 2014-07-21 VITALS — BP 124/72 | HR 86 | Temp 98.2°F | Resp 18 | Ht 69.5 in | Wt 216.2 lb

## 2014-07-21 DIAGNOSIS — C9210 Chronic myeloid leukemia, BCR/ABL-positive, not having achieved remission: Secondary | ICD-10-CM

## 2014-07-21 DIAGNOSIS — C9290 Myeloid leukemia, unspecified, not having achieved remission: Secondary | ICD-10-CM

## 2014-07-21 LAB — COMPREHENSIVE METABOLIC PANEL (CC13)
ALT: 49 U/L (ref 0–55)
AST: 16 U/L (ref 5–34)
Albumin: 3.9 g/dL (ref 3.5–5.0)
Alkaline Phosphatase: 77 U/L (ref 40–150)
Anion Gap: 13 mEq/L — ABNORMAL HIGH (ref 3–11)
BUN: 17.8 mg/dL (ref 7.0–26.0)
CALCIUM: 9.5 mg/dL (ref 8.4–10.4)
CO2: 23 mEq/L (ref 22–29)
Chloride: 105 mEq/L (ref 98–109)
Creatinine: 1 mg/dL (ref 0.7–1.3)
EGFR: >90 mL/min/{1.73_m2} (ref >90)
Glucose: 121 mg/dl (ref 70–140)
POTASSIUM: 4.4 meq/L (ref 3.5–5.1)
SODIUM: 141 meq/L (ref 136–145)
Total Bilirubin: 1.47 mg/dL — ABNORMAL HIGH (ref 0.20–1.20)
Total Protein: 7.3 g/dL (ref 6.4–8.3)

## 2014-07-21 LAB — CBC WITH DIFFERENTIAL/PLATELET
BASO%: 0.7 % (ref 0.0–2.0)
BASOS ABS: 0.1 10*3/uL (ref 0.0–0.1)
EOS ABS: 0.1 10*3/uL (ref 0.0–0.5)
EOS%: 0.9 % (ref 0.0–7.0)
HEMATOCRIT: 43.7 % (ref 38.4–49.9)
HGB: 14.6 g/dL (ref 13.0–17.1)
LYMPH#: 2 10*3/uL (ref 0.9–3.3)
LYMPH%: 24.3 % (ref 14.0–49.0)
MCH: 27.8 pg (ref 27.2–33.4)
MCHC: 33.4 g/dL (ref 32.0–36.0)
MCV: 83.1 fL (ref 79.3–98.0)
MONO#: 0.4 10*3/uL (ref 0.1–0.9)
MONO%: 4.8 % (ref 0.0–14.0)
NEUT#: 5.7 10*3/uL (ref 1.5–6.5)
NEUT%: 69.3 % (ref 39.0–75.0)
Platelets: 188 10*3/uL (ref 140–400)
RBC: 5.26 10*6/uL (ref 4.20–5.82)
RDW: 15.3 % — ABNORMAL HIGH (ref 11.0–14.6)
WBC: 8.2 10*3/uL (ref 4.0–10.3)

## 2014-07-21 MED ORDER — NILOTINIB HCL 150 MG PO CAPS: 300.0000 mg | ORAL_CAPSULE | Freq: Two times a day (BID) | ORAL | Status: DC

## 2014-07-21 NOTE — Progress Notes (Signed)
Hematology and Oncology Follow Up Visit  Nathan Kerr [id number] 12-17-1985 29 y.o. 07/21/2014 10:43 AM   CC: [name], M.D.    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   Prior Therapy:  1. Patient treated with Gleevec 400 mg daily between April of 2010 until September of 2010. 2.     Patient treated with Gleevec 200 mg daily due to severe thrombocytopenia and subsequently switched to Tasigna for lack of efficacy and poor tolerance.   Current therapy: He is on Tasigna 300 mg tablet b.i.d. total 600 mg daily, started in August of 2011.  He had a complete hematological remission and near molecular remission at this time. He presented with a relapse in November of 2014 with elevated white cell count close to 300,000 range after he stopped taking his medication due to poor compliance. He was restarted in November of 2014 and continues to do well since.  Interim History: Nathan Kerr presents today for a followup visit with his wife. Since the last visit, he reports no complaints. He has been taking his Tasigna BID without any new complications. His adherence is much better at this time.  He had felt perfectly normal at this time. He is no longer reporting any headaches or abdominal pain. Has not reported any early satiety or constitutional symptoms. He continues to work full-time and is not reporting any complication of this medication. Is not reporting any edema or abdominal distention. Has not reported any shortness of breath or cough. Has not reported any hemoptysis or hematemesis. Has not reported easy bruisability or bleeding. He does not report any lymphadenopathy or petechiae. He does report occasional nonproductive cough. Rest of his review of systems unremarkable.  Medications: I have reviewed the patient's current medications. Current Outpatient Prescriptions  Medication Sig Dispense Refill  .  meloxicam (MOBIC) 15 MG tablet Take 1 tablet (15 mg total) by mouth daily. 30 tablet 5  . nilotinib (TASIGNA) 150 MG capsule Take 2 capsules (300 mg total) by mouth every 12 (twelve) hours. 120 capsule 2  . SUMAtriptan (IMITREX) 100 MG tablet Take 1 tablet at onset of headache. May repeat in 2 hrs if needed, MAX 2 tabs/24 hrs.PATIENT NEEDS OFFICE VISIT FOR ADDITIONAL REFILLS 9 tablet 0   Current Facility-Administered Medications  Medication Dose Route Frequency Provider Last Rate Last Dose  . ondansetron (ZOFRAN-ODT) disintegrating tablet 8 mg  8 mg Oral Once Leandrew Koyanagi, MD        Allergies: No Known Allergies  Past Medical History, Surgical history, Social history, and Family History were reviewed and updated.  Review of Systems:  Remaining ROS negative.  Physical Exam: Blood pressure 124/72, pulse 86, temperature 98.2 F (36.8 C), temperature source Oral, resp. rate 18, height 5' 9.5" (1.765 m), weight 216 lb 3.2 oz (98.068 kg). ECOG: 0 General appearance: alert awake not in any distress. Head: Normocephalic, without obvious abnormality, atraumatic Neck: no adenopathy Lymph nodes: Cervical, supraclavicular, and axillary nodes normal. Heart:regular rate and rhythm, S1, S2 normal, no murmur, click, rub or gallop Lung:chest clear, no wheezing, rales, normal symmetric air entry Abdomen: Soft and nontender. Could not appreciate any splenomegaly. EXT:no erythema, induration, or nodules Neurological exam: No deficits retinal hemorrhages noted on examination  Lab Results: Lab Results  Component Value Date   WBC 8.2 07/21/2014   HGB 14.6 07/21/2014   HCT 43.7 07/21/2014   MCV 83.1 07/21/2014   PLT 188 07/21/2014  Chemistry      Component Value Date/Time   NA 139 03/20/2014 1021   NA 142 03/11/2013 1321   K 3.8 03/20/2014 1021   K 3.2* 03/11/2013 1321   CL 104 03/11/2013 1321   CO2 24 03/20/2014 1021   CO2 28 03/11/2013 1321   BUN 11.8 03/20/2014 1021   BUN 16  03/11/2013 1321   CREATININE 0.9 03/20/2014 1021   CREATININE 1.02 03/11/2013 1321   CREATININE 0.96 12/27/2011 1019      Component Value Date/Time   CALCIUM 9.6 03/20/2014 1021   CALCIUM 9.6 03/11/2013 1321   ALKPHOS 97 03/20/2014 1021   ALKPHOS 66 03/11/2013 1321   AST 24 03/20/2014 1021   AST 23 03/11/2013 1321   ALT 66* 03/20/2014 1021   ALT 16 03/11/2013 1321   BILITOT 1.19 03/20/2014 1021   BILITOT 1.4* 03/11/2013 1321      Impression and Plan:  29 year old gentleman with the following issues: 1. Chronic phase chronic myelogenous leukemia.  His white cell count has perfectly normalized at this time after adhereing to the medication regimen. I see no evidence to suggest transformation to an accelerated phase at this point. The plan is to continue him on the same dose and schedule. I will repeat his molecular testing with the next visit.  2. Cytopenia. Resolved at this time and there were likely related to side effects.  3. Follow-up. Will be in 6 months for now.   SHADAD,FIRAS 3/15/201610:43 AM

## 2014-07-21 NOTE — Telephone Encounter (Signed)
Pt confirmed labs/ov per 03/15 POF, gave pt AVS and calendar.... KJ °

## 2014-08-06 ENCOUNTER — Other Ambulatory Visit: Payer: Self-pay | Admitting: Family Medicine

## 2014-12-12 IMAGING — CT CT PARANASAL SINUSES LIMITED
1 of 2 series · 9 of 12 positions shown, 12 images · non-contrast
Comparison: None.

CLINICAL DATA: Chronic nasal congestion for 3 months with nasal
drainage, on antibiotics with history of chronic myelocytic leukemia
in remission

EXAM:
CT PARANASAL SINUS WITHOUT CONTRAST
TECHNIQUE: Multidetector CT images of the paranasal sinuses were obtained using
the standard protocol without intravenous contrast.

[Series 3: coronal soft · axial · 0.33mm/px · z∈[+40,+120]mm · 9 of 11 slices shown, 12 images]
[im 2/11  brain]
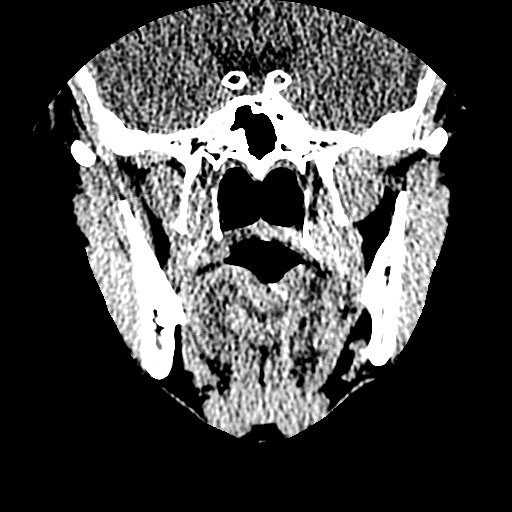
[im 2/11  bone]
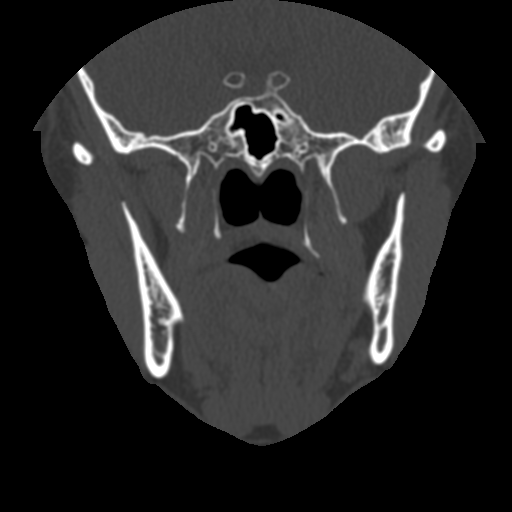
[im 3/11  bone]
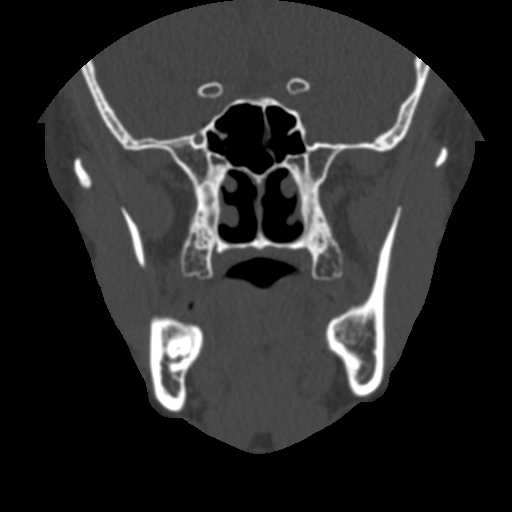
[im 4/11  bone]
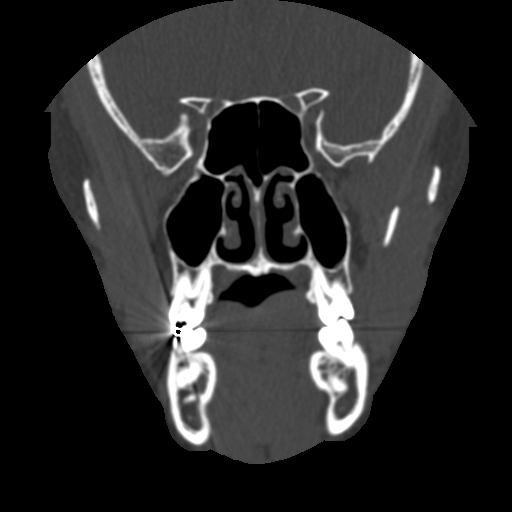
[im 5/11  bone]
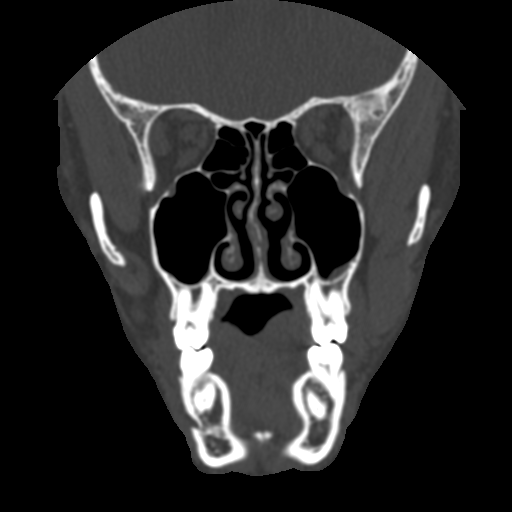
[im 6/11  brain]
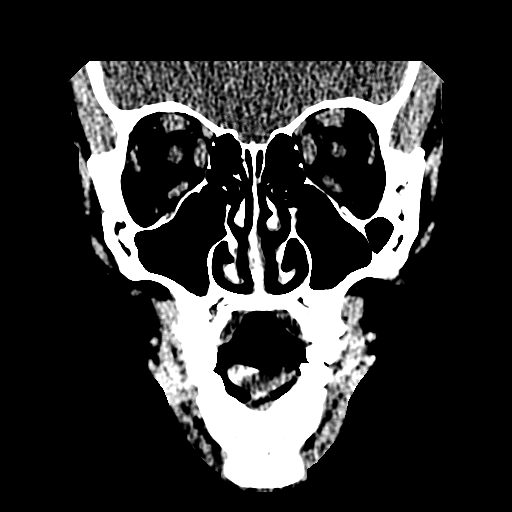
[im 6/11  bone]
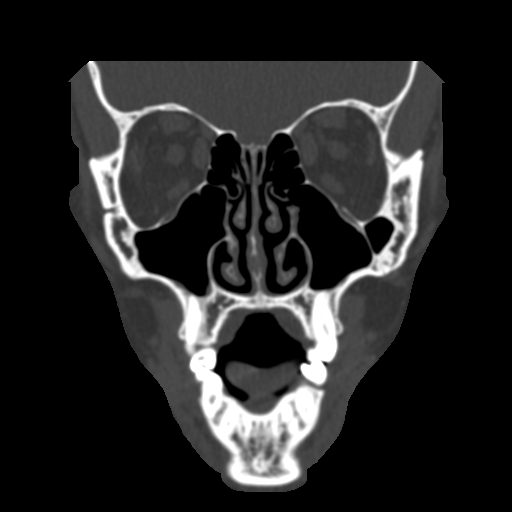
[im 7/11  bone]
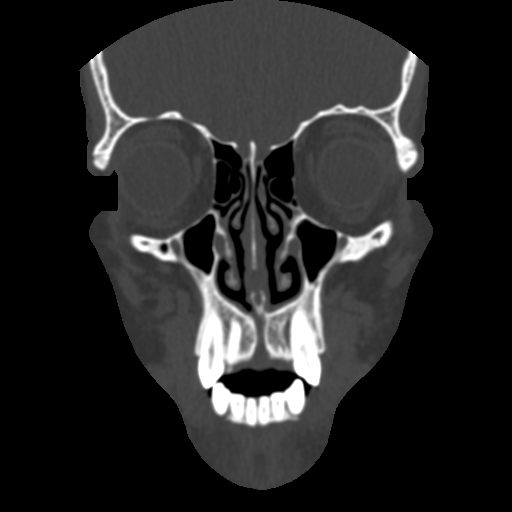
[im 8/11  bone]
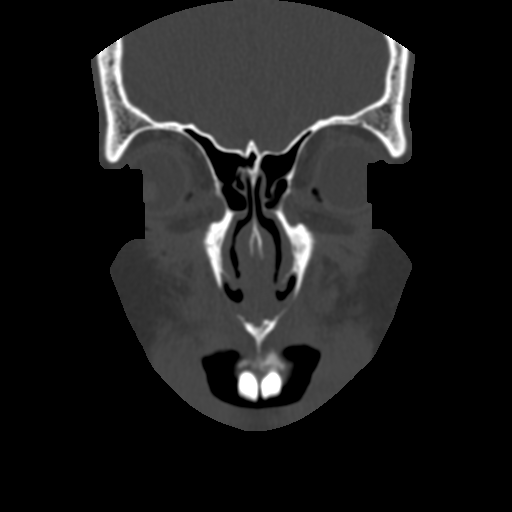
[im 9/11  bone]
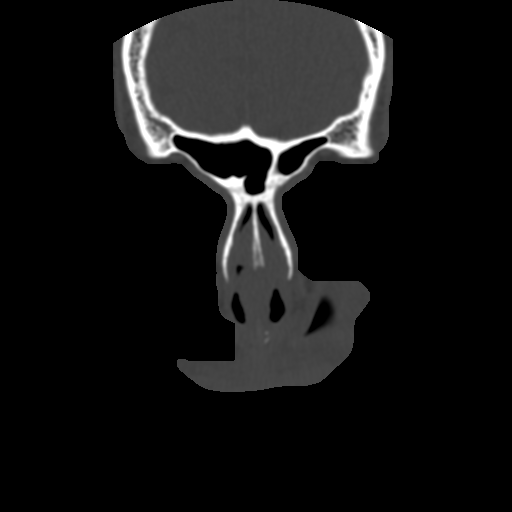
[im 10/11  brain]
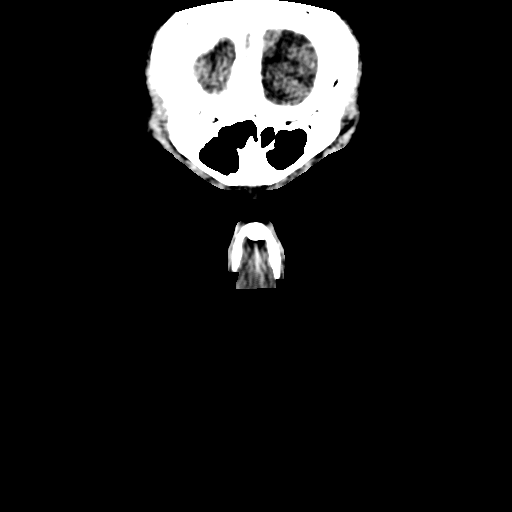
[im 10/11  bone]
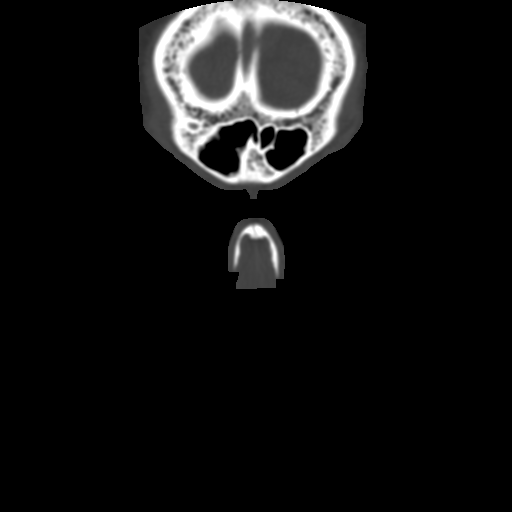

[9 of 12 positions shown; findings below may reference images not displayed]

FINDINGS: Frontal sinuses are clear. Minimal mucosal inflammatory change
involving posterior inferior left ethmoid air cell. Sphenoid sinuses
clear. Mild mucosal periosteal thickening along the floor of both
maxillary sinuses. Mild leftward deviation of the nasal septum. No
significant abnormality involving the turbinates.
IMPRESSION: Very mild inflammatory change in some of the paranasal sinuses.

## 2014-12-14 ENCOUNTER — Ambulatory Visit (INDEPENDENT_AMBULATORY_CARE_PROVIDER_SITE_OTHER): Payer: BLUE CROSS/BLUE SHIELD | Admitting: Family Medicine

## 2014-12-14 ENCOUNTER — Encounter: Payer: Self-pay | Admitting: Family Medicine

## 2014-12-14 VITALS — BP 126/86 | HR 66 | Temp 98.4°F | Resp 16 | Ht 70.0 in | Wt 210.4 lb

## 2014-12-14 DIAGNOSIS — C9290 Myeloid leukemia, unspecified, not having achieved remission: Secondary | ICD-10-CM

## 2014-12-14 DIAGNOSIS — J301 Allergic rhinitis due to pollen: Secondary | ICD-10-CM

## 2014-12-14 DIAGNOSIS — C9210 Chronic myeloid leukemia, BCR/ABL-positive, not having achieved remission: Secondary | ICD-10-CM

## 2014-12-14 DIAGNOSIS — G43009 Migraine without aura, not intractable, without status migrainosus: Secondary | ICD-10-CM

## 2014-12-14 MED ORDER — SUMATRIPTAN SUCCINATE 100 MG PO TABS: ORAL_TABLET | ORAL | Status: DC

## 2014-12-14 MED ORDER — MELOXICAM 15 MG PO TABS: 15.0000 mg | ORAL_TABLET | Freq: Every day | ORAL | Status: DC

## 2014-12-14 MED ORDER — OXYCODONE-ACETAMINOPHEN 10-325 MG PO TABS: 1.0000 | ORAL_TABLET | Freq: Four times a day (QID) | ORAL | Status: DC | PRN

## 2014-12-14 NOTE — Progress Notes (Signed)
Subjective:    Patient ID: Nathan Kerr, male    DOB: [birth year], 29 y.o.   MRN: [id number]  12/14/2014  Medication Refill   HPI This 29 y.o. male presents for one year follow-up for migraines.  Needing medication refills.  Intermittent.  Occur sporadically. Cluster migraines; will have none for months.  No rhinorrhea or eye drainage.  Light sensitivity; mild nausea; no vomiting.  No blurred vision or diplopia; mild blurred vision.  No dizziness.  No n/t/w.  Uses Mobic initially; tolerates well.  Second line is Imitrex; only takes 1/2 at work; does suffer with side effects.  If no improvement with Imitrex, then goes to   Percocet.   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  Allergic rhinitis: stable; no recent issues; denies nasal congestion, rhinorrhea, sneezing, itchy eyes.   Review of Systems  Constitutional: Negative for fever, chills, diaphoresis, activity change, appetite change and fatigue.  Eyes: Positive for photophobia.  Respiratory: Negative for cough and shortness of breath.   Cardiovascular: Negative for chest pain, palpitations and leg swelling.  Gastrointestinal: Positive for nausea. Negative for vomiting, abdominal pain and diarrhea.  Endocrine: Negative for cold intolerance, heat intolerance, polydipsia, polyphagia and polyuria.  Skin: Negative for color change, rash and wound.  Neurological: Positive for headaches. Negative for dizziness, tremors, seizures, syncope, facial asymmetry, speech difficulty, weakness, light-headedness and numbness.  Psychiatric/Behavioral: Negative for sleep disturbance and dysphoric mood. The patient is not nervous/anxious.     Past Medical History  Diagnosis Date  . Chronic myeloid leukemia in remission   . CML (chronic myelocytic leukemia)   . Allergy   . Asthma   . Migraine    Past Surgical History  Procedure Laterality Date  . Appendectomy     No  Known Allergies Current Outpatient Prescriptions  Medication Sig Dispense Refill  . meloxicam (MOBIC) 15 MG tablet Take 1 tablet (15 mg total) by mouth daily. PATIENT NEEDS OFFICE VISIT FOR ADDITIONAL REFILLS 30 tablet 5  . SUMAtriptan (IMITREX) 100 MG tablet Take 1 tablet at onset of headache. May repeat in 2 hrs if needed, MAX 2 tabs/24 hrs. 9 tablet 5  . nilotinib (TASIGNA) 150 MG capsule Take 2 capsules (300 mg total) by mouth every 12 (twelve) hours. (Patient not taking: Reported on 12/14/2014) 120 capsule 2  . oxyCODONE-acetaminophen (PERCOCET) 10-325 MG per tablet Take 1 tablet by mouth every 6 (six) hours as needed for pain. 30 tablet 0   Current Facility-Administered Medications  Medication Dose Route Frequency Provider Last Rate Last Dose  . ondansetron (ZOFRAN-ODT) disintegrating tablet 8 mg  8 mg Oral Once Leandrew Koyanagi, MD       Social History   Social History  . Marital Status: Married    Spouse Name: N/A  . Number of Children: 0  . Years of Education: N/A   Occupational History  .      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 Smoking status: Never Smoker   . Smokeless tobacco: Not on file  . Alcohol Use: Yes     Comment: occasional  . Drug Use: No  . Sexual Activity:    Partners: Female   Other Topics Concern  . Not on file   Social History Narrative   Family History  Problem Relation Age of Onset  . Sleep apnea Mother   . Migraines Father        Objective:  BP 126/86 mmHg  Pulse 66  Temp(Src) 98.4 F (36.9 C) (Oral)  Resp 16  Ht 5' 10"  (1.778 m)  Wt 210 lb 6.4 oz (95.437 kg)  BMI 30.19 kg/m2 Physical Exam  Constitutional: He is oriented to person, place, and time. He appears well-developed and well-nourished. No distress.  HENT:  Head: Normocephalic and atraumatic.  Right Ear: External ear normal.  Left Ear: External ear normal.  Nose: Nose normal.  Mouth/Throat: Oropharynx is clear and moist.  Eyes: Conjunctivae and EOM  are normal. Pupils are equal, round, and reactive to light.  Neck: Normal range of motion. Neck supple. Carotid bruit is not present. No thyromegaly present.  Cardiovascular: Normal rate, regular rhythm, normal heart sounds and intact distal pulses.  Exam reveals no gallop and no friction rub.   No murmur heard. Pulmonary/Chest: Effort normal and breath sounds normal. He has no wheezes. He has no rales.  Abdominal: Soft. Bowel sounds are normal. He exhibits no distension and no mass. There is no tenderness. There is no rebound and no guarding.  Lymphadenopathy:    He has no cervical adenopathy.  Neurological: He is alert and oriented to person, place, and time. No cranial nerve deficit. He exhibits normal muscle tone. Coordination normal.  Skin: Skin is warm and dry. No rash noted. He is not diaphoretic.  Psychiatric: He has a normal mood and affect. His behavior is normal. Judgment and thought content normal.  Nursing note and vitals reviewed.  Results for orders placed or performed in visit on 07/21/14  CBC with Differential  Result Value Ref Range   WBC 8.2 4.0 - 10.3 10e3/uL   NEUT# 5.7 1.5 - 6.5 10e3/uL   HGB 14.6 13.0 - 17.1 g/dL   HCT 43.7 38.4 - 49.9 %   Platelets 188 140 - 400 10e3/uL   MCV 83.1 79.3 - 98.0 fL   MCH 27.8 27.2 - 33.4 pg   MCHC 33.4 32.0 - 36.0 g/dL   RBC 5.26 4.20 - 5.82 10e6/uL   RDW 15.3 (H) 11.0 - 14.6 %   lymph# 2.0 0.9 - 3.3 10e3/uL   MONO# 0.4 0.1 - 0.9 10e3/uL   Eosinophils Absolute 0.1 0.0 - 0.5 10e3/uL   Basophils Absolute 0.1 0.0 - 0.1 10e3/uL   NEUT% 69.3 39.0 - 75.0 %   LYMPH% 24.3 14.0 - 49.0 %   MONO% 4.8 0.0 - 14.0 %   EOS% 0.9 0.0 - 7.0 %   BASO% 0.7 0.0 - 2.0 %  Comprehensive metabolic panel  Result Value Ref Range   Sodium 141 136 - 145 mEq/L   Potassium 4.4 3.5 - 5.1 mEq/L   Chloride 105 98 - 109 mEq/L   CO2 23 22 - 29 mEq/L   Glucose 121 70 - 140 mg/dl   BUN 17.8 7.0 - 26.0 mg/dL   Creatinine 1.0 0.7 - 1.3 mg/dL   Total  Bilirubin 1.47 (H) 0.20 - 1.20 mg/dL   Alkaline Phosphatase 77 40 - 150 U/L   AST 16 5 - 34 U/L   ALT 49 0 - 55 U/L   Total Protein 7.3 6.4 - 8.3 g/dL   Albumin 3.9 3.5 - 5.0 g/dL   Calcium 9.5 8.4 - 10.4 mg/dL   Anion Gap 13 (H) 3 - 11 mEq/L   EGFR >90 >90 ml/min/1.73 m2       Assessment & Plan:   1. Migraine without aura and without status migrainosus, not intractable   2. CML (chronic myelocytic leukemia)  3. Allergic rhinitis due to pollen     1. Migraines: stable; refill of Imitrex, Meloxicam, Percocet provided. 2.  CML: stable; followed every four months by oncology. 3.  Allergic Rhinitis: stable; no change in management.   Meds ordered this encounter  Medications  . DISCONTD: oxyCODONE-acetaminophen (PERCOCET) 10-325 MG per tablet    Sig: Take 1 tablet by mouth every 4 (four) hours as needed for pain.  [name] DISCONTD: meloxicam (MOBIC) 15 MG tablet    Sig: Take 1 tablet (15 mg total) by mouth daily. PATIENT NEEDS OFFICE VISIT FOR ADDITIONAL REFILLS    Dispense:  30 tablet    Refill:  5  . DISCONTD: SUMAtriptan (IMITREX) 100 MG tablet    Sig: Take 1 tablet at onset of headache. May repeat in 2 hrs if needed, MAX 2 tabs/24 hrs.PATIENT NEEDS OFFICE VISIT FOR ADDITIONAL REFILLS    Dispense:  9 tablet    Refill:  5  . SUMAtriptan (IMITREX) 100 MG tablet    Sig: Take 1 tablet at onset of headache. May repeat in 2 hrs if needed, MAX 2 tabs/24 hrs.    Dispense:  9 tablet    Refill:  5  . meloxicam (MOBIC) 15 MG tablet    Sig: Take 1 tablet (15 mg total) by mouth daily. PATIENT NEEDS OFFICE VISIT FOR ADDITIONAL REFILLS    Dispense:  30 tablet    Refill:  5  . oxyCODONE-acetaminophen (PERCOCET) 10-325 MG per tablet    Sig: Take 1 tablet by mouth every 6 (six) hours as needed for pain.    Dispense:  30 tablet    Refill:  0    Return in about 1 year (around 12/14/2015) for recheck.    Jamelle Goldston Elayne Guerin, M.D. [facility] [address] [place], [place]  [address] [phone] phone [phone] fax

## 2014-12-14 NOTE — Patient Instructions (Signed)

## 2015-01-21 ENCOUNTER — Ambulatory Visit: Payer: BLUE CROSS/BLUE SHIELD | Admitting: Oncology

## 2015-01-21 ENCOUNTER — Other Ambulatory Visit: Payer: BLUE CROSS/BLUE SHIELD

## 2015-02-03 ENCOUNTER — Other Ambulatory Visit: Payer: Self-pay | Admitting: *Deleted

## 2015-02-03 ENCOUNTER — Ambulatory Visit (INDEPENDENT_AMBULATORY_CARE_PROVIDER_SITE_OTHER): Payer: BLUE CROSS/BLUE SHIELD | Admitting: Family Medicine

## 2015-02-03 ENCOUNTER — Encounter: Payer: Self-pay | Admitting: Family Medicine

## 2015-02-03 VITALS — BP 131/86 | HR 64 | Temp 98.4°F | Resp 16 | Ht 70.0 in | Wt 214.0 lb

## 2015-02-03 DIAGNOSIS — R05 Cough: Secondary | ICD-10-CM | POA: Diagnosis not present

## 2015-02-03 DIAGNOSIS — C9210 Chronic myeloid leukemia, BCR/ABL-positive, not having achieved remission: Secondary | ICD-10-CM

## 2015-02-03 DIAGNOSIS — R059 Cough, unspecified: Secondary | ICD-10-CM

## 2015-02-03 MED ORDER — HYDROCOD POLST-CPM POLST ER 10-8 MG/5ML PO SUER: 5.0000 mL | Freq: Every evening | ORAL | Status: DC | PRN

## 2015-02-03 MED ORDER — NILOTINIB HCL 150 MG PO CAPS: 300.0000 mg | ORAL_CAPSULE | Freq: Two times a day (BID) | ORAL | Status: DC

## 2015-02-03 MED ORDER — BENZONATATE 100 MG PO CAPS: 100.0000 mg | ORAL_CAPSULE | Freq: Three times a day (TID) | ORAL | Status: DC | PRN

## 2015-02-03 NOTE — Progress Notes (Signed)
Subjective:    Patient ID: Nathan Kerr, male    DOB: [birth year], 29 y.o.   MRN: [id number]  HPI Patient presents today with several months of cough. He notes it started after his office visit with Dr. Tamala Julian 12/14/14. Started with tickle and has progressed to nonproductive, frequent cough. He is a nonsmoker. Denies frequent allergy symptoms. Had similar symptoms several years ago and got good relief with Tessalon pearls. Feels like there is sputum in upper anterior airway and sometimes hears a rattle. Of note, he complained of occasional nonproductive cough 07/21/14 at his visit with Dr. Alen Blew.   He has tried Mucinex and Mucinex D without relief.   He otherwise feels well.   Past Medical History  Diagnosis Date  . Chronic myeloid leukemia in remission   . CML (chronic myelocytic leukemia)   . Allergy   . Asthma   . Migraine    Past Surgical History  Procedure Laterality Date  . Appendectomy     Family History  Problem Relation Age of Onset  . Sleep apnea Mother   . Migraines Father    Social History  Substance Use Topics  . Smoking status: Never Smoker   . Smokeless tobacco: None  . Alcohol Use: Yes     Comment: occasional   Review of Systems No fevers/chills, no chest pain, no edema, no runny nose, no sore throat, no ear pain, no SOB, no wheeze. No abdominal pain, no nausea/vomiting. No fatigue.     Objective:   Physical Exam  Constitutional: He is oriented to person, place, and time. He appears well-developed and well-nourished. No distress.  HENT:  Head: Normocephalic and atraumatic.  Right Ear: Tympanic membrane, external ear and ear canal normal.  Left Ear: Tympanic membrane, external ear and ear canal normal.  Nose: Nose normal.  Mouth/Throat: Uvula is midline and mucous membranes are normal. Posterior oropharyngeal erythema present. No oropharyngeal exudate, posterior oropharyngeal edema or tonsillar abscesses.  Eyes: Conjunctivae are normal.  Cardiovascular:  Normal rate, regular rhythm and normal heart sounds.   Pulmonary/Chest: Effort normal and breath sounds normal. No respiratory distress. He has no wheezes. He has no rales. He exhibits no tenderness.  Musculoskeletal: Normal range of motion.  Lymphadenopathy:    He has no cervical adenopathy.  Neurological: He is alert and oriented to person, place, and time.  Skin: Skin is warm and dry. He is not diaphoretic.  Psychiatric: He has a normal mood and affect. His behavior is normal. Judgment and thought content normal.  Vitals reviewed.  BP 131/86 mmHg  Pulse 64  Temp(Src) 98.4 F (36.9 C) (Oral)  Resp 16  Ht 5\' 10"  (1.778 m)  Wt 214 lb (97.07 kg)  BMI 30.71 kg/m2  SpO2 98% Wt Readings from Last 3 Encounters:  02/03/15 214 lb (97.07 kg)  12/14/14 210 lb 6.4 oz (95.437 kg)  07/21/14 216 lb 3.2 oz (98.068 kg)      Assessment & Plan:  1. Cough - suspect this is either post viral or cyclical. He does have some erythema of his throat so could be related to allergies.  - Will treat symptomatically and patient agreed to return in 2 weeks if cough not resolved, sooner if he gets worse or develops new symptoms.  - benzonatate (TESSALON) 100 MG capsule; Take 1-2 capsules (100-200 mg total) by mouth 3 (three) times daily as needed for cough.  Dispense: 40 capsule; Refill: 0 - chlorpheniramine-HYDROcodone (TUSSIONEX PENNKINETIC ER) 10-8 MG/5ML SUER; Take 5 mLs by  mouth at bedtime as needed for cough.  Dispense: 70 mL; Refill: 0  Clarene Reamer, FNP-BC  [facility]  02/03/2015 10:11 AM

## 2015-02-03 NOTE — Patient Instructions (Signed)
Cough, Adult  A cough is a reflex that helps clear your throat and airways. It can help heal the body or may be a reaction to an irritated airway. A cough may only last 2 or 3 weeks (acute) or may last more than 8 weeks (chronic).  CAUSES Acute cough:  Viral or bacterial infections. Chronic cough:  Infections.  Allergies.  Asthma.  Post-nasal drip.  Smoking.  Heartburn or acid reflux.  Some medicines.  Chronic lung problems (COPD).  Cancer. SYMPTOMS   Cough.  Fever.  Chest pain.  Increased breathing rate.  High-pitched whistling sound when breathing (wheezing).  Colored mucus that you cough up (sputum). TREATMENT   A bacterial cough may be treated with antibiotic medicine.  A viral cough must run its course and will not respond to antibiotics.  Your caregiver may recommend other treatments if you have a chronic cough. HOME CARE INSTRUCTIONS   Only take over-the-counter or prescription medicines for pain, discomfort, or fever as directed by your caregiver. Use cough suppressants only as directed by your caregiver.  Use a cold steam vaporizer or humidifier in your bedroom or home to help loosen secretions.  Sleep in a semi-upright position if your cough is worse at night.  Rest as needed.  Stop smoking if you smoke. SEEK IMMEDIATE MEDICAL CARE IF:   You have pus in your sputum.  Your cough starts to worsen.  You cannot control your cough with suppressants and are losing sleep.  You begin coughing up blood.  You have difficulty breathing.  You develop pain which is getting worse or is uncontrolled with medicine.  You have a fever. MAKE SURE YOU:   Understand these instructions.  Will watch your condition.  Will get help right away if you are not doing well or get worse. Document Released: 10/21/2010 Document Revised: 07/17/2011 Document Reviewed: 10/21/2010 Exit[employer] Patient Information 2015 [employer]. This information is not intended  to replace advice given to you by your health care provider. Make sure you discuss any questions you have with your health care provider.  

## 2015-05-17 ENCOUNTER — Telehealth: Payer: Self-pay | Admitting: Oncology

## 2015-05-17 NOTE — Telephone Encounter (Signed)
Patient called to r/s 01/21/15 appt to 01/19

## 2015-05-27 ENCOUNTER — Other Ambulatory Visit: Payer: Self-pay | Admitting: *Deleted

## 2015-05-27 ENCOUNTER — Telehealth: Payer: Self-pay | Admitting: Oncology

## 2015-05-27 ENCOUNTER — Ambulatory Visit (HOSPITAL_BASED_OUTPATIENT_CLINIC_OR_DEPARTMENT_OTHER): Payer: BLUE CROSS/BLUE SHIELD | Admitting: Oncology

## 2015-05-27 ENCOUNTER — Other Ambulatory Visit (HOSPITAL_BASED_OUTPATIENT_CLINIC_OR_DEPARTMENT_OTHER): Payer: BLUE CROSS/BLUE SHIELD

## 2015-05-27 VITALS — BP 134/83 | HR 71 | Temp 98.4°F | Resp 18 | Ht 70.0 in | Wt 214.2 lb

## 2015-05-27 DIAGNOSIS — C9211 Chronic myeloid leukemia, BCR/ABL-positive, in remission: Secondary | ICD-10-CM | POA: Diagnosis not present

## 2015-05-27 DIAGNOSIS — G43909 Migraine, unspecified, not intractable, without status migrainosus: Secondary | ICD-10-CM

## 2015-05-27 DIAGNOSIS — C9210 Chronic myeloid leukemia, BCR/ABL-positive, not having achieved remission: Secondary | ICD-10-CM

## 2015-05-27 LAB — COMPREHENSIVE METABOLIC PANEL
ALBUMIN: 4.3 g/dL (ref 3.5–5.0)
ALK PHOS: 89 U/L (ref 40–150)
ALT: 61 U/L — ABNORMAL HIGH (ref 0–55)
AST: 21 U/L (ref 5–34)
Anion Gap: 9 mEq/L (ref 3–11)
BILIRUBIN TOTAL: 1.66 mg/dL — AB (ref 0.20–1.20)
BUN: 12 mg/dL (ref 7.0–26.0)
CALCIUM: 9.5 mg/dL (ref 8.4–10.4)
CO2: 24 mEq/L (ref 22–29)
Chloride: 107 mEq/L (ref 98–109)
Creatinine: 1 mg/dL (ref 0.7–1.3)
EGFR: >90 mL/min/{1.73_m2} (ref >90)
Glucose: 98 mg/dl (ref 70–140)
Potassium: 3.8 mEq/L (ref 3.5–5.1)
Sodium: 139 mEq/L (ref 136–145)
TOTAL PROTEIN: 8.2 g/dL (ref 6.4–8.3)

## 2015-05-27 LAB — CBC WITH DIFFERENTIAL/PLATELET
BASO%: 0.5 % (ref 0.0–2.0)
Basophils Absolute: 0 10*3/uL (ref 0.0–0.1)
EOS ABS: 0 10*3/uL (ref 0.0–0.5)
EOS%: 0.5 % (ref 0.0–7.0)
HEMATOCRIT: 43.9 % (ref 38.4–49.9)
HEMOGLOBIN: 14.9 g/dL (ref 13.0–17.1)
LYMPH#: 1.6 10*3/uL (ref 0.9–3.3)
LYMPH%: 18.5 % (ref 14.0–49.0)
MCH: 26.9 pg — ABNORMAL LOW (ref 27.2–33.4)
MCHC: 33.9 g/dL (ref 32.0–36.0)
MCV: 79.2 fL — AB (ref 79.3–98.0)
MONO#: 0.4 10*3/uL (ref 0.1–0.9)
MONO%: 4.2 % (ref 0.0–14.0)
NEUT%: 76.3 % — ABNORMAL HIGH (ref 39.0–75.0)
NEUTROS ABS: 6.6 10*3/uL — AB (ref 1.5–6.5)
Platelets: 195 10*3/uL (ref 140–400)
RBC: 5.54 10*6/uL (ref 4.20–5.82)
RDW: 16.2 % — AB (ref 11.0–14.6)
WBC: 8.6 10*3/uL (ref 4.0–10.3)

## 2015-05-27 MED ORDER — NILOTINIB HCL 150 MG PO CAPS: 300.0000 mg | ORAL_CAPSULE | Freq: Two times a day (BID) | ORAL | Status: DC

## 2015-05-27 NOTE — Progress Notes (Signed)
Hematology and Oncology Follow Up Visit  Nathan Kerr FX:[id number] 1986/03/29 30 y.o. 05/27/2015 1:56 PM   CC: Nathan Kerr, M.D.    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   Prior Therapy:  1. Patient treated with Gleevec 400 mg daily between April of 2010 until September of 2010. 2.     Patient treated with Gleevec 200 mg daily due to severe thrombocytopenia and subsequently switched to Tasigna for lack of efficacy and poor tolerance.   Current therapy: He is on Tasigna 300 mg tablet b.i.d. total 600 mg daily, started in August of 2011.  He had a complete hematological remission and near molecular remission at this time. He presented with a relapse in November of 2014 with elevated white cell count close to 300,000 range after he stopped taking his medication due to poor compliance.   He was restarted in November of 2014 had been in remission since that time.  Interim History: Nathan Kerr presents today for a followup visit with his wife. Since the last visit, he continues to do well without any recent complaints. He reports takingTasigna BID without any new complications. His adherence is much better at this time and have missed a few doses. He does not report any side effects of this medication including dyspnea on exertion, fatigue but does report weight gain.   He is no longer reporting any abdominal pain. Has not reported any early satiety or constitutional symptoms. He continues to work full-time. He does report migraine headaches which have been chronic preceding his diagnosis of leukemia.   He does not report any blurry vision, syncope or seizures. Does not report any fevers, chills or sweats. Has not reported any shortness of breath or cough. Has not reported any hemoptysis or hematemesis. Has not reported easy bruisability or bleeding. He does not report any lymphadenopathy or petechiae. Rest of  his review of systems unremarkable.  Medications: I have reviewed the patient's current medications. Current Outpatient Prescriptions  Medication Sig Dispense Refill  . benzonatate (TESSALON) 100 MG capsule Take 1-2 capsules (100-200 mg total) by mouth 3 (three) times daily as needed for cough. 40 capsule 0  . chlorpheniramine-HYDROcodone (TUSSIONEX PENNKINETIC ER) 10-8 MG/5ML SUER Take 5 mLs by mouth at bedtime as needed for cough. 70 mL 0  . meloxicam (MOBIC) 15 MG tablet Take 1 tablet (15 mg total) by mouth daily. PATIENT NEEDS OFFICE VISIT FOR ADDITIONAL REFILLS 30 tablet 5  . nilotinib (TASIGNA) 150 MG capsule Take 2 capsules (300 mg total) by mouth every 12 (twelve) hours. 460 capsule 2  . oxyCODONE-acetaminophen (PERCOCET) 10-325 MG per tablet Take 1 tablet by mouth every 6 (six) hours as needed for pain. 30 tablet 0  . SUMAtriptan (IMITREX) 100 MG tablet Take 1 tablet at onset of headache. May repeat in 2 hrs if needed, MAX 2 tabs/24 hrs. 9 tablet 5   Current Facility-Administered Medications  Medication Dose Route Frequency Provider Last Rate Last Dose  . ondansetron (ZOFRAN-ODT) disintegrating tablet 8 mg  8 mg Oral Once Leandrew Koyanagi, MD        Allergies: No Known Allergies  Past Medical History, Surgical history, Social history, and Family History were reviewed and updated.  Review of Systems:  Remaining ROS negative.  Physical Exam: Blood pressure 134/83, pulse 71, temperature 98.4 F (36.9 C), temperature source Oral, resp. rate 18, height 5\' 10"  (1.778 m), weight 214 lb 3.2 oz (97.16  kg), SpO2 100 %. ECOG: 0 General appearance: alert awake pleasant gentleman without distress. Head: Normocephalic, without obvious abnormality no oral ulcers or lesions. Neck: no adenopathy Lymph nodes: Cervical, supraclavicular, and axillary nodes normal. Heart:regular rate and rhythm, S1, S2 normal, no murmur, click, rub or gallop Lung:chest clear, no wheezing, rales, normal  symmetric air entry Abdomen: Soft and nontender. Good bowel sounds without any splenomegaly. EXT:no erythema, induration, or nodules Neurological exam: No deficits retinal hemorrhages noted on examination  Lab Results: Lab Results  Component Value Date   WBC 8.6 05/27/2015   HGB 14.9 05/27/2015   HCT 43.9 05/27/2015   MCV 79.2* 05/27/2015   PLT 195 05/27/2015     Chemistry      Component Value Date/Time   NA 141 07/21/2014 1003   NA 142 03/11/2013 1321   K 4.4 07/21/2014 1003   K 3.2* 03/11/2013 1321   CL 104 03/11/2013 1321   CO2 23 07/21/2014 1003   CO2 28 03/11/2013 1321   BUN 17.8 07/21/2014 1003   BUN 16 03/11/2013 1321   CREATININE 1.0 07/21/2014 1003   CREATININE 1.02 03/11/2013 1321   CREATININE 0.96 12/27/2011 1019      Component Value Date/Time   CALCIUM 9.5 07/21/2014 1003   CALCIUM 9.6 03/11/2013 1321   ALKPHOS 77 07/21/2014 1003   ALKPHOS 66 03/11/2013 1321   AST 16 07/21/2014 1003   AST 23 03/11/2013 1321   ALT 49 07/21/2014 1003   ALT 16 03/11/2013 1321   BILITOT 1.47* 07/21/2014 1003   BILITOT 1.4* 03/11/2013 1321         Impression and Plan:  30 year old gentleman with the following issues: 1. Chronic phase chronic myelogenous leukemia. He has been on Tasigna continuously since November 2014 with complete hematological remission. His molecular testing was done today is currently pending. The plan is to continue with same dose and schedule and continue monitoring him periodically.  2. Migraine headaches: Chronic in nature and proceeded his leukemia. Seems to be manageable it's time.  3. Follow-up. Will be in 6 months for now.   [id number] 1/19/20171:56 PM

## 2015-05-27 NOTE — Telephone Encounter (Signed)
Gv pt appt for 12/23/15.

## 2015-05-27 NOTE — Telephone Encounter (Signed)
[facility]ed signed [facility] [phone]

## 2015-07-02 ENCOUNTER — Encounter: Payer: Self-pay | Admitting: Oncology

## 2015-09-24 ENCOUNTER — Telehealth: Payer: Self-pay

## 2015-09-24 NOTE — Telephone Encounter (Signed)
meloxicam (MOBIC) 15 MG tablet he wants it sent from the [facility] on [address] to [facility] [address].  Please advise  [phone]

## 2015-09-24 NOTE — Telephone Encounter (Signed)
Ok to fill 

## 2015-09-24 NOTE — Telephone Encounter (Signed)
Needs visit

## 2015-12-23 ENCOUNTER — Other Ambulatory Visit: Payer: BLUE CROSS/BLUE SHIELD

## 2015-12-23 ENCOUNTER — Ambulatory Visit: Payer: BLUE CROSS/BLUE SHIELD | Admitting: Oncology

## 2016-01-15 ENCOUNTER — Other Ambulatory Visit: Payer: Self-pay | Admitting: Family Medicine

## 2016-06-21 ENCOUNTER — Ambulatory Visit: Payer: BLUE CROSS/BLUE SHIELD | Admitting: Family Medicine

## 2016-07-05 ENCOUNTER — Encounter: Payer: Self-pay | Admitting: Family Medicine

## 2016-07-05 ENCOUNTER — Ambulatory Visit (INDEPENDENT_AMBULATORY_CARE_PROVIDER_SITE_OTHER): Payer: 59 | Admitting: Family Medicine

## 2016-07-05 VITALS — BP 123/80 | HR 83 | Temp 99.9°F | Resp 18 | Ht 70.0 in | Wt 224.0 lb

## 2016-07-05 DIAGNOSIS — R002 Palpitations: Secondary | ICD-10-CM | POA: Diagnosis not present

## 2016-07-05 DIAGNOSIS — R040 Epistaxis: Secondary | ICD-10-CM | POA: Diagnosis not present

## 2016-07-05 DIAGNOSIS — J301 Allergic rhinitis due to pollen: Secondary | ICD-10-CM

## 2016-07-05 DIAGNOSIS — G43009 Migraine without aura, not intractable, without status migrainosus: Secondary | ICD-10-CM | POA: Diagnosis not present

## 2016-07-05 DIAGNOSIS — E6609 Other obesity due to excess calories: Secondary | ICD-10-CM | POA: Diagnosis not present

## 2016-07-05 DIAGNOSIS — Z6832 Body mass index (BMI) 32.0-32.9, adult: Secondary | ICD-10-CM

## 2016-07-05 DIAGNOSIS — C9210 Chronic myeloid leukemia, BCR/ABL-positive, not having achieved remission: Secondary | ICD-10-CM

## 2016-07-05 MED ORDER — MELOXICAM 15 MG PO TABS: 15.0000 mg | ORAL_TABLET | Freq: Every day | ORAL | 3 refills | Status: DC

## 2016-07-05 MED ORDER — RIZATRIPTAN BENZOATE 10 MG PO TBDP: 10.0000 mg | ORAL_TABLET | ORAL | 5 refills | Status: DC | PRN

## 2016-07-05 MED ORDER — OXYCODONE-ACETAMINOPHEN 10-325 MG PO TABS: 1.0000 | ORAL_TABLET | Freq: Four times a day (QID) | ORAL | 0 refills | Status: DC | PRN

## 2016-07-05 NOTE — Patient Instructions (Signed)
     IF you received an x-ray today, you will receive an invoice from [facility]. Please contact [facility] at [phone] with questions or concerns regarding your invoice.   IF you received labwork today, you will receive an invoice from [facility]. Please contact [facility] at [phone] with questions or concerns regarding your invoice.   Our billing staff will not be able to assist you with questions regarding bills from these companies.  You will be contacted with the lab results as soon as they are available. The fastest way to get your results is to activate your My Chart account. Instructions are located on the last page of this paperwork. If you have not heard from us regarding the results in 2 weeks, please contact this office.     

## 2016-07-05 NOTE — Progress Notes (Signed)
Subjective:    Patient ID: Nathan Kerr, male    DOB: December 31, 1985, 31 y.o.   MRN: [id number]  07/05/2016  Medication Refill (Meloxicam, Imitrex)   HPI This 31 y.o. male presents for evaluation of migraines.  Mild headaches every 2 weeks.  Regimen, takes Meloxicam; if in one hour headache persists, takes Sumitriptan; if does not work, takes oxycodone.  Does not like how feels with Imitrex; gets nervous and flushed.   Palpitations: stronger heart rhythm.   Notice heart beat.  Occurs twice per day.  Has occurred in life but once. More frequent.  No increase caffeine intake.   Can have 2-4 days per coffee.  No soda or tea.  Some stress due to new job; commission base.  Sleeping well; sleeping  8 hours per night.  Exercising 3 nights per week.  Will start bike riding.   Tolerating palpitations.  Onset not sure.  2-3 weeks ago.   There is no immunization history on file for this patient. BP Readings from Last 3 Encounters:  07/05/16 123/80  05/27/15 134/83  02/03/15 131/86   Wt Readings from Last 3 Encounters:  07/05/16 224 lb (101.6 kg)  05/27/15 214 lb 3.2 oz (97.2 kg)  02/03/15 214 lb (97.1 kg)    Simmons Gorilla Test.  Referral to ENT; having nose bleeds again.  Sneezed with bleeding.  Overdue for follow-up with oncology.   Review of Systems  Constitutional: Negative for activity change, appetite change, chills, diaphoresis, fatigue and fever.  HENT: Positive for congestion, nosebleeds, postnasal drip and rhinorrhea. Negative for ear discharge, ear pain, sinus pain, sinus pressure, sneezing, sore throat, trouble swallowing and voice change.   Eyes: Negative for visual disturbance.  Respiratory: Negative for cough and shortness of breath.   Cardiovascular: Positive for palpitations. Negative for chest pain and leg swelling.  Endocrine: Negative for cold intolerance, heat intolerance, polydipsia, polyphagia and polyuria.  Neurological: Negative for dizziness, tremors, seizures,  syncope, facial asymmetry, speech difficulty, weakness, light-headedness, numbness and headaches.    Past Medical History:  Diagnosis Date  . Allergy   . Asthma   . 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 CML (chronic myelocytic leukemia) ([place])   . Migraine    Past Surgical History:  Procedure Laterality Date  . APPENDECTOMY     No Known Allergies  Social History   Social History  . Marital status: Married    Spouse name: N/A  . Number of children: 0  . Years of education: N/A   Occupational History  .  Interior and spatial designer for  [employer]   Social History Main Topics  . Smoking status: Never Smoker  . Smokeless tobacco: Never Used  . Alcohol use Yes     Comment: occasional  . Drug use: No  . Sexual activity: Yes    Partners: Female   Other Topics Concern  . Not on file   Social History Narrative  . No narrative on file   Family History  Problem Relation Age of Onset  . Sleep apnea Mother   . Migraines Father        Objective:    BP 123/80   Pulse 83   Temp 99.9 F (37.7 C) (Oral)   Resp 18   Ht 5\' 10"  (1.778 m)   Wt 224 lb (101.6 kg)   SpO2 98%   BMI 32.14 kg/m  Physical Exam  Constitutional: He is oriented to person, place, and time. He appears well-developed  and well-nourished. No distress.  HENT:  Head: Normocephalic and atraumatic.  Right Ear: External ear normal.  Left Ear: External ear normal.  Nose: Nose normal.  Mouth/Throat: Oropharynx is clear and moist.  Eyes: Conjunctivae and EOM are normal. Pupils are equal, round, and reactive to light.  Neck: Normal range of motion. Neck supple. Carotid bruit is not present. No thyromegaly present.  Cardiovascular: Normal rate, regular rhythm, normal heart sounds and intact distal pulses.  Exam reveals no gallop and no friction rub.   No murmur heard. Pulmonary/Chest: Effort normal and breath sounds normal. He has no wheezes. He has no rales.  Abdominal: Soft. Bowel sounds are  normal. He exhibits no distension and no mass. There is no tenderness. There is no rebound and no guarding.  Lymphadenopathy:    He has no cervical adenopathy.  Neurological: He is alert and oriented to person, place, and time. No cranial nerve deficit.  Skin: Skin is warm and dry. No rash noted. He is not diaphoretic.  Psychiatric: He has a normal mood and affect. His behavior is normal.  Nursing note and vitals reviewed.       Assessment & Plan:   1. Migraine without aura and without status migrainosus, not intractable   2. Palpitations   3. Epistaxis   4. Class 1 obesity due to excess calories without serious comorbidity with body mass index (BMI) of 32.0 to 32.9 in adult   5. CML (chronic myelocytic leukemia) ([place])   6. Acute seasonal allergic rhinitis due to pollen    -new onset palpitations; obtain labs; refer to cardiology for further evaluation.  Recent stress of new job; thus, likely stress related; recommend increasing exercise. -recurrent epistaxis associated with allergic rhinitis; refer to ENT per pt request; recommend nasal saline spray bid.  Also recommend treating underlying allergic rhinitis aggressively with daily maintenance therapy including Flonase and nasal saline spray. -migraines stable; will switch Imitrex to Maxalt; refill of Mobic and Percocet provided. -reminded pt to schedule annual follow-up with oncology for CML.   -also recommend weight loss, exercise, and low-calorie diet due to obesity with BMI 32.   Orders Placed This Encounter  Procedures  . CBC with Differential/Platelet  . Comprehensive metabolic panel  . TSH  . Ambulatory referral to ENT    Referral Priority:   Routine    Referral Type:   Consultation    Referral Reason:   Specialty Services Required    Requested Specialty:   Otolaryngology    Number of Visits Requested:   1  . POCT urinalysis dipstick  . EKG 12-Lead   Meds ordered this encounter  Medications  . DISCONTD: meloxicam  (MOBIC) 15 MG tablet    Sig: Take 1 tablet (15 mg total) by mouth daily. PATIENT NEEDS OFFICE VISIT FOR ADDITIONAL REFILLS    Dispense:  30 tablet    Refill:  3  . oxyCODONE-acetaminophen (PERCOCET) 10-325 MG tablet    Sig: Take 1 tablet by mouth every 6 (six) hours as needed for pain.    Dispense:  30 tablet    Refill:  0  . DISCONTD: rizatriptan (MAXALT-MLT) 10 MG disintegrating tablet    Sig: Take 1 tablet (10 mg total) by mouth as needed for migraine. Repeat in 2 hours if needed.    Dispense:  8 tablet    Refill:  5  . meloxicam (MOBIC) 15 MG tablet    Sig: Take 1 tablet (15 mg total) by mouth daily. PATIENT NEEDS OFFICE VISIT FOR  ADDITIONAL REFILLS    Dispense:  30 tablet    Refill:  3  . rizatriptan (MAXALT-MLT) 10 MG disintegrating tablet    Sig: Take 1 tablet (10 mg total) by mouth as needed for migraine. Repeat in 2 hours if needed.    Dispense:  8 tablet    Refill:  5    No Follow-up on file.   Shaan Rhoads Elayne Guerin, M.D. Primary Care at [facility] [address] [place], [place]  [address] [phone] phone [phone] fax

## 2016-07-06 LAB — CBC WITH DIFFERENTIAL/PLATELET
BASOS: 0 %
Basophils Absolute: 0.1 10*3/uL (ref 0.0–0.2)
EOS (ABSOLUTE): 0.2 10*3/uL (ref 0.0–0.4)
EOS: 1 %
HEMATOCRIT: 40.8 % (ref 37.5–51.0)
HEMOGLOBIN: 13.3 g/dL (ref 13.0–17.7)
IMMATURE GRANS (ABS): 0.1 10*3/uL (ref 0.0–0.1)
IMMATURE GRANULOCYTES: 1 %
LYMPHS: 18 %
Lymphocytes Absolute: 2.4 10*3/uL (ref 0.7–3.1)
MCH: 25.3 pg — AB (ref 26.6–33.0)
MCHC: 32.6 g/dL (ref 31.5–35.7)
MCV: 78 fL — AB (ref 79–97)
MONOCYTES: 4 %
Monocytes Absolute: 0.6 10*3/uL (ref 0.1–0.9)
NEUTROS ABS: 10.1 10*3/uL — AB (ref 1.4–7.0)
NEUTROS PCT: 76 %
PLATELETS: 218 10*3/uL (ref 150–379)
RBC: 5.26 x10E6/uL (ref 4.14–5.80)
RDW: 16.8 % — ABNORMAL HIGH (ref 12.3–15.4)
WBC: 13.4 10*3/uL — ABNORMAL HIGH (ref 3.4–10.8)

## 2016-07-06 LAB — COMPREHENSIVE METABOLIC PANEL
A/G RATIO: 1.6 (ref 1.2–2.2)
ALBUMIN: 4.4 g/dL (ref 3.5–5.5)
ALT: 52 IU/L — ABNORMAL HIGH (ref 0–44)
AST: 21 IU/L (ref 0–40)
Alkaline Phosphatase: 93 IU/L (ref 39–117)
BILIRUBIN TOTAL: 0.7 mg/dL (ref 0.0–1.2)
BUN / CREAT RATIO: 15 (ref 9–20)
BUN: 15 mg/dL (ref 6–20)
CALCIUM: 9.2 mg/dL (ref 8.7–10.2)
CHLORIDE: 100 mmol/L (ref 96–106)
CO2: 23 mmol/L (ref 18–29)
Creatinine, Ser: 0.98 mg/dL (ref 0.76–1.27)
GFR, EST AFRICAN AMERICAN: 118 mL/min/{1.73_m2} (ref >59)
GFR, EST NON AFRICAN AMERICAN: 102 mL/min/{1.73_m2} (ref >59)
Globulin, Total: 2.8 g/dL (ref 1.5–4.5)
Glucose: 86 mg/dL (ref 65–99)
POTASSIUM: 3.9 mmol/L (ref 3.5–5.2)
Sodium: 141 mmol/L (ref 134–144)
TOTAL PROTEIN: 7.2 g/dL (ref 6.0–8.5)

## 2016-07-06 LAB — TSH: TSH: 3.29 u[IU]/mL (ref 0.450–4.500)

## 2016-07-17 DIAGNOSIS — Z6832 Body mass index (BMI) 32.0-32.9, adult: Secondary | ICD-10-CM

## 2016-07-17 DIAGNOSIS — E6609 Other obesity due to excess calories: Secondary | ICD-10-CM | POA: Insufficient documentation

## 2016-07-18 ENCOUNTER — Telehealth: Payer: Self-pay | Admitting: Family Medicine

## 2016-07-18 NOTE — Telephone Encounter (Signed)
Pt states that he had lab work done on 07/05/16 and still has not gotten his results yet   Best number [phone]

## 2016-07-18 NOTE — Telephone Encounter (Signed)
Please post results

## 2016-07-18 NOTE — Telephone Encounter (Signed)
Results released via MyChart to patient four days after visit. I sent message to pt via MyChart.  Please also call with lab results in case he does not check his MyChart.

## 2016-07-19 NOTE — Telephone Encounter (Signed)
Pt advised of lab note

## 2016-08-24 ENCOUNTER — Telehealth: Payer: Self-pay | Admitting: Oncology

## 2016-08-24 NOTE — Telephone Encounter (Signed)
Pt called to r/s missed MD appt. Gave next available 5/9 at 0830 per request

## 2016-09-13 ENCOUNTER — Telehealth: Payer: Self-pay | Admitting: *Deleted

## 2016-09-13 ENCOUNTER — Telehealth: Payer: Self-pay | Admitting: Oncology

## 2016-09-13 ENCOUNTER — Ambulatory Visit (HOSPITAL_BASED_OUTPATIENT_CLINIC_OR_DEPARTMENT_OTHER): Payer: BLUE CROSS/BLUE SHIELD

## 2016-09-13 ENCOUNTER — Ambulatory Visit (HOSPITAL_BASED_OUTPATIENT_CLINIC_OR_DEPARTMENT_OTHER): Payer: Managed Care, Other (non HMO) | Admitting: Oncology

## 2016-09-13 ENCOUNTER — Other Ambulatory Visit: Payer: Self-pay | Admitting: *Deleted

## 2016-09-13 VITALS — BP 137/94 | HR 72 | Temp 98.7°F | Resp 18 | Ht 70.0 in | Wt 210.3 lb

## 2016-09-13 DIAGNOSIS — G43909 Migraine, unspecified, not intractable, without status migrainosus: Secondary | ICD-10-CM

## 2016-09-13 DIAGNOSIS — R61 Generalized hyperhidrosis: Secondary | ICD-10-CM | POA: Diagnosis not present

## 2016-09-13 DIAGNOSIS — C9210 Chronic myeloid leukemia, BCR/ABL-positive, not having achieved remission: Secondary | ICD-10-CM

## 2016-09-13 DIAGNOSIS — C9211 Chronic myeloid leukemia, BCR/ABL-positive, in remission: Secondary | ICD-10-CM

## 2016-09-13 LAB — COMPREHENSIVE METABOLIC PANEL
ALK PHOS: 103 U/L (ref 40–150)
ALT: 64 U/L — ABNORMAL HIGH (ref 0–55)
ANION GAP: 10 meq/L (ref 3–11)
AST: 13 U/L (ref 5–34)
Albumin: 3.8 g/dL (ref 3.5–5.0)
BUN: 16.7 mg/dL (ref 7.0–26.0)
CHLORIDE: 106 meq/L (ref 98–109)
CO2: 23 mEq/L (ref 22–29)
CREATININE: 1 mg/dL (ref 0.7–1.3)
Calcium: 9.7 mg/dL (ref 8.4–10.4)
GLUCOSE: 95 mg/dL (ref 70–140)
Potassium: 4 mEq/L (ref 3.5–5.1)
Sodium: 139 mEq/L (ref 136–145)
TOTAL PROTEIN: 7.9 g/dL (ref 6.4–8.3)
Total Bilirubin: 0.72 mg/dL (ref 0.20–1.20)

## 2016-09-13 LAB — CBC WITH DIFFERENTIAL/PLATELET
BASO%: 0.1 % (ref 0.0–2.0)
Basophils Absolute: 0 10*3/uL (ref 0.0–0.1)
EOS%: 0.1 % (ref 0.0–7.0)
Eosinophils Absolute: 0 10*3/uL (ref 0.0–0.5)
HCT: 42 % (ref 38.4–49.9)
HGB: 13.5 g/dL (ref 13.0–17.1)
LYMPH%: 16.6 % (ref 14.0–49.0)
MCH: 24.5 pg — ABNORMAL LOW (ref 27.2–33.4)
MCHC: 32.1 g/dL (ref 32.0–36.0)
MCV: 76.1 fL — AB (ref 79.3–98.0)
MONO#: 0.7 10*3/uL (ref 0.1–0.9)
MONO%: 5.9 % (ref 0.0–14.0)
NEUT%: 77.3 % — ABNORMAL HIGH (ref 39.0–75.0)
NEUTROS ABS: 9.3 10*3/uL — AB (ref 1.5–6.5)
PLATELETS: 154 10*3/uL (ref 140–400)
RBC: 5.52 10*6/uL (ref 4.20–5.82)
RDW: 16.5 % — ABNORMAL HIGH (ref 11.0–14.6)
WBC: 12 10*3/uL — ABNORMAL HIGH (ref 4.0–10.3)
lymph#: 2 10*3/uL (ref 0.9–3.3)

## 2016-09-13 NOTE — Telephone Encounter (Signed)
Called patient,  gave results of last labs, wbc elevated slightly, nothing to worry about.

## 2016-09-13 NOTE — Telephone Encounter (Signed)
-----   Message from Wyatt Portela, MD sent at 09/13/2016 11:01 AM EDT ----- Please let him know his blood looks good, His white cells are little up but no problem.

## 2016-09-13 NOTE — Telephone Encounter (Signed)
Gave patient AVS and calender per 5/9 los.  

## 2016-09-13 NOTE — Progress Notes (Signed)
Hematology and Oncology Follow Up Visit  Nathan Kerr [id number] 04-Dec-1985 31 y.o. 09/13/2016 9:20 AM   CC: [name], M.D.    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   Prior Therapy:  1. Patient treated with Gleevec 400 mg daily between April of 2010 until September of 2010. 2.     Patient treated with Gleevec 200 mg daily due to severe thrombocytopenia and subsequently switched to Tasigna for lack of efficacy and poor tolerance.   Current therapy: He is on Tasigna 300 mg tablet b.i.d. total 600 mg daily, started in August of 2011.  He had a complete hematological remission and near molecular remission at this time. He presented with a relapse in November of 2014 with elevated white cell count close to 300,000 range after he stopped taking his medication due to poor compliance.   He was restarted in November of 2014 had been in remission since that time.  Interim History: Nathan Kerr presents today for a followup visit. Since the last visit, he Missed multiple appointments and have recently rescheduled. He reports takingTasigna BID without any new complications. His adherence has been an issue and have missed multiple doses although recently strained to do better job in adherence. He does not report any side effects of this medication including dyspnea on exertion, fatigue but does report weight gain.  He denies any fevers, chills. He does report night sweats at times. He does not report any fatigue and able to perform work-related duties without any complications.   He does not report any blurry vision, syncope or seizures. Does not report any fevers, chills or sweats. Has not reported any shortness of breath or cough. Has not reported any hemoptysis or hematemesis. Has not reported easy bruisability or bleeding. He does not report any lymphadenopathy or petechiae. Rest of his review of systems  unremarkable.  Medications: I have reviewed the patient's current medications. Current Outpatient Prescriptions  Medication Sig Dispense Refill  . meloxicam (MOBIC) 15 MG tablet Take 1 tablet (15 mg total) by mouth daily. PATIENT NEEDS OFFICE VISIT FOR ADDITIONAL REFILLS 30 tablet 3  . nilotinib (TASIGNA) 150 MG capsule Take 2 capsules (300 mg total) by mouth every 12 (twelve) hours. 460 capsule 2  . oxyCODONE-acetaminophen (PERCOCET) 10-325 MG tablet Take 1 tablet by mouth every 6 (six) hours as needed for pain. 30 tablet 0  . rizatriptan (MAXALT-MLT) 10 MG disintegrating tablet Take 1 tablet (10 mg total) by mouth as needed for migraine. Repeat in 2 hours if needed. 8 tablet 5  . SUMAtriptan (IMITREX) 100 MG tablet Take 1 tablet at onset of headache. May repeat in 2 hrs if needed, MAX 2 tabs/24 hrs. 9 tablet 5   Current Facility-Administered Medications  Medication Dose Route Frequency Provider Last Rate Last Dose  . ondansetron (ZOFRAN-ODT) disintegrating tablet 8 mg  8 mg Oral Once Leandrew Koyanagi, MD        Allergies: No Known Allergies  Past Medical History, Surgical history, Social history, and Family History were reviewed and updated.  Review of Systems:  Remaining ROS negative.  Physical Exam: Blood pressure (!) 137/94, pulse 72, temperature 98.7 F (37.1 C), temperature source Oral, resp. rate 18, height 5\' 10"  (1.778 m), weight 210 lb 4.8 oz (95.4 kg), SpO2 100 %. ECOG: 0 General appearance: Well-appearing gentleman appeared without distress. Head: Normocephalic, without obvious abnormality no oral ulcers or lesions. Neck: no adenopathy or thyroid  masses. Lymph nodes: Cervical, supraclavicular, and axillary nodes normal. Heart:regular rate and rhythm, S1, S2 normal, no murmur, click, rub or gallop Lung:chest clear, no wheezing, rales, normal symmetric air entry Abdomen: Soft and nontender. Good bowel sounds. No splenomegaly. EXT:no erythema, induration, or  nodules Neurological exam: No deficits retinal hemorrhages noted on examination  Lab Results: Lab Results  Component Value Date   WBC 13.4 (H) 07/05/2016   HGB 14.9 05/27/2015   HCT 40.8 07/05/2016   MCV 78 (L) 07/05/2016   PLT 218 07/05/2016     Chemistry      Component Value Date/Time   NA 141 07/05/2016 1706   NA 139 05/27/2015 1330   K 3.9 07/05/2016 1706   K 3.8 05/27/2015 1330   CL 100 07/05/2016 1706   CO2 23 07/05/2016 1706   CO2 24 05/27/2015 1330   BUN 15 07/05/2016 1706   BUN 12.0 05/27/2015 1330   CREATININE 0.98 07/05/2016 1706   CREATININE 1.0 05/27/2015 1330      Component Value Date/Time   CALCIUM 9.2 07/05/2016 1706   CALCIUM 9.5 05/27/2015 1330   ALKPHOS 93 07/05/2016 1706   ALKPHOS 89 05/27/2015 1330   AST 21 07/05/2016 1706   AST 21 05/27/2015 1330   ALT 52 (H) 07/05/2016 1706   ALT 61 (H) 05/27/2015 1330   BILITOT 0.7 07/05/2016 1706   BILITOT 1.66 (H) 05/27/2015 1330         Impression and Plan:  31 year old gentleman with the following issues: 1. Chronic phase chronic myelogenous leukemia. He has been on Tasigna continuously since November 2014 with complete hematological remission. The plan is to repeat laboratory testing and molecular testing today. I continue to emphasize adherence to this medication at this time. If he continues to have less than adequate molecular response, switching him to a different agent might be needed.  2. Migraine headaches: Chronic in nature and proceeded his leukemia. Seems to be manageable it's time.   3. Follow-up. Will be in 6 months for now.   ULAGTX,MIWOE 5/9/20189:20 AM

## 2016-10-21 ENCOUNTER — Emergency Department (HOSPITAL_COMMUNITY): Payer: Managed Care, Other (non HMO)

## 2016-10-21 ENCOUNTER — Encounter (HOSPITAL_COMMUNITY): Payer: Self-pay | Admitting: Emergency Medicine

## 2016-10-21 ENCOUNTER — Inpatient Hospital Stay (HOSPITAL_COMMUNITY)
Admission: EM | Admit: 2016-10-21 | Discharge: 2016-10-23 | DRG: 167 | Disposition: A | Discharge status: Home / Self Care | Payer: Managed Care, Other (non HMO) | Attending: Internal Medicine | Admitting: Internal Medicine

## 2016-10-21 DIAGNOSIS — R918 Other nonspecific abnormal finding of lung field: Secondary | ICD-10-CM | POA: Diagnosis not present

## 2016-10-21 DIAGNOSIS — Z789 Other specified health status: Secondary | ICD-10-CM | POA: Diagnosis not present

## 2016-10-21 DIAGNOSIS — Z8669 Personal history of other diseases of the nervous system and sense organs: Secondary | ICD-10-CM

## 2016-10-21 DIAGNOSIS — T17908S Unspecified foreign body in respiratory tract, part unspecified causing other injury, sequela: Secondary | ICD-10-CM

## 2016-10-21 DIAGNOSIS — D72829 Elevated white blood cell count, unspecified: Secondary | ICD-10-CM

## 2016-10-21 DIAGNOSIS — D696 Thrombocytopenia, unspecified: Secondary | ICD-10-CM | POA: Diagnosis not present

## 2016-10-21 DIAGNOSIS — J45901 Unspecified asthma with (acute) exacerbation: Secondary | ICD-10-CM

## 2016-10-21 DIAGNOSIS — C9210 Chronic myeloid leukemia, BCR/ABL-positive, not having achieved remission: Secondary | ICD-10-CM | POA: Diagnosis present

## 2016-10-21 DIAGNOSIS — J189 Pneumonia, unspecified organism: Secondary | ICD-10-CM

## 2016-10-21 DIAGNOSIS — C9211 Chronic myeloid leukemia, BCR/ABL-positive, in remission: Secondary | ICD-10-CM | POA: Diagnosis present

## 2016-10-21 DIAGNOSIS — E876 Hypokalemia: Secondary | ICD-10-CM | POA: Diagnosis not present

## 2016-10-21 DIAGNOSIS — R0602 Shortness of breath: Secondary | ICD-10-CM | POA: Diagnosis not present

## 2016-10-21 DIAGNOSIS — J310 Chronic rhinitis: Secondary | ICD-10-CM | POA: Diagnosis present

## 2016-10-21 DIAGNOSIS — G43909 Migraine, unspecified, not intractable, without status migrainosus: Secondary | ICD-10-CM | POA: Diagnosis present

## 2016-10-21 DIAGNOSIS — R9389 Abnormal findings on diagnostic imaging of other specified body structures: Secondary | ICD-10-CM

## 2016-10-21 DIAGNOSIS — J309 Allergic rhinitis, unspecified: Secondary | ICD-10-CM | POA: Diagnosis present

## 2016-10-21 DIAGNOSIS — Z9889 Other specified postprocedural states: Secondary | ICD-10-CM

## 2016-10-21 LAB — URINALYSIS, ROUTINE W REFLEX MICROSCOPIC
Bilirubin Urine: NEGATIVE
GLUCOSE, UA: NEGATIVE mg/dL
HGB URINE DIPSTICK: NEGATIVE
KETONES UR: NEGATIVE mg/dL
Leukocytes, UA: NEGATIVE
Nitrite: NEGATIVE
PH: 5.5 (ref 5.0–8.0)
PROTEIN: NEGATIVE mg/dL
Specific Gravity, Urine: 1.02 (ref 1.005–1.030)

## 2016-10-21 LAB — CBC WITH DIFFERENTIAL/PLATELET
BASOS PCT: 1 %
Basophils Absolute: 0.3 10*3/uL — ABNORMAL HIGH (ref 0.0–0.1)
EOS PCT: 0 %
Eosinophils Absolute: 0 10*3/uL (ref 0.0–0.7)
HEMATOCRIT: 41.7 % (ref 39.0–52.0)
Hemoglobin: 13.5 g/dL (ref 13.0–17.0)
Lymphocytes Relative: 14 %
Lymphs Abs: 3.9 10*3/uL (ref 0.7–4.0)
MCH: 23.9 pg — AB (ref 26.0–34.0)
MCHC: 32.4 g/dL (ref 30.0–36.0)
MCV: 73.7 fL — AB (ref 78.0–100.0)
MONO ABS: 1.9 10*3/uL — AB (ref 0.1–1.0)
Monocytes Relative: 7 %
NEUTROS ABS: 21.5 10*3/uL — AB (ref 1.7–7.7)
Neutrophils Relative %: 78 %
PLATELETS: 89 10*3/uL — AB (ref 150–400)
RBC: 5.66 MIL/uL (ref 4.22–5.81)
RDW: 18.2 % — ABNORMAL HIGH (ref 11.5–15.5)
WBC: 27.6 10*3/uL — ABNORMAL HIGH (ref 4.0–10.5)

## 2016-10-21 LAB — BASIC METABOLIC PANEL
Anion gap: 9 (ref 5–15)
BUN: 16 mg/dL (ref 6–20)
CHLORIDE: 104 mmol/L (ref 101–111)
CO2: 27 mmol/L (ref 22–32)
CREATININE: 1.15 mg/dL (ref 0.61–1.24)
Calcium: 8.8 mg/dL — ABNORMAL LOW (ref 8.9–10.3)
GFR calc non Af Amer: >60 mL/min (ref >60)
Glucose, Bld: 82 mg/dL (ref 65–99)
POTASSIUM: 2.9 mmol/L — AB (ref 3.5–5.1)
SODIUM: 140 mmol/L (ref 135–145)

## 2016-10-21 LAB — MRSA PCR SCREENING: MRSA BY PCR: NEGATIVE

## 2016-10-21 MED ORDER — POTASSIUM CHLORIDE CRYS ER 20 MEQ PO TBCR
40.0000 meq | EXTENDED_RELEASE_TABLET | ORAL | Status: AC
  Administered 2016-10-21 (×2): 40 meq via ORAL
  Filled 2016-10-21 (×2): qty 2

## 2016-10-21 MED ORDER — SODIUM CHLORIDE 0.9 % IV BOLUS (SEPSIS)
1000.0000 mL | Freq: Once | INTRAVENOUS | Status: AC
  Administered 2016-10-21: 1000 mL via INTRAVENOUS

## 2016-10-21 MED ORDER — IPRATROPIUM-ALBUTEROL 0.5-2.5 (3) MG/3ML IN SOLN
3.0000 mL | Freq: Once | RESPIRATORY_TRACT | Status: AC
  Administered 2016-10-21: 3 mL via RESPIRATORY_TRACT
  Filled 2016-10-21: qty 3

## 2016-10-21 MED ORDER — SUMATRIPTAN SUCCINATE 50 MG PO TABS
100.0000 mg | ORAL_TABLET | Freq: Every day | ORAL | Status: DC | PRN
  Filled 2016-10-21: qty 2

## 2016-10-21 MED ORDER — IPRATROPIUM-ALBUTEROL 0.5-2.5 (3) MG/3ML IN SOLN
3.0000 mL | Freq: Three times a day (TID) | RESPIRATORY_TRACT | Status: DC
  Administered 2016-10-21: 3 mL via RESPIRATORY_TRACT
  Filled 2016-10-21: qty 3

## 2016-10-21 MED ORDER — SODIUM CHLORIDE 0.9 % IV SOLN
1.5000 g | Freq: Four times a day (QID) | INTRAVENOUS | Status: DC
  Administered 2016-10-21 – 2016-10-23 (×8): 1.5 g via INTRAVENOUS
  Filled 2016-10-21 (×9): qty 1.5

## 2016-10-21 MED ORDER — IOPAMIDOL (ISOVUE-370) INJECTION 76%
100.0000 mL | Freq: Once | INTRAVENOUS | Status: AC | PRN
  Administered 2016-10-21: 100 mL via INTRAVENOUS

## 2016-10-21 MED ORDER — FLUTICASONE PROPIONATE 50 MCG/ACT NA SUSP
1.0000 | Freq: Every day | NASAL | Status: DC
Start: 2016-10-21 — End: 2016-10-23
  Administered 2016-10-21 – 2016-10-23 (×3): 1 via NASAL
  Filled 2016-10-21: qty 16

## 2016-10-21 MED ORDER — ENOXAPARIN SODIUM 40 MG/0.4ML ~~LOC~~ SOLN
40.0000 mg | SUBCUTANEOUS | Status: DC
  Administered 2016-10-21: 40 mg via SUBCUTANEOUS
  Filled 2016-10-21: qty 0.4

## 2016-10-21 MED ORDER — IOPAMIDOL (ISOVUE-370) INJECTION 76%
INTRAVENOUS | Status: AC
  Filled 2016-10-21: qty 100

## 2016-10-21 MED ORDER — METHYLPREDNISOLONE SODIUM SUCC 125 MG IJ SOLR
60.0000 mg | INTRAMUSCULAR | Status: DC
  Administered 2016-10-21: 60 mg via INTRAVENOUS
  Filled 2016-10-21: qty 2

## 2016-10-21 MED ORDER — GUAIFENESIN ER 600 MG PO TB12
600.0000 mg | ORAL_TABLET | Freq: Two times a day (BID) | ORAL | Status: DC
  Administered 2016-10-21 – 2016-10-23 (×4): 600 mg via ORAL
  Filled 2016-10-21 (×4): qty 1

## 2016-10-21 MED ORDER — DEXTROSE 5 % IV SOLN
1.0000 g | Freq: Once | INTRAVENOUS | Status: AC
  Administered 2016-10-21: 1 g via INTRAVENOUS
  Filled 2016-10-21: qty 10

## 2016-10-21 MED ORDER — DEXTROSE 5 % IV SOLN
500.0000 mg | Freq: Once | INTRAVENOUS | Status: AC
  Administered 2016-10-21: 500 mg via INTRAVENOUS
  Filled 2016-10-21: qty 500

## 2016-10-21 MED ORDER — ACETAMINOPHEN 500 MG PO TABS: 1000.0000 mg | ORAL_TABLET | Freq: Four times a day (QID) | ORAL | Status: DC | PRN

## 2016-10-21 MED ORDER — ALBUTEROL SULFATE (2.5 MG/3ML) 0.083% IN NEBU: 3.0000 mL | INHALATION_SOLUTION | RESPIRATORY_TRACT | Status: DC | PRN

## 2016-10-21 MED ORDER — SODIUM CHLORIDE 0.9 % IV SOLN
INTRAVENOUS | Status: AC
  Administered 2016-10-21 – 2016-10-22 (×2): via INTRAVENOUS

## 2016-10-21 MED ORDER — NILOTINIB HCL 150 MG PO CAPS: 300.0000 mg | ORAL_CAPSULE | Freq: Two times a day (BID) | ORAL | Status: DC

## 2016-10-21 MED ORDER — LORATADINE 10 MG PO TABS
10.0000 mg | ORAL_TABLET | Freq: Every day | ORAL | Status: DC
  Administered 2016-10-22 – 2016-10-23 (×2): 10 mg via ORAL
  Filled 2016-10-21 (×2): qty 1

## 2016-10-21 MED ORDER — BENZONATATE 100 MG PO CAPS
100.0000 mg | ORAL_CAPSULE | Freq: Every day | ORAL | Status: DC
  Administered 2016-10-22 – 2016-10-23 (×2): 100 mg via ORAL
  Filled 2016-10-21 (×2): qty 1

## 2016-10-21 MED ORDER — POTASSIUM CHLORIDE CRYS ER 20 MEQ PO TBCR
40.0000 meq | EXTENDED_RELEASE_TABLET | Freq: Once | ORAL | Status: AC
  Administered 2016-10-21: 40 meq via ORAL
  Filled 2016-10-21: qty 2

## 2016-10-21 MED ORDER — IPRATROPIUM-ALBUTEROL 0.5-2.5 (3) MG/3ML IN SOLN
3.0000 mL | Freq: Two times a day (BID) | RESPIRATORY_TRACT | Status: DC
  Administered 2016-10-22 – 2016-10-23 (×3): 3 mL via RESPIRATORY_TRACT
  Filled 2016-10-21 (×3): qty 3

## 2016-10-21 MED ORDER — METHYLPREDNISOLONE SODIUM SUCC 125 MG IJ SOLR
125.0000 mg | Freq: Once | INTRAMUSCULAR | Status: AC
  Administered 2016-10-21: 125 mg via INTRAVENOUS
  Filled 2016-10-21: qty 2

## 2016-10-21 NOTE — H&P (Addendum)
History and Physical  Benjamyn Kerr [id number] DOB: 08-Jun-1985 DOA: 10/21/2016  Referring physician: EDP PCP: Wardell Honour, MD    I have personally reviewed oncology office note and [facility] records   Chief Complaint: sob/cough/wheezing  HPI: Nathan Kerr is a 31 y.o. male   H/o childhood asthma, h/o CML on target therapy with nilotinib presented to [facility] ED c/o cough, wheezing, sob.  Patient report  he also had an episode of epistaxis required cauterization by ENT in April, after that his nose started to swell and tender, he was treated with a course of abx with amoxicilin and dedrol dose pack the nasal swelling is better, but he started to have scratch cough a months ago with continued nasal congestion, he assumes cough is from postnasal drainage.  He was given another 7 days of steroids recently due to above symptom, he finished last dose of prednisone yesterday, however, He continue to cough, he feels progressive sob and started to wheeze last night, he presented to urgent care this am, with a negative cxr, he is sent to ED for further eval. He denies fever, no chest pain, no rash, cough is nonproductive. 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   ED course: vital stable, he is on room air, he did have wheezing on presentation, improved with nebs/iv steroids. cta chest  showed " 1.Left lower lobe bronchial obstruction and air trapping. The narrowing has a masslike appearance with circumferential luminal soft tissue density. Recommend pulmonary referral for bronchoscopic correlation. 2. Debris layers within the right bronchus intermedius. No generalized airway thickening. 3. Negative for pulmonary embolism. " Cbc with leukocytosis wbc 27.3 with left shift, thrombocytopenia, plt 89, hgb 13.5, bmp showed hypokalemia, otherwise unremarkable. ua unremarkable, he is given rocephin/zitrho, EDP called oncology and pulmonology, hospitalist call to admit the patient.   Review of  Systems:  Detail per HPI, Review of systems are otherwise negative  Past Medical History:  Diagnosis Date  . Allergy   . Asthma   . 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 CML (chronic myelocytic leukemia) ([place])   . Migraine    Past Surgical History:  Procedure Laterality Date  . APPENDECTOMY     Social History:  reports that he has never smoked. He has never used smokeless tobacco. He reports that he drinks alcohol. He reports that he does not use drugs. Patient lives at home & is able to participate in activities of daily living independently   No Known Allergies  Family History  Problem Relation Age of Onset  . Sleep apnea Mother   . Migraines Father       Prior to Admission medications   Medication Sig Start Date End Date Taking? Authorizing Provider  acetaminophen (TYLENOL) 500 MG tablet Take 1,000 mg by mouth every 6 (six) hours as needed for mild pain.   Yes [provider]  benzonatate (TESSALON) 100 MG capsule Take 100 mg by mouth daily. 10/13/16  Yes [provider]  cetirizine (ZYRTEC) 10 MG tablet Take 10 mg by mouth daily.   Yes [provider]  ibuprofen (ADVIL,MOTRIN) 200 MG tablet Take 400-600 mg by mouth every 6 (six) hours as needed.   Yes [provider]  meloxicam (MOBIC) 15 MG tablet Take 1 tablet (15 mg total) by mouth daily. PATIENT NEEDS OFFICE VISIT FOR ADDITIONAL REFILLS 07/05/16  Yes Wardell Honour, MD  nilotinib (TASIGNA) 150 MG capsule Take 2 capsules (300 mg total) by mouth  every 12 (twelve) hours. 05/27/15  Yes Wyatt Portela, MD  oxyCODONE-acetaminophen (PERCOCET) 10-325 MG tablet Take 1 tablet by mouth every 6 (six) hours as needed for pain. 07/05/16  Yes Wardell Honour, MD  PROAIR HFA 108 [phone] Base) MCG/ACT inhaler Inhale 2 puffs into the lungs every 4 (four) hours as needed for wheezing. 10/13/16  Yes [provider]  SUMAtriptan (IMITREX) 100 MG tablet Take 1 tablet at onset of headache. May  repeat in 2 hrs if needed, MAX 2 tabs/24 hrs. 12/14/14  Yes Wardell Honour, MD  rizatriptan (MAXALT-MLT) 10 MG disintegrating tablet Take 1 tablet (10 mg total) by mouth as needed for migraine. Repeat in 2 hours if needed. Patient not taking: Reported on 10/21/2016 07/05/16   Wardell Honour, MD    Physical Exam: BP 131/85 (BP Location: Left Arm)   Pulse 92   Temp 98.8 F (37.1 C) (Oral)   Resp 18   Ht 5' 10"  (1.778 m)   Wt 93 kg (205 lb)   SpO2 100%   BMI 29.41 kg/m   General:  NAD Eyes: PERRL ENT: unremarkable Neck: supple, no JVD Cardiovascular: RRR Respiratory: CTABL Abdomen: soft/ND/ND, positive bowel sounds Skin: no rash Musculoskeletal:  No edema Psychiatric: calm/cooperative Neurologic: no focal findings            Labs on Admission:  Basic Metabolic Panel:  Recent Labs Lab 10/21/16 1217  NA 140  K 2.9*  CL 104  CO2 27  GLUCOSE 82  BUN 16  CREATININE 1.15  CALCIUM 8.8*   Liver Function Tests: No results for input(s): AST, ALT, ALKPHOS, BILITOT, PROT, ALBUMIN in the last 168 hours. No results for input(s): LIPASE, AMYLASE in the last 168 hours. No results for input(s): AMMONIA in the last 168 hours. CBC:  Recent Labs Lab 10/21/16 1217  WBC 27.6*  NEUTROABS 21.5*  HGB 13.5  HCT 41.7  MCV 73.7*  PLT 89*   Cardiac Enzymes: No results for input(s): CKTOTAL, CKMB, CKMBINDEX, TROPONINI in the last 168 hours.  BNP (last 3 results) No results for input(s): BNP in the last 8760 hours.  ProBNP (last 3 results) No results for input(s): PROBNP in the last 8760 hours.  CBG: No results for input(s): GLUCAP in the last 168 hours.  Radiological Exams on Admission: Ct Angio Chest Pe W Or Wo Contrast  Result Date: 10/21/2016 CLINICAL DATA:  Worsening shortness of breath. Nonproductive cough and nasal swelling. History of leukemia. EXAM: CT ANGIOGRAPHY CHEST WITH CONTRAST TECHNIQUE: Multidetector CT imaging of the chest was performed using the standard  protocol during bolus administration of intravenous contrast. Multiplanar CT image reconstructions and MIPs were obtained to evaluate the vascular anatomy. CONTRAST:  100 cc Isovue 370 intravenous COMPARISON:  None. FINDINGS: Cardiovascular: Satisfactory opacification of the pulmonary arteries to the segmental level. No evidence of pulmonary embolism when allowing for intermittent motion and streak artifact. Normal heart size. No pericardial effusion. Mediastinum/Nodes: Negative for adenopathy. Lungs/Pleura: Left lower lobe bronchial obstruction at the lobar level, with tiny channel for of the superior segmental airways. This has a focal mass like appearance with circumferential luminal soft tissue density. The left lower lobe is hyperinflated. There appears to be a fluid level within the right bronchus intermedius. No generalized airway thickening. No bronchiectasis. Negative for pneumonia or edema. No effusion or pneumothorax Upper Abdomen: Partial visualization of the spleen with significant decrease size from 2010. Subcentimeter cystic density in the upper posterior right liver. Musculoskeletal: No acute or  aggressive finding Review of the MIP images confirms the above findings. IMPRESSION: 1. Left lower lobe bronchial obstruction and air trapping. The narrowing has a masslike appearance with circumferential luminal soft tissue density. Recommend pulmonary referral for bronchoscopic correlation. 2. Debris layers within the right bronchus intermedius. No generalized airway thickening. 3. Negative for pulmonary embolism. Electronically Signed   By: Monte Fantasia M.D.   On: 10/21/2016 13:55      Assessment/Plan Present on Admission: **None**    nonproductive Cough/wheezing/progressive sob  --Failed outpatient treatment with abx and steroids --No hypoxia, no PE --CT showed Left lower lobe bronchial obstruction and air trapping --Will check urine strep pneumo/ legionella antigen --Will switch abx to  unasyn, start iv solumedrol, scheduled nebs, mucinex --pulm consulted by EDP  Leukocytosis/thrombocytopenia:  From infection? Stress? From recent steroids use?  Also has left shift, elevated basiphils, CML progression? Hydration, check wbc alk phos, get blood smear, continue nilotinib,  EDP called  oncology    Hypokalemia: replace k, check mag   DVT prophylaxis: lovenox   Consultants:  EDP called oncology EDP called pulmonology  Code Status: full   Family Communication:  Patient and wife at bedside  Disposition Plan: med surg observation status  Time spent: 22mns,   Natalynn Pedone MD, PhD [facility] Pager [phone] 7PM-7AM, please contact night-coverage at [email or web address], password [facility]

## 2016-10-21 NOTE — ED Triage Notes (Addendum)
Pt complaint of worsening SOB, non productive cough, and nasal swelling for past 1.5 weeks; given prednisone for sinusitis/URI which helped but last dose was yesterday; pt takes oral chemo for leukemia. Send from [facility]; DG there negative; sent for further r/o of pulmonary issue.

## 2016-10-21 NOTE — ED Provider Notes (Addendum)
Nathan Kerr Provider Note   CSN: [id number] Arrival date & time: 10/21/16  1125     History   Chief Complaint Chief Complaint  Patient presents with  . Shortness of Breath    HPI Nathan Kerr is a 31 y.o. male.  Pt presents to the ED today with sob and cough.  Pt's sx started with sinus congestion and swelling.  He has been to ENT who put him on prednisone and abx.  Prednisone helps, but when he stops it, sx come back.  The pt said they did not put him on nasal steroids b/c they caused major nose bleeds.  Pt went to urgent care about a week ago and was put on prednisone and abx.  Sx improved while on prednisone, but they got bad again last night after he finished his prednisone.  He took his albuterol inhaler last night without help.  He initially went back to the urgent care, but they sent him here.  Pt is in remission for CML, but takes oral chemo (Tasigna) twice a day to keep himself in remission.  He is followed by Dr. Alen Blew (oncology) for his CML.      Past Medical History:  Diagnosis Date  . Allergy   . Asthma   . 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 CML (chronic myelocytic leukemia) ([place])   . Migraine     Patient Active Problem List   Diagnosis Date Noted  . Endobronchial mass 10/21/2016  . Class 1 obesity due to excess calories without serious comorbidity with body mass index (BMI) of 32.0 to 32.9 in adult 07/17/2016  . Allergic rhinitis 10/16/2013  . Migraine 03/12/2013  . CML (chronic myelocytic leukemia) ([place]) 10/27/2011    Past Surgical History:  Procedure Laterality Date  . APPENDECTOMY         Home Medications    Prior to Admission medications   Medication Sig Start Date End Date Taking? Authorizing Provider  acetaminophen (TYLENOL) 500 MG tablet Take 1,000 mg by mouth every 6 (six) hours as needed for mild pain.   Yes [provider]  benzonatate (TESSALON) 100 MG capsule Take 100 mg by mouth daily. 10/13/16  Yes  [provider]  cetirizine (ZYRTEC) 10 MG tablet Take 10 mg by mouth daily.   Yes [provider]  ibuprofen (ADVIL,MOTRIN) 200 MG tablet Take 400-600 mg by mouth every 6 (six) hours as needed.   Yes [provider]  meloxicam (MOBIC) 15 MG tablet Take 1 tablet (15 mg total) by mouth daily. PATIENT NEEDS OFFICE VISIT FOR ADDITIONAL REFILLS 07/05/16  Yes Wardell Honour, MD  nilotinib (TASIGNA) 150 MG capsule Take 2 capsules (300 mg total) by mouth every 12 (twelve) hours. 05/27/15  Yes Wyatt Portela, MD  oxyCODONE-acetaminophen (PERCOCET) 10-325 MG tablet Take 1 tablet by mouth every 6 (six) hours as needed for pain. 07/05/16  Yes Wardell Honour, MD  PROAIR HFA 108 [phone] Base) MCG/ACT inhaler Inhale 2 puffs into the lungs every 4 (four) hours as needed for wheezing. 10/13/16  Yes [provider]  SUMAtriptan (IMITREX) 100 MG tablet Take 1 tablet at onset of headache. May repeat in 2 hrs if needed, MAX 2 tabs/24 hrs. 12/14/14  Yes Wardell Honour, MD  rizatriptan (MAXALT-MLT) 10 MG disintegrating tablet Take 1 tablet (10 mg total) by mouth as needed for migraine. Repeat in 2 hours if needed. Patient not taking: Reported on 10/21/2016 07/05/16   Wardell Honour, MD  Family History Family History  Problem Relation Age of Onset  . Sleep apnea Mother   . Migraines Father     Social History Social History  Substance Use Topics  . Smoking status: Never Smoker  . Smokeless tobacco: Never Used  . Alcohol use Yes     Comment: occasional     Allergies   Patient has no known allergies.   Review of Systems Review of Systems  HENT: Positive for sinus pressure and sneezing.   Respiratory: Positive for shortness of breath.   All other systems reviewed and are negative.    Physical Exam Updated Vital Signs BP 127/90 (BP Location: Left Arm)   Pulse 81   Temp 98.8 F (37.1 C) (Oral)   Resp 16   Ht 5\' 10"  (1.778 m)   Wt 93 kg (205 lb)   SpO2 99%   BMI  29.41 kg/m   Physical Exam  Constitutional: He is oriented to person, place, and time. He appears well-developed and well-nourished.  HENT:  Head: Normocephalic and atraumatic.  Right Ear: External ear normal.  Left Ear: External ear normal.  Nose: Mucosal edema present.  Mouth/Throat: Oropharynx is clear and moist.  Eyes: Conjunctivae and EOM are normal. Pupils are equal, round, and reactive to light.  Neck: Normal range of motion. Neck supple.  Cardiovascular: Normal rate, regular rhythm, normal heart sounds and intact distal pulses.   Pulmonary/Chest: He has wheezes.  Abdominal: Soft. Bowel sounds are normal.  Musculoskeletal: Normal range of motion.  Neurological: He is alert and oriented to person, place, and time.  Skin: Skin is warm.  Psychiatric: He has a normal mood and affect. His behavior is normal. Judgment and thought content normal.  Nursing note and vitals reviewed.    ED Treatments / Results  Labs (all labs ordered are listed, but only abnormal results are displayed) Labs Reviewed  BASIC METABOLIC PANEL - Abnormal; Notable for the following:       Result Value   Potassium 2.9 (*)    Calcium 8.8 (*)    All other components within normal limits  CBC WITH DIFFERENTIAL/PLATELET - Abnormal; Notable for the following:    WBC 27.6 (*)    MCV 73.7 (*)    MCH 23.9 (*)    RDW 18.2 (*)    Platelets 89 (*)    Neutro Abs 21.5 (*)    Monocytes Absolute 1.9 (*)    Basophils Absolute 0.3 (*)    All other components within normal limits  CULTURE, BLOOD (ROUTINE X 2)  CULTURE, BLOOD (ROUTINE X 2)  URINALYSIS, ROUTINE W REFLEX MICROSCOPIC    EKG  EKG Interpretation None       Radiology Ct Angio Chest Pe W Or Wo Contrast  Result Date: 10/21/2016 CLINICAL DATA:  Worsening shortness of breath. Nonproductive cough and nasal swelling. History of leukemia. EXAM: CT ANGIOGRAPHY CHEST WITH CONTRAST TECHNIQUE: Multidetector CT imaging of the chest was performed using the  standard protocol during bolus administration of intravenous contrast. Multiplanar CT image reconstructions and MIPs were obtained to evaluate the vascular anatomy. CONTRAST:  100 cc Isovue 370 intravenous COMPARISON:  None. FINDINGS: Cardiovascular: Satisfactory opacification of the pulmonary arteries to the segmental level. No evidence of pulmonary embolism when allowing for intermittent motion and streak artifact. Normal heart size. No pericardial effusion. Mediastinum/Nodes: Negative for adenopathy. Lungs/Pleura: Left lower lobe bronchial obstruction at the lobar level, with tiny channel for of the superior segmental airways. This has a focal mass like appearance  with circumferential luminal soft tissue density. The left lower lobe is hyperinflated. There appears to be a fluid level within the right bronchus intermedius. No generalized airway thickening. No bronchiectasis. Negative for pneumonia or edema. No effusion or pneumothorax Upper Abdomen: Partial visualization of the spleen with significant decrease size from 2010. Subcentimeter cystic density in the upper posterior right liver. Musculoskeletal: No acute or aggressive finding Review of the MIP images confirms the above findings. IMPRESSION: 1. Left lower lobe bronchial obstruction and air trapping. The narrowing has a masslike appearance with circumferential luminal soft tissue density. Recommend pulmonary referral for bronchoscopic correlation. 2. Debris layers within the right bronchus intermedius. No generalized airway thickening. 3. Negative for pulmonary embolism. Electronically Signed   By: Monte Fantasia M.D.   On: 10/21/2016 13:55    Procedures Procedures (including critical care time)  Medications Ordered in ED Medications  iopamidol (ISOVUE-370) 76 % injection (not administered)  azithromycin (ZITHROMAX) 500 mg in dextrose 5 % 250 mL IVPB (500 mg Intravenous New Bag/Given 10/21/16 1612)  sodium chloride 0.9 % bolus 1,000 mL (0 mLs  Intravenous Stopped 10/21/16 1601)  methylPREDNISolone sodium succinate (SOLU-MEDROL) 125 mg/2 mL injection 125 mg (125 mg Intravenous Given 10/21/16 1215)  ipratropium-albuterol (DUONEB) 0.5-2.5 (3) MG/3ML nebulizer solution 3 mL (3 mLs Nebulization Given 10/21/16 1216)  iopamidol (ISOVUE-370) 76 % injection 100 mL (100 mLs Intravenous Contrast Given 10/21/16 1251)  cefTRIAXone (ROCEPHIN) 1 g in dextrose 5 % 50 mL IVPB (1 g Intravenous New Bag/Given 10/21/16 1612)  potassium chloride SA (K-DUR,KLOR-CON) CR tablet 40 mEq (40 mEq Oral Given 10/21/16 1612)     Initial Impression / Assessment and Plan / ED Course  I have reviewed the triage vital signs and the nursing notes.  Pertinent labs & imaging results that were available during my care of the patient were reviewed by me and considered in my medical decision making (see chart for details).    Breathing has improved with nebs and steroids.  Pt given IV abx for possible post obstructive pna.  I did discuss pt with Dr. Alen Blew (oncology) who does not think the cbc abn are cml recurrence.  He will follow as an outpatient.    Pt d/w Dr. Erlinda Hong (triad) who will admit pt.  She asked me to speak to pulmonology.  The pt was d/w Dr. Oletta Darter (pulmonology) who said someone would see pt in the morning.  Final Clinical Impressions(s) / ED Diagnoses   Final diagnoses:  Failure of outpatient treatment  Post-obstructive pneumonia due to foreign body aspiration  Thrombocytopenia ([place])  Leukocytosis, unspecified type  Hypokalemia  Abnormal chest CT    New Prescriptions New Prescriptions   No medications on file     Isla Pence, MD 10/21/16 1652    Isla Pence, MD 10/21/16 1657

## 2016-10-22 DIAGNOSIS — J9809 Other diseases of bronchus, not elsewhere classified: Secondary | ICD-10-CM | POA: Diagnosis not present

## 2016-10-22 DIAGNOSIS — C9211 Chronic myeloid leukemia, BCR/ABL-positive, in remission: Secondary | ICD-10-CM

## 2016-10-22 DIAGNOSIS — Z8669 Personal history of other diseases of the nervous system and sense organs: Secondary | ICD-10-CM | POA: Diagnosis not present

## 2016-10-22 DIAGNOSIS — J45901 Unspecified asthma with (acute) exacerbation: Secondary | ICD-10-CM | POA: Diagnosis present

## 2016-10-22 DIAGNOSIS — E876 Hypokalemia: Secondary | ICD-10-CM | POA: Diagnosis present

## 2016-10-22 DIAGNOSIS — R918 Other nonspecific abnormal finding of lung field: Secondary | ICD-10-CM

## 2016-10-22 DIAGNOSIS — J301 Allergic rhinitis due to pollen: Secondary | ICD-10-CM | POA: Diagnosis not present

## 2016-10-22 DIAGNOSIS — J310 Chronic rhinitis: Secondary | ICD-10-CM | POA: Diagnosis present

## 2016-10-22 DIAGNOSIS — C9210 Chronic myeloid leukemia, BCR/ABL-positive, not having achieved remission: Secondary | ICD-10-CM | POA: Diagnosis not present

## 2016-10-22 DIAGNOSIS — G43909 Migraine, unspecified, not intractable, without status migrainosus: Secondary | ICD-10-CM | POA: Diagnosis present

## 2016-10-22 DIAGNOSIS — Z9889 Other specified postprocedural states: Secondary | ICD-10-CM | POA: Diagnosis not present

## 2016-10-22 DIAGNOSIS — D696 Thrombocytopenia, unspecified: Secondary | ICD-10-CM | POA: Diagnosis present

## 2016-10-22 DIAGNOSIS — R0602 Shortness of breath: Secondary | ICD-10-CM | POA: Diagnosis present

## 2016-10-22 DIAGNOSIS — R938 Abnormal findings on diagnostic imaging of other specified body structures: Secondary | ICD-10-CM | POA: Diagnosis not present

## 2016-10-22 LAB — COMPREHENSIVE METABOLIC PANEL
ALBUMIN: 3.5 g/dL (ref 3.5–5.0)
ALT: 69 U/L — AB (ref 17–63)
AST: 17 U/L (ref 15–41)
Alkaline Phosphatase: 77 U/L (ref 38–126)
Anion gap: 9 (ref 5–15)
BUN: 16 mg/dL (ref 6–20)
CHLORIDE: 103 mmol/L (ref 101–111)
CO2: 27 mmol/L (ref 22–32)
CREATININE: 0.97 mg/dL (ref 0.61–1.24)
Calcium: 9 mg/dL (ref 8.9–10.3)
GFR calc Af Amer: >60 mL/min (ref >60)
GLUCOSE: 134 mg/dL — AB (ref 65–99)
POTASSIUM: 4.6 mmol/L (ref 3.5–5.1)
Sodium: 139 mmol/L (ref 135–145)
Total Bilirubin: 0.4 mg/dL (ref 0.3–1.2)
Total Protein: 7 g/dL (ref 6.5–8.1)

## 2016-10-22 LAB — CBC WITH DIFFERENTIAL/PLATELET
BAND NEUTROPHILS: 7 %
BASOS PCT: 0 %
Basophils Absolute: 0 10*3/uL (ref 0.0–0.1)
EOS PCT: 0 %
Eosinophils Absolute: 0 10*3/uL (ref 0.0–0.7)
HCT: 39 % (ref 39.0–52.0)
HEMOGLOBIN: 12.9 g/dL — AB (ref 13.0–17.0)
LYMPHS ABS: 2 10*3/uL (ref 0.7–4.0)
LYMPHS PCT: 7 %
MCH: 24.2 pg — AB (ref 26.0–34.0)
MCHC: 33.1 g/dL (ref 30.0–36.0)
MCV: 73.3 fL — ABNORMAL LOW (ref 78.0–100.0)
MONO ABS: 0.3 10*3/uL (ref 0.1–1.0)
MONOS PCT: 1 %
Metamyelocytes Relative: 3 %
Myelocytes: 9 %
NEUTROS PCT: 72 %
Neutro Abs: 25.8 10*3/uL — ABNORMAL HIGH (ref 1.7–7.7)
PLATELETS: 111 10*3/uL — AB (ref 150–400)
Promyelocytes Absolute: 1 %
RBC: 5.32 MIL/uL (ref 4.22–5.81)
RDW: 18.2 % — ABNORMAL HIGH (ref 11.5–15.5)
WBC: 28.1 10*3/uL — ABNORMAL HIGH (ref 4.0–10.5)

## 2016-10-22 LAB — STREP PNEUMONIAE URINARY ANTIGEN: STREP PNEUMO URINARY ANTIGEN: NEGATIVE

## 2016-10-22 LAB — SAVE SMEAR

## 2016-10-22 LAB — MAGNESIUM: Magnesium: 2.5 mg/dL — ABNORMAL HIGH (ref 1.7–2.4)

## 2016-10-22 LAB — HIV ANTIBODY (ROUTINE TESTING W REFLEX): HIV SCREEN 4TH GENERATION: NONREACTIVE

## 2016-10-22 LAB — PROTIME-INR
INR: 1.05
PROTHROMBIN TIME: 13.8 s (ref 11.4–15.2)

## 2016-10-22 MED ORDER — FLUTICASONE FUROATE-VILANTEROL 100-25 MCG/INH IN AEPB
1.0000 | INHALATION_SPRAY | Freq: Every day | RESPIRATORY_TRACT | Status: DC
  Administered 2016-10-23: 08:00:00 1 via RESPIRATORY_TRACT
  Filled 2016-10-22: qty 28

## 2016-10-22 MED ORDER — MONTELUKAST SODIUM 10 MG PO TABS
10.0000 mg | ORAL_TABLET | Freq: Every day | ORAL | Status: DC
  Administered 2016-10-22: 10 mg via ORAL
  Filled 2016-10-22: qty 1

## 2016-10-22 MED ORDER — MONTELUKAST SODIUM 10 MG PO TABS: 10.0000 mg | ORAL_TABLET | Freq: Every day | ORAL | Status: DC

## 2016-10-22 MED ORDER — PREDNISONE 20 MG PO TABS
60.0000 mg | ORAL_TABLET | Freq: Every day | ORAL | Status: DC
  Administered 2016-10-22 – 2016-10-23 (×2): 60 mg via ORAL
  Filled 2016-10-22 (×2): qty 3

## 2016-10-22 NOTE — Consult Note (Signed)
PULMONARY / CRITICAL CARE MEDICINE   Name: Nathan Kerr MRN: [id number] DOB: 23-Jun-1985    ADMISSION DATE:  10/21/2016 CONSULTATION DATE:  10/22/2016  REFERRING MD:  Dr. Wynelle Cleveland  CHIEF COMPLAINT:  Shortness of breath and cough  HISTORY OF PRESENT ILLNESS:   31 year old male with a past medical history significant for chronic myelocytic anemia in remission presented to our facility with cough and shortness of breath yesterday. He was found in the emergency department to have left lower lung airway narrowing so pulmonary and critical care medicine was consulted for a bronchoscopy.  He has wife provide the history jointly. They state that he's been having sinus pain and congestion off and on with intermittent epistaxis for several years. The been followed by Dr. Redmond Baseman for this. He's had to have cauterization of bleeding vessels in his sinuses in the past. He's also suffered with recurrent pain and congestion. In the last 3-4 months his sinus symptoms have been much worse. He underwent a sinus cauterization about 2 months ago. Since then has been having increasing pain and pressure requiring antibiotics and prednisone. He says when he takes prednisone this improves. After a few weeks of the sinus symptoms he developed a tickle in his throat and then eventually a cough. He was treated about a week ago with albuterol for wheezing, prednisone and an antibiotic. This helped for a while but once the prednisone stopped his symptoms worsened so he came to the emergency room. They note that only once has he ever produce mucus from his chest and one time only from the sinuses. He denies fevers or chills. He feels well today.  PAST MEDICAL HISTORY :  He  has a past medical history of Allergy; Asthma; Chronic myeloid leukemia in remission ([place]); CML (chronic myelocytic leukemia) ([place]); and Migraine.  PAST SURGICAL HISTORY: He  has a past surgical history that includes Appendectomy.  No Known Allergies  No  current facility-administered medications on file prior to encounter.    Current Outpatient Prescriptions on File Prior to Encounter  Medication Sig  . meloxicam (MOBIC) 15 MG tablet Take 1 tablet (15 mg total) by mouth daily. PATIENT NEEDS OFFICE VISIT FOR ADDITIONAL REFILLS  . nilotinib (TASIGNA) 150 MG capsule Take 2 capsules (300 mg total) by mouth every 12 (twelve) hours.  [name] oxyCODONE-acetaminophen (PERCOCET) 10-325 MG tablet Take 1 tablet by mouth every 6 (six) hours as needed for pain.  . SUMAtriptan (IMITREX) 100 MG tablet Take 1 tablet at onset of headache. May repeat in 2 hrs if needed, MAX 2 tabs/24 hrs.  . rizatriptan (MAXALT-MLT) 10 MG disintegrating tablet Take 1 tablet (10 mg total) by mouth as needed for migraine. Repeat in 2 hours if needed. (Patient not taking: Reported on 10/21/2016)    FAMILY HISTORY:  His indicated that his mother is alive. He indicated that his father is alive. He indicated that his sister is alive. He indicated that his brother is alive. He indicated that his maternal grandmother is alive. He indicated that his maternal grandfather is deceased. He indicated that his paternal grandmother is deceased. He indicated that his paternal grandfather is deceased.    SOCIAL HISTORY: He  reports that he has never smoked. He has never used smokeless tobacco. He reports that he drinks alcohol. He reports that he does not use drugs.  REVIEW OF SYSTEMS:   Gen: Denies fever, chills, weight change, fatigue, night sweats HEENT: as above PULM: per HPI CV: Denies chest pain, edema, orthopnea, paroxysmal nocturnal dyspnea,  palpitations GI: Denies abdominal pain, nausea, vomiting, diarrhea, hematochezia, melena, constipation, change in bowel habits GU: Denies dysuria, hematuria, polyuria, oliguria, urethral discharge Endocrine: Denies hot or cold intolerance, polyuria, polyphagia or appetite change Derm: Denies rash, dry skin, scaling or peeling skin change Heme: Denies  easy bruising, bleeding, bleeding gums Neuro: Denies headache, numbness, weakness, slurred speech, loss of memory or consciousness   SUBJECTIVE:  As above  VITAL SIGNS: BP 120/74 (BP Location: Right Arm)   Pulse 90   Temp 97.9 F (36.6 C) (Oral)   Resp 16   Ht 5\' 10"  (1.778 m)   Wt 205 lb (93 kg)   SpO2 98%   BMI 29.41 kg/m   HEMODYNAMICS:    VENTILATOR SETTINGS:    INTAKE / OUTPUT: I/O last 3 completed shifts: In: 1480 [P.O.:480; IV Piggyback:1000] Out: -   PHYSICAL EXAMINATION: Gen: well appearing, no acute distress HENT: NCAT, OP clear, neck supple without masses Eyes: PERRL, EOMi Lymph: no cervical lymphadenopathy PULM: CTA B CV: RRR, no mgr, no JVD GI: BS+, soft, nontender, no hsm Derm: no rash or skin breakdown MSK: normal bulk and tone Neuro: A&Ox4, CN II-XII intact, strength 5/5 in all 4 extremities Psyche: normal mood and affect   LABS:  BMET  Recent Labs Lab 10/21/16 1217 10/22/16 0403  NA 140 139  K 2.9* 4.6  CL 104 103  CO2 27 27  BUN 16 16  CREATININE 1.15 0.97  GLUCOSE 82 134*    Electrolytes  Recent Labs Lab 10/21/16 1217 10/22/16 0403  CALCIUM 8.8* 9.0  MG  --  2.5*    CBC  Recent Labs Lab 10/21/16 1217 10/22/16 0403  WBC 27.6* 28.1*  HGB 13.5 12.9*  HCT 41.7 39.0  PLT 89* 111*    Coag's  Recent Labs Lab 10/22/16 0403  INR 1.05    Sepsis Markers No results for input(s): LATICACIDVEN, PROCALCITON, O2SATVEN in the last 168 hours.  ABG No results for input(s): PHART, PCO2ART, PO2ART in the last 168 hours.  Liver Enzymes  Recent Labs Lab 10/22/16 0403  AST 17  ALT 69*  ALKPHOS 77  BILITOT 0.4  ALBUMIN 3.5    Cardiac Enzymes No results for input(s): TROPONINI, PROBNP in the last 168 hours.  Glucose No results for input(s): GLUCAP in the last 168 hours.  Imaging Ct Angio Chest Pe W Or Wo Contrast  Result Date: 10/21/2016 CLINICAL DATA:  Worsening shortness of breath. Nonproductive cough and  nasal swelling. History of leukemia. EXAM: CT ANGIOGRAPHY CHEST WITH CONTRAST TECHNIQUE: Multidetector CT imaging of the chest was performed using the standard protocol during bolus administration of intravenous contrast. Multiplanar CT image reconstructions and MIPs were obtained to evaluate the vascular anatomy. CONTRAST:  100 cc Isovue 370 intravenous COMPARISON:  None. FINDINGS: Cardiovascular: Satisfactory opacification of the pulmonary arteries to the segmental level. No evidence of pulmonary embolism when allowing for intermittent motion and streak artifact. Normal heart size. No pericardial effusion. Mediastinum/Nodes: Negative for adenopathy. Lungs/Pleura: Left lower lobe bronchial obstruction at the lobar level, with tiny channel for of the superior segmental airways. This has a focal mass like appearance with circumferential luminal soft tissue density. The left lower lobe is hyperinflated. There appears to be a fluid level within the right bronchus intermedius. No generalized airway thickening. No bronchiectasis. Negative for pneumonia or edema. No effusion or pneumothorax Upper Abdomen: Partial visualization of the spleen with significant decrease size from 2010. Subcentimeter cystic density in the upper posterior right liver. Musculoskeletal: No  acute or aggressive finding Review of the MIP images confirms the above findings. IMPRESSION: 1. Left lower lobe bronchial obstruction and air trapping. The narrowing has a masslike appearance with circumferential luminal soft tissue density. Recommend pulmonary referral for bronchoscopic correlation. 2. Debris layers within the right bronchus intermedius. No generalized airway thickening. 3. Negative for pulmonary embolism. Electronically Signed   By: Monte Fantasia M.D.   On: 10/21/2016 13:55     STUDIES:  June 16 CT chest images independently reviewed showing normal pulmonary parenchyma without abnormality, in the left lower lobe there is airway  narrowing which appears to be due to extrinsic compression from a soft tissue density. There is some air-trapping in the left lower lobe.  CULTURES: 10/21/2016 blood culture >   ANTIBIOTICS: 6/16 unasyn >    DISCUSSION: 31 year old male with a past medical history significant for myeloid leukemia which is in remission presented yesterday with shortness of breath and cough with leukocytosis. On chest imaging he was found somewhat incidentally to have left lower lobe is airway narrowing with a soft tissue density mass extrinsically compressing the left lower lobe. Today he's feeling fine with normal vital signs.  ASSESSMENT / PLAN:  PULMONARY A: Left lower lobe airway narrowing > uncertain cause, malignancy possible given appearance or perhaps an atypical infection given his hematologic history; consider allergic (ABPA vs less likely vasculitis) P:   Bronchoscopy 6/18 AM Keep NPO post midnight 6/18 Continue current management as you are doing  Roselie Awkward, MD [facility] Pager: [phone] Cell: [phone] After 3pm or if no response, call [phone]  10/22/2016, 11:49 AM

## 2016-10-22 NOTE — Progress Notes (Addendum)
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 He has had an extensive course of sinus issues, congestion and wheezing over the past 2 months. He had epistaxis in April and required cauterization. He subsequently developed "sinus issues" and was treated with multiple courses of antibiotics including Amoxil, Levaquin and then Clindamycin for his symptoms. He subsequently developed chest congestion [facility] Prednisone which made him feel significantly better. The day after he came off the Prednisone he felt sick once a day with cough, congestion and wheezing.   Subjective: Feels great today and wants to go home. Currently has no cough. Cannot tell if he has a post nasal drip.   Assessment & Plan:   Principal Problem:   Asthma, chronic, unspecified asthma severity, with acute exacerbation    Allergic rhinitis - suspect asthma and rhinitis as the cause of his issues over the past few weeks - have added Singulair, weaned Solumedrol to Prednisone and added a steroid inhaler - he is using Fluticasone as well but has had nose bleeds with this in the past and will need to be cautious - have asked him to follow up with an [facility]  Active Problems: Left lower lobe airway narrowing - have consulted PCCM- will have bronch tomorrow - cont Unasyn for now     CML (chronic myelocytic leukemia)  - can cont Nilotinib once acute issues resolve  Acute thrombocytopenia - follow- ? Due to underlying infection  DVT prophylaxis: SCDs Code Status: Full code Family Communication: wife at bedside Disposition Plan:  Consultants:   PCCM  Oncology Procedures:    Antimicrobials:  Anti-infectives    Start     Dose/Rate Route Frequency Ordered Stop   10/21/16 2000   ampicillin-sulbactam (UNASYN) 1.5 g in sodium chloride 0.9 % 50 mL IVPB     1.5 g 100 mL/hr over 30 Minutes Intravenous Every 6 hours 10/21/16 1832     10/21/16 1545  cefTRIAXone (ROCEPHIN) 1 g in dextrose 5 % 50 mL IVPB     1 g 100 mL/hr over 30 Minutes Intravenous  Once 10/21/16 1544 10/21/16 1742   10/21/16 1545  azithromycin (ZITHROMAX) 500 mg in dextrose 5 % 250 mL IVPB     500 mg 250 mL/hr over 60 Minutes Intravenous  Once 10/21/16 1544 10/21/16 1712       Objective: Vitals:   10/21/16 1938 10/21/16 2035 10/22/16 0543 10/22/16 0959  BP:  123/80 120/74   Pulse:  83 (!) 57 90  Resp:  18 16 16   Temp:  98.4 F (36.9 C) 97.9 F (36.6 C)   TempSrc:  Oral Oral   SpO2: 96% 97% 98% 98%  Weight:      Height:        Intake/Output Summary (Last 24 hours) at 10/22/16 1435 Last data filed at 10/22/16 1016  Gross per 24 hour  Intake             1960 ml  Output                0 ml  Net             1960 ml   Filed Weights   10/21/16 1134 10/21/16 1808  Weight: 93 kg (205 lb) 93  kg (205 lb)    Examination: General exam: Appears comfortable  HEENT: PERRLA, oral mucosa moist, no sclera icterus or thrush Respiratory system: Clear to auscultation. Respiratory effort normal. Cardiovascular system: S1 & S2 heard, RRR.  No murmurs  Gastrointestinal system: Abdomen soft, non-tender, nondistended. Normal bowel sound. No organomegaly Central nervous system: Alert and oriented. No focal neurological deficits. Extremities: No cyanosis, clubbing or edema Skin: No rashes or ulcers Psychiatry:  Mood & affect appropriate.     Data Reviewed: I have personally reviewed following labs and imaging studies  CBC:  Recent Labs Lab 10/21/16 1217 10/22/16 0403  WBC 27.6* 28.1*  NEUTROABS 21.5* 25.8*  HGB 13.5 12.9*  HCT 41.7 39.0  MCV 73.7* 73.3*  PLT 89* 856*   Basic Metabolic Panel:  Recent Labs Lab 10/21/16 1217 10/22/16 0403  NA 140 139  K 2.9* 4.6  CL 104 103  CO2 27 27    GLUCOSE 82 134*  BUN 16 16  CREATININE 1.15 0.97  CALCIUM 8.8* 9.0  MG  --  2.5*   GFR: Estimated Creatinine Clearance: 125.3 mL/min (by C-G formula based on SCr of 0.97 mg/dL). Liver Function Tests:  Recent Labs Lab 10/22/16 0403  AST 17  ALT 69*  ALKPHOS 77  BILITOT 0.4  PROT 7.0  ALBUMIN 3.5   No results for input(s): LIPASE, AMYLASE in the last 168 hours. No results for input(s): AMMONIA in the last 168 hours. Coagulation Profile:  Recent Labs Lab 10/22/16 0403  INR 1.05   Cardiac Enzymes: No results for input(s): CKTOTAL, CKMB, CKMBINDEX, TROPONINI in the last 168 hours. BNP (last 3 results) No results for input(s): PROBNP in the last 8760 hours. HbA1C: No results for input(s): HGBA1C in the last 72 hours. CBG: No results for input(s): GLUCAP in the last 168 hours. Lipid Profile: No results for input(s): CHOL, HDL, LDLCALC, TRIG, CHOLHDL, LDLDIRECT in the last 72 hours. Thyroid Function Tests: No results for input(s): TSH, T4TOTAL, FREET4, T3FREE, THYROIDAB in the last 72 hours. Anemia Panel: No results for input(s): VITAMINB12, FOLATE, FERRITIN, TIBC, IRON, RETICCTPCT in the last 72 hours. Urine analysis:    Component Value Date/Time   COLORURINE YELLOW 10/21/2016 [place] 10/21/2016 1217   LABSPEC 1.020 10/21/2016 1217   PHURINE 5.5 10/21/2016 1217   GLUCOSEU NEGATIVE 10/21/2016 1217   HGBUR NEGATIVE 10/21/2016 1217   BILIRUBINUR NEGATIVE 10/21/2016 1217   KETONESUR NEGATIVE 10/21/2016 1217   PROTEINUR NEGATIVE 10/21/2016 1217   UROBILINOGEN 0.2 08/12/2008 1818   NITRITE NEGATIVE 10/21/2016 1217   LEUKOCYTESUR NEGATIVE 10/21/2016 1217   Sepsis Labs: @LABRCNTIP (procalcitonin:4,lacticidven:4) ) Recent Results (from the past 240 hour(s))  Blood culture (routine x 2)     Status: None (Preliminary result)   Collection Time: 10/21/16  3:47 PM  Result Value Ref Range Status   Specimen Description BLOOD RIGHT HAND  Final   Special  Requests   Final    BOTTLES DRAWN AEROBIC AND ANAEROBIC Blood Culture adequate volume   Culture   Final    NO GROWTH < 24 HOURS Performed at [facility], [place] [address] [place], [place] [address]    Report Status PENDING  Incomplete  Blood culture (routine x 2)     Status: None (Preliminary result)   Collection Time: 10/21/16  3:52 PM  Result Value Ref Range Status   Specimen Description BLOOD LEFT FOREARM  Final   Special Requests   Final    BOTTLES DRAWN AEROBIC AND ANAEROBIC  Blood Culture adequate volume   Culture   Final    NO GROWTH < 24 HOURS Performed at [facility], [place] [address], [place], [place] [address]    Report Status PENDING  Incomplete  MRSA PCR Screening     Status: None   Collection Time: 10/21/16  6:33 PM  Result Value Ref Range Status   MRSA by PCR NEGATIVE NEGATIVE Final    Comment:        The GeneXpert MRSA Assay (FDA approved for NASAL specimens only), is one component of a comprehensive MRSA colonization surveillance program. It is not intended to diagnose MRSA infection nor to guide or monitor treatment for MRSA infections.          Radiology Studies: Ct Angio Chest Pe W Or Wo Contrast  Result Date: 10/21/2016 CLINICAL DATA:  Worsening shortness of breath. Nonproductive cough and nasal swelling. History of leukemia. EXAM: CT ANGIOGRAPHY CHEST WITH CONTRAST TECHNIQUE: Multidetector CT imaging of the chest was performed using the standard protocol during bolus administration of intravenous contrast. Multiplanar CT image reconstructions and MIPs were obtained to evaluate the vascular anatomy. CONTRAST:  100 cc Isovue 370 intravenous COMPARISON:  None. FINDINGS: Cardiovascular: Satisfactory opacification of the pulmonary arteries to the segmental level. No evidence of pulmonary embolism when allowing for intermittent motion and streak artifact. Normal heart size. No pericardial effusion. Mediastinum/Nodes: Negative for adenopathy.  Lungs/Pleura: Left lower lobe bronchial obstruction at the lobar level, with tiny channel for of the superior segmental airways. This has a focal mass like appearance with circumferential luminal soft tissue density. The left lower lobe is hyperinflated. There appears to be a fluid level within the right bronchus intermedius. No generalized airway thickening. No bronchiectasis. Negative for pneumonia or edema. No effusion or pneumothorax Upper Abdomen: Partial visualization of the spleen with significant decrease size from 2010. Subcentimeter cystic density in the upper posterior right liver. Musculoskeletal: No acute or aggressive finding Review of the MIP images confirms the above findings. IMPRESSION: 1. Left lower lobe bronchial obstruction and air trapping. The narrowing has a masslike appearance with circumferential luminal soft tissue density. Recommend pulmonary referral for bronchoscopic correlation. 2. Debris layers within the right bronchus intermedius. No generalized airway thickening. 3. Negative for pulmonary embolism. Electronically Signed   By: Monte Fantasia M.D.   On: 10/21/2016 13:55      Scheduled Meds: . benzonatate  100 mg Oral Daily  . enoxaparin (LOVENOX) injection  40 mg Subcutaneous Q24H  . fluticasone  1 spray Each Nare Daily  . fluticasone furoate-vilanterol  1 puff Inhalation Daily  . guaiFENesin  600 mg Oral BID  . ipratropium-albuterol  3 mL Nebulization BID  . loratadine  10 mg Oral Daily  . montelukast  10 mg Oral QHS  . predniSONE  60 mg Oral Q breakfast   Continuous Infusions: . sodium chloride 75 mL/hr at 10/22/16 1018  . ampicillin-sulbactam (UNASYN) IV Stopped (10/22/16 1423)     LOS: 0 days    Time spent in minutes: 35    Debbe Odea, MD [facility] Pager: [email or web address] Password [email or web address] 10/22/2016, 2:35 PM

## 2016-10-22 NOTE — Progress Notes (Signed)
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   Prior Therapy:  1. Patient treated with Gleevec 400 mg daily between April of 2010 until September of 2010. 2.     Patient treated with Gleevec 200 mg daily due to severe thrombocytopenia and subsequently switched to Tasigna for lack of efficacy and poor tolerance.   Current therapy: He is on Tasigna 300 mg tablet b.i.d. total 600 mg daily, started in August of 2011.  He had a complete hematological remission and near molecular remission at this time. He presented with a relapse in November of 2014 with elevated white cell count close to 300,000 range after he stopped taking his medication due to poor compliance.   He was restarted in November of 2014 had been in remission since that time.  This is a pleasant gentleman with the above diagnosis and history that has been in remission from his CML. He has been having issues with her sinusitis and subsequently developed cough, shortness of breath and presented to the emergency department on 10/21/2016. CT scan of the chest showed left lower lobe bronchial obstruction and a masslike appearance without pulmonary embolism.  He was started on intravenous and a biotics and steroids with improvement in his symptoms. His white cell count was found to be elevated at 27,000 with left shift and toxic granulation. No evidence of increased blasts. His platelet count also improved from 89-111 in last 24 hours.  Clinically he is also improving with improvement in his cough and shortness of breath. He is no longer reporting any wheezing.  Objective:  Vital signs in last 24 hours: Temp:  [97.9 F (36.6 C)-98.9 F (37.2 C)] 97.9 F (36.6 C) (06/17 0543) Pulse Rate:  [57-100] 57 (06/17 0543) Resp:  [16-18] 16 (06/17 0543) BP: (120-142)/(74-93) 120/74 (06/17 0543) SpO2:  [96 %-100 %] 98 %  (06/17 0543) Weight:  [205 lb (93 kg)] 205 lb (93 kg) (06/16 1808) Weight change:  Last BM Date: 10/20/16  Intake/Output from previous day: 06/16 0701 - 06/17 0700 In: 1480 [P.O.:480; IV Piggyback:1000] Out: -  Alert, awake gentleman appeared without distress today. Mouth: mucous membranes moist, pharynx normal without lesions Resp: clear to auscultation bilaterally without wheezes or dullness to percussion. Cardio: regular rate and rhythm, S1, S2 normal, no murmur, click, rub or gallop GI: soft, non-tender; bowel sounds normal; no masses,  no organomegaly Extremities: extremities normal, atraumatic, no cyanosis or edema    Lab Results:  Recent Labs  10/21/16 1217 10/22/16 0403  WBC 27.6* 28.1*  HGB 13.5 12.9*  HCT 41.7 39.0  PLT 89* 111*    BMET  Recent Labs  10/21/16 1217 10/22/16 0403  NA 140 139  K 2.9* 4.6  CL 104 103  CO2 27 27  GLUCOSE 82 134*  BUN 16 16  CREATININE 1.15 0.97  CALCIUM 8.8* 9.0    Studies/Results: Ct Angio Chest Pe W Or Wo Contrast  Result Date: 10/21/2016 CLINICAL DATA:  Worsening shortness of breath. Nonproductive cough and nasal swelling. History of leukemia. EXAM: CT ANGIOGRAPHY CHEST WITH CONTRAST TECHNIQUE: Multidetector CT imaging of the chest was performed using the standard protocol during bolus administration of intravenous contrast. Multiplanar CT image reconstructions and MIPs were obtained to evaluate the vascular anatomy. CONTRAST:  100 cc Isovue 370 intravenous COMPARISON:  None. FINDINGS: Cardiovascular: Satisfactory opacification of the pulmonary arteries to the segmental level. No evidence of  pulmonary embolism when allowing for intermittent motion and streak artifact. Normal heart size. No pericardial effusion. Mediastinum/Nodes: Negative for adenopathy. Lungs/Pleura: Left lower lobe bronchial obstruction at the lobar level, with tiny channel for of the superior segmental airways. This has a focal mass like appearance with  circumferential luminal soft tissue density. The left lower lobe is hyperinflated. There appears to be a fluid level within the right bronchus intermedius. No generalized airway thickening. No bronchiectasis. Negative for pneumonia or edema. No effusion or pneumothorax Upper Abdomen: Partial visualization of the spleen with significant decrease size from 2010. Subcentimeter cystic density in the upper posterior right liver. Musculoskeletal: No acute or aggressive finding Review of the MIP images confirms the above findings. IMPRESSION: 1. Left lower lobe bronchial obstruction and air trapping. The narrowing has a masslike appearance with circumferential luminal soft tissue density. Recommend pulmonary referral for bronchoscopic correlation. 2. Debris layers within the right bronchus intermedius. No generalized airway thickening. 3. Negative for pulmonary embolism. Electronically Signed   By: Monte Fantasia M.D.   On: 10/21/2016 13:55    Medications: I have reviewed the patient's current medications.  Assessment/Plan:  31 year old gentleman with the following issues:  1. CML in chronic phase diagnosed in 2010. His disease continues to be in remission and had a normal CBC on 09/13/2016. A repeat CBC in the last 2 days did show leukocytosis that is likely reactive to steroids and recent infection rather than exacerbation of his leukemia.  I recommended continuing Tasigna for the time being.  2. Left lower lobe bronchial obstruction and air trapping and suggestion of a mass: These findings are not part of CML natural history. A primary lung neoplasm would be possible but considered less likely. These findings could represent either infectious causes or possibly autoimmune causes given his recurrent sinus problems.  I recommend pulmonary evaluation and a possible bronchoscopy if indicated. I agree with the current management as you're doing.  Please call with any questions regarding this pleasant  gentleman.   LOS: 0 days   [facility] 10/22/2016, 7:46 AM

## 2016-10-23 ENCOUNTER — Encounter (HOSPITAL_COMMUNITY)
Admission: EM | Disposition: A | Discharge status: Home / Self Care | Payer: Self-pay | Source: Home / Self Care | Attending: Internal Medicine

## 2016-10-23 ENCOUNTER — Inpatient Hospital Stay (HOSPITAL_COMMUNITY): Payer: Managed Care, Other (non HMO)

## 2016-10-23 ENCOUNTER — Encounter (HOSPITAL_COMMUNITY): Payer: Self-pay | Admitting: Respiratory Therapy

## 2016-10-23 DIAGNOSIS — D696 Thrombocytopenia, unspecified: Secondary | ICD-10-CM

## 2016-10-23 DIAGNOSIS — Z8669 Personal history of other diseases of the nervous system and sense organs: Secondary | ICD-10-CM

## 2016-10-23 DIAGNOSIS — R938 Abnormal findings on diagnostic imaging of other specified body structures: Secondary | ICD-10-CM

## 2016-10-23 DIAGNOSIS — Z9889 Other specified postprocedural states: Secondary | ICD-10-CM

## 2016-10-23 HISTORY — PX: VIDEO BRONCHOSCOPY: SHX5072

## 2016-10-23 LAB — BASIC METABOLIC PANEL
Anion gap: 8 (ref 5–15)
BUN: 18 mg/dL (ref 6–20)
CO2: 26 mmol/L (ref 22–32)
CREATININE: 0.86 mg/dL (ref 0.61–1.24)
Calcium: 8.5 mg/dL — ABNORMAL LOW (ref 8.9–10.3)
Chloride: 107 mmol/L (ref 101–111)
GFR calc non Af Amer: >60 mL/min (ref >60)
Glucose, Bld: 138 mg/dL — ABNORMAL HIGH (ref 65–99)
Potassium: 3.7 mmol/L (ref 3.5–5.1)
Sodium: 141 mmol/L (ref 135–145)

## 2016-10-23 LAB — CBC
HCT: 38.1 % — ABNORMAL LOW (ref 39.0–52.0)
Hemoglobin: 12 g/dL — ABNORMAL LOW (ref 13.0–17.0)
MCH: 23.2 pg — AB (ref 26.0–34.0)
MCHC: 31.5 g/dL (ref 30.0–36.0)
MCV: 73.6 fL — AB (ref 78.0–100.0)
PLATELETS: 116 10*3/uL — AB (ref 150–400)
RBC: 5.18 MIL/uL (ref 4.22–5.81)
RDW: 18.5 % — AB (ref 11.5–15.5)
WBC: 33.8 10*3/uL — ABNORMAL HIGH (ref 4.0–10.5)

## 2016-10-23 LAB — IGE: IgE (Immunoglobulin E), Serum: 12 IU/mL (ref 0–100)

## 2016-10-23 LAB — LEGIONELLA PNEUMOPHILA SEROGP 1 UR AG: L. PNEUMOPHILA SEROGP 1 UR AG: NEGATIVE

## 2016-10-23 SURGERY — VIDEO BRONCHOSCOPY WITHOUT FLUORO
Anesthesia: Moderate Sedation | Laterality: Bilateral

## 2016-10-23 MED ORDER — FENTANYL CITRATE (PF) 100 MCG/2ML IJ SOLN
INTRAMUSCULAR | Status: DC | PRN
  Administered 2016-10-23 (×7): 25 ug via INTRAVENOUS

## 2016-10-23 MED ORDER — PROAIR HFA 108 (90 BASE) MCG/ACT IN AERS: 2.0000 | INHALATION_SPRAY | RESPIRATORY_TRACT | 1 refills | Status: DC | PRN

## 2016-10-23 MED ORDER — FENTANYL CITRATE (PF) 100 MCG/2ML IJ SOLN
INTRAMUSCULAR | Status: AC
Start: 2016-10-23 — End: 2016-10-23
  Filled 2016-10-23: qty 4

## 2016-10-23 MED ORDER — BENZONATATE 100 MG PO CAPS: 100.0000 mg | ORAL_CAPSULE | Freq: Every day | ORAL | 0 refills | Status: DC

## 2016-10-23 MED ORDER — MONTELUKAST SODIUM 10 MG PO TABS: 10.0000 mg | ORAL_TABLET | Freq: Every day | ORAL | 0 refills | Status: DC

## 2016-10-23 MED ORDER — MIDAZOLAM HCL 10 MG/2ML IJ SOLN
INTRAMUSCULAR | Status: DC | PRN
  Administered 2016-10-23 (×7): 1 mg via INTRAVENOUS

## 2016-10-23 MED ORDER — SODIUM CHLORIDE 0.9 % IV SOLN
INTRAVENOUS | Status: DC
  Administered 2016-10-23: 09:00:00 via INTRAVENOUS

## 2016-10-23 MED ORDER — MIDAZOLAM HCL 5 MG/ML IJ SOLN
INTRAMUSCULAR | Status: AC
  Filled 2016-10-23: qty 2

## 2016-10-23 MED ORDER — GUAIFENESIN ER 600 MG PO TB12: 600.0000 mg | ORAL_TABLET | Freq: Two times a day (BID) | ORAL | 0 refills | Status: DC

## 2016-10-23 MED ORDER — FLUTICASONE FUROATE-VILANTEROL 100-25 MCG/INH IN AEPB: 1.0000 | INHALATION_SPRAY | Freq: Every day | RESPIRATORY_TRACT | 0 refills | Status: DC

## 2016-10-23 MED ORDER — PREDNISONE 20 MG PO TABS: 60.0000 mg | ORAL_TABLET | Freq: Every day | ORAL | 0 refills | Status: DC

## 2016-10-23 MED ORDER — LIDOCAINE HCL 1 % IJ SOLN
INTRAMUSCULAR | Status: DC | PRN
  Administered 2016-10-23: 6 mL via RESPIRATORY_TRACT

## 2016-10-23 NOTE — Op Note (Signed)
[facility] Cardiopulmonary Patient Name: Nathan Kerr Procedure Date: 10/23/2016 MRN: [id number] Attending MD: Leanna Sato. Elsworth Soho MD, MD Date of Birth: 12/21/1985 CSN: [id number] Age: 31 Admit Type: Inpatient Ethnicity: Not Hispanic or Latino Procedure:            Bronchoscopy Indications:          Left lower lobe mass Providers:            Leanna Sato. Elsworth Soho MD, MD, Andre Lefort RRT,RCP, Cherre Huger RRT, RCP Referring MD:          Medicines:            Lidocaine applied to nares and subglottic space,                        Fentanyl 175 mcg IV, Midazolam 7 mg IV Complications:        No immediate complications. Estimated blood loss:                        Minimal Estimated Blood Loss: Estimated blood loss was minimal. Procedure:      Pre-Anesthesia Assessment:      - A History and Physical has been performed. Patient meds and allergies       have been reviewed. The risks and benefits of the procedure and the       sedation options and risks were discussed with the patient. All       questions were answered and informed consent was obtained. Patient       identification and proposed procedure were verified prior to the       procedure by the physician in the procedure room. Mental Status       Examination: normal. Airway Examination: Mallampati Class II (the uvula       but not tonsillar pillars visualized). Respiratory Examination: clear to       auscultation. CV Examination: normal. ASA Grade Assessment: II - A       patient with mild systemic disease. After reviewing the risks and       benefits, the patient was deemed in satisfactory condition to undergo       the procedure. The anesthesia plan was to use moderate sedation /       analgesia (conscious sedation). Immediately prior to administration of       medications, the patient was re-assessed for adequacy to receive       sedatives. The heart rate, respiratory rate, oxygen saturations,  blood       pressure, adequacy of pulmonary ventilation, and response to care were       monitored throughout the procedure. The physical status of the patient       was re-assessed after the procedure.      After obtaining informed consent, the bronchoscope was passed under       direct vision. Throughout the procedure, the patient's blood pressure,       pulse, and oxygen saturations were monitored continuously. the [id number]       [id number] scope was introduced through the mouth and advanced to the       tracheobronchial tree. The patient tolerated the procedure fairly well. Findings:      Bronchoalveolar lavage was performed in the left lower lobe of the lung  and sent for cell count, bacterial culture, viral smears & culture, and       fungal & AFB analysis and cytology. The return was blood-tinged. There       were no mucoid plugs in the return fluid.      Brushings of a endobronchial narrowing were obtained from LLL[Site]       [Device] [Analysis]. [Number of samples].      Endobronchial biopsies of a lesion were performed in the left lower lobe       using a forceps and sent for histopathology examination. Three samples       were obtained. Impression:      - Left lower lobe mass      - Bronchoalveolar lavage was performed.      - Brushings were obtained.      - An endobronchial biopsy was performed. Moderate Sedation:      Moderate (conscious) sedation was personally administered by the       endoscopist. The following parameters were monitored: oxygen saturation,       heart rate, blood pressure, and response to care. Total physician       intraservice time was 20 minutes. Recommendation:      - Await test results. Procedure Code(s):      --- Professional ---      [phone], Bronchoscopy, rigid or flexible, including fluoroscopic guidance,       when performed; with bronchial or endobronchial biopsy(s), single or       multiple sites      31624, Bronchoscopy, rigid or  flexible, including fluoroscopic guidance,       when performed; with bronchial alveolar lavage      99152, Moderate sedation services provided by the same physician or       other qualified health care professional performing the diagnostic or       therapeutic service that the sedation supports, requiring the presence       of an independent trained observer to assist in the monitoring of the       patient's level of consciousness and physiological status; initial 15       minutes of intraservice time, patient age 57 years or older Diagnosis Code(s):      --- Professional ---      R91.8, Other nonspecific abnormal finding of lung field CPT copyright 2016 [employer] Association. All rights reserved. The codes documented in this report are preliminary and upon coder review may  be revised to meet current compliance requirements. Kara Mead, MD Leanna Sato Elsworth Soho MD, MD 10/23/2016 10:23:09 AM This report has been signed electronically. Number of Addenda: 0 Scope In: 9:57:49 AM Scope Out: 10:12:49 AM

## 2016-10-23 NOTE — Discharge Instructions (Signed)

## 2016-10-23 NOTE — Progress Notes (Signed)
PULMONARY / CRITICAL CARE MEDICINE   Name: Nathan Kerr MRN: [id number] DOB: 12/11/1985    ADMISSION DATE:  10/21/2016 CONSULTATION DATE:  10/22/2016  REFERRING MD:  Dr. Wynelle Cleveland  CHIEF COMPLAINT:  Shortness of breath and cough  HISTORY OF PRESENT ILLNESS:   31 year old male with chronic myelocytic anemia in remission presented  with cough and shortness of breath, recurrent sinusitis    STUDIES:  June 16 CT chest images independently reviewed showing normal pulmonary parenchyma without abnormality, in the left lower lobe there is airway narrowing which appears to be due to extrinsic compression from a soft tissue density. There is some air-trapping in the left lower lobe.  CULTURES: 10/21/2016 blood culture >   ANTIBIOTICS: 6/16 unasyn >   SUBJECTIVE:   breathing ok C/o dry cough Sinus congestion  VITAL SIGNS: BP 115/77   Pulse 73   Temp 97.9 F (36.6 C) (Oral)   Resp 17   Ht 5\' 10"  (1.778 m)   Wt 205 lb (93 kg)   SpO2 94%   BMI 29.41 kg/m   HEMODYNAMICS:    VENTILATOR SETTINGS:    INTAKE / OUTPUT: I/O last 3 completed shifts: In: 2346.3 [P.O.:480; I.V.:1666.3; IV Piggyback:200] Out: -   PHYSICAL EXAMINATION: Gen: well appearing, no acute distress HENT: NCAT, OP clear, neck supple without masses Eyes: PERRL, EOMi, no pallor, icterus Lymph: no cervical lymphadenopathy PULM: CTA B CV: RRR, no mgr, no JVD GI: BS+, soft, nontender, no hsm Derm: no rash or skin breakdown MSK: normal bulk and tone Neuro: A&Ox4, CN II-XII intact, strength 5/5 in all 4 extremities    LABS:  BMET  Recent Labs Lab 10/21/16 1217 10/22/16 0403 10/23/16 0357  NA 140 139 141  K 2.9* 4.6 3.7  CL 104 103 107  CO2 27 27 26   BUN 16 16 18   CREATININE 1.15 0.97 0.86  GLUCOSE 82 134* 138*    Electrolytes  Recent Labs Lab 10/21/16 1217 10/22/16 0403 10/23/16 0357  CALCIUM 8.8* 9.0 8.5*  MG  --  2.5*  --     CBC  Recent Labs Lab 10/21/16 1217 10/22/16 0403  10/23/16 0357  WBC 27.6* 28.1* 33.8*  HGB 13.5 12.9* 12.0*  HCT 41.7 39.0 38.1*  PLT 89* 111* 116*    Coag's  Recent Labs Lab 10/22/16 0403  INR 1.05    Sepsis Markers No results for input(s): LATICACIDVEN, PROCALCITON, O2SATVEN in the last 168 hours.  ABG No results for input(s): PHART, PCO2ART, PO2ART in the last 168 hours.  Liver Enzymes  Recent Labs Lab 10/22/16 0403  AST 17  ALT 69*  ALKPHOS 77  BILITOT 0.4  ALBUMIN 3.5    Cardiac Enzymes No results for input(s): TROPONINI, PROBNP in the last 168 hours.  Glucose No results for input(s): GLUCAP in the last 168 hours.  Imaging Dg Chest Port 1 View  Result Date: 10/23/2016 CLINICAL DATA:  Status post bronchoscopy EXAM: PORTABLE CHEST 1 VIEW COMPARISON:  10/21/2016 FINDINGS: Normal heart size. Lungs clear. No pneumothorax. No pleural effusion. IMPRESSION: No active disease. Electronically Signed   By: Marybelle Killings M.D.   On: 10/23/2016 10:34   Dg C-arm Bronchoscopy  Result Date: 10/23/2016 C-ARM BRONCHOSCOPY: Fluoroscopy was utilized by the requesting physician.  No radiographic interpretation.     ASSESSMENT / PLAN:  PULMONARY A: Left lower lobe airway narrowing > uncertain cause, malignancy possible given appearance or perhaps an atypical infection given his hematologic history; consider allergic (ABPA vs less likely vasculitis) P:  Appears extrinsic compression of airway & involving bronchial wall Bronchial brushings & biopsies taken , LLL appeared open & narrowing could be traversed with forceps. Suggest ANA, ANCA for completion He would really like to discharge & we will make outpt FU in 4 days for results  Kara Mead MD. FCCP. [facility] Pager [phone] If no response call [phone]   10/23/2016, 10:44 AM

## 2016-10-23 NOTE — Progress Notes (Signed)
Patient discharged to home, all discharge medications and instructions reviewed and questions answered.   

## 2016-10-23 NOTE — Progress Notes (Signed)
Video Bronchoscopy done  Intervention Bronchial washing Intervention Bronchial brushing Intervention Bronchial biopsy   Procedure tolerated well

## 2016-10-23 NOTE — Discharge Summary (Addendum)
Physician Discharge Summary  Nathan Kerr JYN:[id number] DOB: 05/13/1985 DOA: 10/21/2016  PCP: Wardell Honour, MD  Admit date: 10/21/2016 Discharge date: 10/23/2016  Admitted From: home Disposition:  home   Recommendations for Outpatient Follow-up:  1. Needs CBC in 1 wk  Discharge Condition:  stable   CODE STATUS:  Full code   Consultations:  PCCM  Oncology    Discharge Diagnoses:  Principal Problem:   Asthma, chronic, unspecified asthma severity, with acute exacerbation Active Problems:   Allergic rhinitis   Endobronchial mass   History of migraine headaches   CML (chronic myelocytic leukemia) (HCC)    Subjective: No complaints of dyspnea or wheezing at this time. He underwent a bronchoscopy this AM.   Brief Summary: 31 y/o male with has a H/o childhood asthma, h/oCML maintained on Nilotinib who is admitted for cough, wheezing and dyspnea. He has had an extensive issues with sinus issues, congestion and wheezing over the past 2 months. He had epistaxis in April and required cauterization. He subsequently developed "sinus issues" and was treated with multiple courses of antibiotics including Amoxil, Levaquin and then Clindamycin. At times he was given Prednisone with the antibiotics and feels that he would improve temporarily but the symptoms would return soon after the course was completed.   He subsequently developed chest congestion a little over a week ago [facility] and [facility] Prednisone which made him feel significantly better. The day after he came off the Prednisone he felt sick once a day with cough, congestion and wheezing.   Hospital Course:  Principal Problem:   Asthma, chronic, unspecified asthma severity, with acute exacerbation   Allergic rhinitis - suspect asthma and rhinitis as the cause of his issues over the past few weeks - I have added Singulair, weaned Solumedrol to Prednisone and added a steroid inhaler and a rescue  inhaler - he is using intranasal Fluticasone as well but has had nose bleeds with this in the past and will need to be cautious - his symptoms are well controlled at this point  - I have asked him to follow up with an Allergy and Immunology   Active Problems: Left lower lobe airway narrowing - have consulted PCCM who performed a bronchoscopy today - notes from Bronchoscopy suggest he has extrinsic compression of left lower airway- biopsies done, ANA and ANCA levels sent- they will f/u in the office in a few days for results  Hypokalemia - replaced    CML (chronic myelocytic leukemia)  -  cont Nilotinib    Acute thrombocytopenia - follow- ? Due to underlying infection- is improving  Discharge Instructions  Discharge Instructions    Diet - low sodium heart healthy    Complete by:  As directed    Increase activity slowly    Complete by:  As directed      Allergies as of 10/23/2016   No Known Allergies     Medication List    TAKE these medications   acetaminophen 500 MG tablet Commonly known as:  TYLENOL Take 1,000 mg by mouth every 6 (six) hours as needed for mild pain.   benzonatate 100 MG capsule Commonly known as:  TESSALON Take 1 capsule (100 mg total) by mouth daily.   cetirizine 10 MG tablet Commonly known as:  ZYRTEC Take 10 mg by mouth daily.   fluticasone furoate-vilanterol 100-25 MCG/INH Aepb Commonly known as:  BREO ELLIPTA Inhale 1 puff into the lungs daily. Start taking on:  10/24/2016  guaiFENesin 600 MG 12 hr tablet Commonly known as:  MUCINEX Take 1 tablet (600 mg total) by mouth 2 (two) times daily.   ibuprofen 200 MG tablet Commonly known as:  ADVIL,MOTRIN Take 400-600 mg by mouth every 6 (six) hours as needed.   meloxicam 15 MG tablet Commonly known as:  MOBIC Take 1 tablet (15 mg total) by mouth daily. PATIENT NEEDS OFFICE VISIT FOR ADDITIONAL REFILLS   montelukast 10 MG tablet Commonly known as:  SINGULAIR Take 1 tablet (10 mg  total) by mouth at bedtime.   nilotinib 150 MG capsule Commonly known as:  TASIGNA Take 2 capsules (300 mg total) by mouth every 12 (twelve) hours.   oxyCODONE-acetaminophen 10-325 MG tablet Commonly known as:  PERCOCET Take 1 tablet by mouth every 6 (six) hours as needed for pain.   predniSONE 20 MG tablet Commonly known as:  DELTASONE Take 3 tablets (60 mg total) by mouth daily with breakfast. 60 mg x 2 days 50 mg x 2 days 40 mg x 2 days 30 mg x 2 days 20 mg x 2 days 10 mg x 2 days Start taking on:  10/24/2016   PROAIR HFA 108 (90 Base) MCG/ACT inhaler Generic drug:  albuterol Inhale 2 puffs into the lungs every 4 (four) hours as needed for wheezing.   SUMAtriptan 100 MG tablet Commonly known as:  IMITREX Take 1 tablet at onset of headache. May repeat in 2 hrs if needed, MAX 2 tabs/24 hrs.      Follow-up Information    Rigoberto Noel, MD. Schedule an appointment as soon as possible for a visit on 10/27/2016.   Specialty:  Pulmonary Disease Why:  at 945 am for 10 am w/ alva  Contact information: [address] [address] [phone]        Jiles Prows, MD Follow up.   Specialty:  Allergy and Immunology Contact information: [place] [address] [phone]          No Known Allergies   Procedures/Studies: 10/23/16 Bronchoscopy  Ct Angio Chest Pe W Or Wo Contrast  Result Date: 10/21/2016 CLINICAL DATA:  Worsening shortness of breath. Nonproductive cough and nasal swelling. History of leukemia. EXAM: CT ANGIOGRAPHY CHEST WITH CONTRAST TECHNIQUE: Multidetector CT imaging of the chest was performed using the standard protocol during bolus administration of intravenous contrast. Multiplanar CT image reconstructions and MIPs were obtained to evaluate the vascular anatomy. CONTRAST:  100 cc Isovue 370 intravenous COMPARISON:  None. FINDINGS: Cardiovascular: Satisfactory opacification of the pulmonary arteries to the segmental level. No evidence  of pulmonary embolism when allowing for intermittent motion and streak artifact. Normal heart size. No pericardial effusion. Mediastinum/Nodes: Negative for adenopathy. Lungs/Pleura: Left lower lobe bronchial obstruction at the lobar level, with tiny channel for of the superior segmental airways. This has a focal mass like appearance with circumferential luminal soft tissue density. The left lower lobe is hyperinflated. There appears to be a fluid level within the right bronchus intermedius. No generalized airway thickening. No bronchiectasis. Negative for pneumonia or edema. No effusion or pneumothorax Upper Abdomen: Partial visualization of the spleen with significant decrease size from 2010. Subcentimeter cystic density in the upper posterior right liver. Musculoskeletal: No acute or aggressive finding Review of the MIP images confirms the above findings. IMPRESSION: 1. Left lower lobe bronchial obstruction and air trapping. The narrowing has a masslike appearance with circumferential luminal soft tissue density. Recommend pulmonary referral for bronchoscopic correlation. 2. Debris layers within the  right bronchus intermedius. No generalized airway thickening. 3. Negative for pulmonary embolism. Electronically Signed   By: Monte Fantasia M.D.   On: 10/21/2016 13:55   Dg Chest Port 1 View  Result Date: 10/23/2016 CLINICAL DATA:  Status post bronchoscopy EXAM: PORTABLE CHEST 1 VIEW COMPARISON:  10/21/2016 FINDINGS: Normal heart size. Lungs clear. No pneumothorax. No pleural effusion. IMPRESSION: No active disease. Electronically Signed   By: Marybelle Killings M.D.   On: 10/23/2016 10:34   Dg C-arm Bronchoscopy  Result Date: 10/23/2016 C-ARM BRONCHOSCOPY: Fluoroscopy was utilized by the requesting physician.  No radiographic interpretation.       Discharge Exam: Vitals:   10/23/16 1050 10/23/16 1425  BP:  127/86  Pulse:  100  Resp: 15 16  Temp:  98.9 F (37.2 C)   Vitals:   10/23/16 1040 10/23/16  1045 10/23/16 1050 10/23/16 1425  BP: 115/71 113/70  127/86  Pulse:    100  Resp: 16 15 15 16   Temp:    98.9 F (37.2 C)  TempSrc:    Oral  SpO2: 96% 95% 94% 98%  Weight:      Height:        General: Pt is alert, awake, not in acute distress Cardiovascular: RRR, S1/S2 +, no rubs, no gallops Respiratory: CTA bilaterally, no wheezing, no rhonchi Abdominal: Soft, NT, ND, bowel sounds + Extremities: no edema, no cyanosis    The results of significant diagnostics from this hospitalization (including imaging, microbiology, ancillary and laboratory) are listed below for reference.     Microbiology: Recent Results (from the past 240 hour(s))  Blood culture (routine x 2)     Status: None (Preliminary result)   Collection Time: 10/21/16  3:47 PM  Result Value Ref Range Status   Specimen Description BLOOD RIGHT HAND  Final   Special Requests   Final    BOTTLES DRAWN AEROBIC AND ANAEROBIC Blood Culture adequate volume   Culture   Final    NO GROWTH < 24 HOURS Performed at [facility], [address] [place], [place] [address]    Report Status PENDING  Incomplete  Blood culture (routine x 2)     Status: None (Preliminary result)   Collection Time: 10/21/16  3:52 PM  Result Value Ref Range Status   Specimen Description BLOOD LEFT FOREARM  Final   Special Requests   Final    BOTTLES DRAWN AEROBIC AND ANAEROBIC Blood Culture adequate volume   Culture   Final    NO GROWTH < 24 HOURS Performed at [facility], [place] [address], [place], [place] [address]    Report Status PENDING  Incomplete  MRSA PCR Screening     Status: None   Collection Time: 10/21/16  6:33 PM  Result Value Ref Range Status   MRSA by PCR NEGATIVE NEGATIVE Final    Comment:        The GeneXpert MRSA Assay (FDA approved for NASAL specimens only), is one component of a comprehensive MRSA colonization surveillance program. It is not intended to diagnose MRSA infection nor to guide or monitor  treatment for MRSA infections.      Labs: BNP (last 3 results) No results for input(s): BNP in the last 8760 hours. Basic Metabolic Panel:  Recent Labs Lab 10/21/16 1217 10/22/16 0403 10/23/16 0357  NA 140 139 141  K 2.9* 4.6 3.7  CL 104 103 107  CO2 27 27 26   GLUCOSE 82 134* 138*  BUN 16 16 18  CREATININE 1.15 0.97 0.86  CALCIUM 8.8* 9.0 8.5*  MG  --  2.5*  --    Liver Function Tests:  Recent Labs Lab 10/22/16 0403  AST 17  ALT 69*  ALKPHOS 77  BILITOT 0.4  PROT 7.0  ALBUMIN 3.5   No results for input(s): LIPASE, AMYLASE in the last 168 hours. No results for input(s): AMMONIA in the last 168 hours. CBC:  Recent Labs Lab 10/21/16 1217 10/22/16 0403 10/23/16 0357  WBC 27.6* 28.1* 33.8*  NEUTROABS 21.5* 25.8*  --   HGB 13.5 12.9* 12.0*  HCT 41.7 39.0 38.1*  MCV 73.7* 73.3* 73.6*  PLT 89* 111* 116*   Cardiac Enzymes: No results for input(s): CKTOTAL, CKMB, CKMBINDEX, TROPONINI in the last 168 hours. BNP: Invalid input(s): POCBNP CBG: No results for input(s): GLUCAP in the last 168 hours. D-Dimer No results for input(s): DDIMER in the last 72 hours. Hgb A1c No results for input(s): HGBA1C in the last 72 hours. Lipid Profile No results for input(s): CHOL, HDL, LDLCALC, TRIG, CHOLHDL, LDLDIRECT in the last 72 hours. Thyroid function studies No results for input(s): TSH, T4TOTAL, T3FREE, THYROIDAB in the last 72 hours.  Invalid input(s): FREET3 Anemia work up No results for input(s): VITAMINB12, FOLATE, FERRITIN, TIBC, IRON, RETICCTPCT in the last 72 hours. Urinalysis    Component Value Date/Time   COLORURINE YELLOW 10/21/2016 1217   APPEARANCEUR CLEAR 10/21/2016 1217   LABSPEC 1.020 10/21/2016 1217   PHURINE 5.5 10/21/2016 1217   GLUCOSEU NEGATIVE 10/21/2016 1217   HGBUR NEGATIVE 10/21/2016 1217   BILIRUBINUR NEGATIVE 10/21/2016 1217   KETONESUR NEGATIVE 10/21/2016 1217   PROTEINUR NEGATIVE 10/21/2016 1217   UROBILINOGEN 0.2 08/12/2008 1818    NITRITE NEGATIVE 10/21/2016 1217   LEUKOCYTESUR NEGATIVE 10/21/2016 1217   Sepsis Labs Invalid input(s): PROCALCITONIN,  WBC,  LACTICIDVEN Microbiology Recent Results (from the past 240 hour(s))  Blood culture (routine x 2)     Status: None (Preliminary result)   Collection Time: 10/21/16  3:47 PM  Result Value Ref Range Status   Specimen Description BLOOD RIGHT HAND  Final   Special Requests   Final    BOTTLES DRAWN AEROBIC AND ANAEROBIC Blood Culture adequate volume   Culture   Final    NO GROWTH < 24 HOURS Performed at [facility], [place] [address], [place], [place] [address]    Report Status PENDING  Incomplete  Blood culture (routine x 2)     Status: None (Preliminary result)   Collection Time: 10/21/16  3:52 PM  Result Value Ref Range Status   Specimen Description BLOOD LEFT FOREARM  Final   Special Requests   Final    BOTTLES DRAWN AEROBIC AND ANAEROBIC Blood Culture adequate volume   Culture   Final    NO GROWTH < 24 HOURS Performed at [facility], [place] [address] [place], [place] [address]    Report Status PENDING  Incomplete  MRSA PCR Screening     Status: None   Collection Time: 10/21/16  6:33 PM  Result Value Ref Range Status   MRSA by PCR NEGATIVE NEGATIVE Final    Comment:        The GeneXpert MRSA Assay (FDA approved for NASAL specimens only), is one component of a comprehensive MRSA colonization surveillance program. It is not intended to diagnose MRSA infection nor to guide or monitor treatment for MRSA infections.      Time coordinating discharge: Over 30 minutes  SIGNED:   Eunice Blase  Wynelle Cleveland, MD  [facility] 10/23/2016, 3:21 PM Pager   If 7PM-7AM, please contact night-coverage [email or web address] Password [email or web address]

## 2016-10-23 NOTE — Progress Notes (Signed)
Nutrition Brief Note  Patient identified on the Malnutrition Screening Tool (MST) Report  Pt eating 100% of meals. Weight has overall increased with some fluctuations since 2015.  Wt Readings from Last 15 Encounters:  10/21/16 205 lb (93 kg)  09/13/16 210 lb 4.8 oz (95.4 kg)  07/05/16 224 lb (101.6 kg)  05/27/15 214 lb 3.2 oz (97.2 kg)  02/03/15 214 lb (97.1 kg)  12/14/14 210 lb 6.4 oz (95.4 kg)  07/21/14 216 lb 3.2 oz (98.1 kg)  03/20/14 210 lb (95.3 kg)  11/03/13 200 lb 9.6 oz (91 kg)  10/15/13 198 lb 12.8 oz (90.2 kg)  08/21/13 195 lb 14.4 oz (88.9 kg)  04/22/13 175 lb 4.8 oz (79.5 kg)  03/20/13 178 lb 8 oz (81 kg)  03/11/13 186 lb (84.4 kg)  12/27/11 200 lb 12.8 oz (91.1 kg)    Body mass index is 29.41 kg/m. Patient meets criteria for overweight based on current BMI.   Current diet order is regular, patient is consuming approximately 100% of meals at this time. Labs and medications reviewed.   No nutrition interventions warranted at this time. If nutrition issues arise, please consult RD.   Clayton Bibles, MS, RD, LDN Pager: [phone] After Hours Pager: [phone]

## 2016-10-24 LAB — ANCA TITERS: Atypical P-ANCA titer: <1:20 {titer}

## 2016-10-24 LAB — ANA W/REFLEX IF POSITIVE: ANA: NEGATIVE

## 2016-10-25 ENCOUNTER — Encounter (HOSPITAL_COMMUNITY): Payer: Self-pay | Admitting: Pulmonary Disease

## 2016-10-25 LAB — ACID FAST SMEAR (AFB, MYCOBACTERIA): Acid Fast Smear: NEGATIVE

## 2016-10-25 LAB — LEUKOCYTE ALKALINE PHOSPHATASE: Leukocyte Alkaline  Phos Stain: 87 (ref 25–130)

## 2016-10-25 LAB — IGE: IgE (Immunoglobulin E), Serum: 12 [IU]/mL (ref 0–100)

## 2016-10-25 LAB — CULTURE, BAL-QUANTITATIVE W GRAM STAIN

## 2016-10-25 LAB — CULTURE, BAL-QUANTITATIVE: CULTURE: NORMAL — AB

## 2016-10-26 ENCOUNTER — Encounter: Payer: Self-pay | Admitting: Family

## 2016-10-26 ENCOUNTER — Ambulatory Visit (INDEPENDENT_AMBULATORY_CARE_PROVIDER_SITE_OTHER): Payer: Managed Care, Other (non HMO) | Admitting: Family

## 2016-10-26 VITALS — BP 116/74 | HR 96 | Temp 98.4°F | Ht 70.0 in | Wt 211.0 lb

## 2016-10-26 DIAGNOSIS — C9210 Chronic myeloid leukemia, BCR/ABL-positive, not having achieved remission: Secondary | ICD-10-CM | POA: Diagnosis not present

## 2016-10-26 DIAGNOSIS — R918 Other nonspecific abnormal finding of lung field: Secondary | ICD-10-CM

## 2016-10-26 DIAGNOSIS — G43009 Migraine without aura, not intractable, without status migrainosus: Secondary | ICD-10-CM | POA: Diagnosis not present

## 2016-10-26 LAB — CULTURE, BLOOD (ROUTINE X 2)
CULTURE: NO GROWTH
CULTURE: NO GROWTH
SPECIAL REQUESTS: ADEQUATE
Special Requests: ADEQUATE

## 2016-10-26 NOTE — Assessment & Plan Note (Signed)
Stable with current medication regimen and no adverse side effects. Continue current dosage of nilotinib with follow up and changes per oncology.

## 2016-10-26 NOTE — Assessment & Plan Note (Signed)
Stable with no current headache and normal neurological exam. Continue abortive therapy with meloxicam and sumatriptan as needed. Follow up if headache frequency or intensity increases. Continue to monitor.

## 2016-10-26 NOTE — Assessment & Plan Note (Signed)
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 Continue to follow up with pulmonology as scheduled. Continue current prednisone taper.

## 2016-10-26 NOTE — Patient Instructions (Addendum)
Thank you for choosing [facility].  SUMMARY AND INSTRUCTIONS:  Continue to take your medication as prescribed.   Follow up with Dr. Elsworth Soho and Dr. Alen Blew.   Schedule a time for your physical at your convenience.  Medication:  Your prescription(s) have been submitted to your pharmacy or been printed and provided for you. Please take as directed and contact our office if you believe you are having problem(s) with the medication(s) or have any questions.  Follow up:  If your symptoms worsen or fail to improve, please contact our office for further instruction, or in case of emergency go directly to the emergency room at the closest medical facility.

## 2016-10-26 NOTE — Progress Notes (Signed)
Subjective:    Patient ID: Nathan Kerr, male    DOB: 11-28-1985, 31 y.o.   MRN: [id number]  Chief Complaint  Patient presents with  . Establish Care    HPI:  Nathan Kerr is a 31 y.o. male who  has a past medical history of Allergy; Asthma; Chronic myeloid leukemia in remission ([place]); CML (chronic myelocytic leukemia) ([place]); and Migraine. and presents today for an office visit to establish care.  1.) CML - Currently maintained on Nilotinib and followed by oncology. Reports taking the medication as prescribed and denies adverse side effects. No current symptoms.  2.) Cough - Recently admitted to the hospital following multiple rounds of antibiotics with concern for sinus issues and failure of outpatient medications. He was given prednisone at times which makes his symptoms improved but would return once course of medication was completed. He was diagnosed with asthma and allergic rhinitis and started on prednisone taper as well as Singulair. Noted to have left lower lobe airway narrowing with bronchoscopy suggesting extrinsic compression of the left airway with biopsies completed and awaiting results. All hospital records, labs, and imaging reviewed in detail.   Since leaving the hospital he reports that he feels improved. Continues to take the prednisone taper as prescribed and has several doses remaining. Continues to take his medications as prescribed and denies adverse side effects. Has follow up with pulmonology next week. Denies fevers, chills, chest pain or shortness of breath.   3.) Migraines - Currently maintained on meloxicam as needed. Reports taking the medication as prescribed and denies adverse side effects. Symptoms are well controlled with current regimen. Denies worst headache of life or increasing frequency or intensity of headaches.   No Known Allergies    Outpatient Medications Prior to Visit  Medication Sig Dispense Refill  . acetaminophen (TYLENOL) 500 MG tablet  Take 1,000 mg by mouth every 6 (six) hours as needed for mild pain.    . benzonatate (TESSALON) 100 MG capsule Take 1 capsule (100 mg total) by mouth daily. 30 capsule 0  . cetirizine (ZYRTEC) 10 MG tablet Take 10 mg by mouth daily.    . fluticasone furoate-vilanterol (BREO ELLIPTA) 100-25 MCG/INH AEPB Inhale 1 puff into the lungs daily. 1 each 0  . guaiFENesin (MUCINEX) 600 MG 12 hr tablet Take 1 tablet (600 mg total) by mouth 2 (two) times daily. 30 tablet 0  . ibuprofen (ADVIL,MOTRIN) 200 MG tablet Take 400-600 mg by mouth every 6 (six) hours as needed.    . meloxicam (MOBIC) 15 MG tablet Take 1 tablet (15 mg total) by mouth daily. PATIENT NEEDS OFFICE VISIT FOR ADDITIONAL REFILLS 30 tablet 3  . montelukast (SINGULAIR) 10 MG tablet Take 1 tablet (10 mg total) by mouth at bedtime. 30 tablet 0  . nilotinib (TASIGNA) 150 MG capsule Take 2 capsules (300 mg total) by mouth every 12 (twelve) hours. 460 capsule 2  . oxyCODONE-acetaminophen (PERCOCET) 10-325 MG tablet Take 1 tablet by mouth every 6 (six) hours as needed for pain. 30 tablet 0  . PROAIR HFA 108 (90 Base) MCG/ACT inhaler Inhale 2 puffs into the lungs every 4 (four) hours as needed for wheezing. 1 Inhaler 1  . SUMAtriptan (IMITREX) 100 MG tablet Take 1 tablet at onset of headache. May repeat in 2 hrs if needed, MAX 2 tabs/24 hrs. 9 tablet 5  . predniSONE (DELTASONE) 20 MG tablet Take 3 tablets (60 mg total) by mouth daily with breakfast. 60 mg x 2 days 50 mg x  2 days 40 mg x 2 days 30 mg x 2 days 20 mg x 2 days 10 mg x 2 days 43 tablet 0   Facility-Administered Medications Prior to Visit  Medication Dose Route Frequency Provider Last Rate Last Dose  . ondansetron (ZOFRAN-ODT) disintegrating tablet 8 mg  8 mg Oral Once Leandrew Koyanagi, MD         Past Medical History:  Diagnosis Date  . Allergy   . Asthma    Childhood, not currently active  . Chronic myeloid leukemia in remission ([place])   . CML (chronic myelocytic leukemia)  ([place])   . Migraine       Past Surgical History:  Procedure Laterality Date  . APPENDECTOMY    . VIDEO BRONCHOSCOPY Bilateral 10/23/2016   Procedure: VIDEO BRONCHOSCOPY WITHOUT FLUORO;  Surgeon: Rigoberto Noel, MD;  Location: [facility];  Service: Cardiopulmonary;  Laterality: Bilateral;      Family History  Problem Relation Age of Onset  . Sleep apnea Mother   . Migraines Father       Social History   Social History  . Marital status: Married    Spouse name: N/A  . Number of children: 0  . Years of education: 32   Occupational History  . Account Lawyer for  [employer] History Main Topics  . Smoking status: Never Smoker  . Smokeless tobacco: Never Used  . Alcohol use Yes     Comment: occasional  . Drug use: No  . Sexual activity: Yes    Partners: Female   Other Topics Concern  . Not on file   Social History Narrative   Fun/Hobby: Video games, woodworking, gardening      Review of Systems  Constitutional: Negative for chills and fever.  Respiratory: Negative for chest tightness and shortness of breath.   Cardiovascular: Negative for chest pain, palpitations and leg swelling.  Neurological: Negative for dizziness, weakness and headaches.       Objective:    BP 116/74 (BP Location: Left Arm, Patient Position: Sitting, Cuff Size: Large)   Pulse 96   Temp 98.4 F (36.9 C) (Oral)   Ht 5\' 10"  (1.778 m)   Wt 211 lb (95.7 kg)   SpO2 99%   BMI 30.28 kg/m  Nursing note and vital signs reviewed.  Physical Exam  Constitutional: He is oriented to person, place, and time. He appears well-developed and well-nourished. No distress.  HENT:  Right Ear: Hearing, tympanic membrane, external ear and ear canal normal.  Left Ear: Hearing, tympanic membrane, external ear and ear canal normal.  Nose: Nose normal.  Mouth/Throat: Uvula is midline, oropharynx is clear and moist and mucous membranes are normal.  Neck: Neck supple.    Cardiovascular: Normal rate, regular rhythm and intact distal pulses.  Exam reveals no gallop and no friction rub.   No murmur heard. Pulmonary/Chest: Effort normal and breath sounds normal. No respiratory distress. He has no wheezes. He has no rales. He exhibits no tenderness.  Lymphadenopathy:    He has no cervical adenopathy.  Neurological: He is alert and oriented to person, place, and time.  Skin: Skin is warm and dry.  Psychiatric: He has a normal mood and affect. His behavior is normal. Judgment and thought content normal.        Assessment & Plan:   Problem List Items Addressed This Visit      Cardiovascular and Mediastinum   Migraine  Stable with no current headache and normal neurological exam. Continue abortive therapy with meloxicam and sumatriptan as needed. Follow up if headache frequency or intensity increases. Continue to monitor.         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 Continue to follow up with pulmonology as scheduled. Continue current prednisone taper.         Other   CML (chronic myelocytic leukemia) (HCC) - Primary    Stable with current medication regimen and no adverse side effects. Continue current dosage of nilotinib with follow up and changes per oncology.           I have discontinued [name]'s predniSONE. I am also having him maintain his SUMAtriptan, nilotinib, oxyCODONE-acetaminophen, meloxicam, cetirizine, ibuprofen, acetaminophen, benzonatate, fluticasone furoate-vilanterol, guaiFENesin, montelukast, and PROAIR HFA. We will continue to administer ondansetron.   Follow-up: Return in about 3 months (around 01/26/2017), or if symptoms worsen or fail to improve.  Mauricio Po, FNP  /n

## 2016-10-27 ENCOUNTER — Telehealth: Payer: Self-pay | Admitting: Pulmonary Disease

## 2016-10-27 ENCOUNTER — Ambulatory Visit (INDEPENDENT_AMBULATORY_CARE_PROVIDER_SITE_OTHER): Payer: Managed Care, Other (non HMO) | Admitting: Pulmonary Disease

## 2016-10-27 ENCOUNTER — Encounter: Payer: Self-pay | Admitting: Pulmonary Disease

## 2016-10-27 DIAGNOSIS — R918 Other nonspecific abnormal finding of lung field: Secondary | ICD-10-CM

## 2016-10-27 DIAGNOSIS — J45901 Unspecified asthma with (acute) exacerbation: Secondary | ICD-10-CM | POA: Diagnosis not present

## 2016-10-27 MED ORDER — PREDNISONE 10 MG PO TABS: 10.0000 mg | ORAL_TABLET | Freq: Every day | ORAL | 0 refills | Status: DC

## 2016-10-27 NOTE — Telephone Encounter (Signed)
I discussed with Dr. Alen Blew and we recommend doing PET scan in 2 months after treatment trial with prednisone  Please let patient know and schedule if willing

## 2016-10-27 NOTE — Progress Notes (Signed)
   Subjective:    Patient ID: Nathan Kerr, male    DOB: 1985-08-13, 31 y.o.   MRN: [id number]  HPI  31 year old male with chronic myelocytic anemia in remission adm  with cough and shortness of breath, recurrent sinusitis   Bronchoscopy Showed extrinsic compression of left lower lobe bronchus & involving bronchial wall Bronchial brushings & biopsies negative , LLL appeared open & narrowing could be traversed with forceps. Cultures all negative including AFB and fungal  He was discharged on prednisone and is feeling much improved, no hemoptysis or sputum production, shortness of breath and rattling in his chest have decreased. He is slowly tapering the prednisone, is down to 40 mg and wonders if his symptoms are due to come back    Significant tests/ events reviewed  10/2016  CT chest>> in the left lower lobe there is airway narrowing which appears to be due to extrinsic compression from a soft tissue density. There is some air-trapping in the left lower lobe.  ANA/ ANCA neg  Review of Systems neg for any significant sore throat, dysphagia, itching, sneezing, nasal congestion or excess/ purulent secretions, fever, chills, sweats, unintended wt loss, pleuritic or exertional cp, hempoptysis, orthopnea pnd or change in chronic leg swelling. Also denies presyncope, palpitations, heartburn, abdominal pain, nausea, vomiting, diarrhea or change in bowel or urinary habits, dysuria,hematuria, rash, arthralgias, visual complaints, headache, numbness weakness or ataxia.     Objective:   Physical Exam   Gen. Pleasant, well-nourished, in no distress ENT - no thrush, no post nasal drip Neck: No JVD, no thyromegaly, no carotid bruits Lungs: no use of accessory muscles, no dullness to percussion, clear without rales or rhonchi  Cardiovascular: Rhythm regular, heart sounds  normal, no murmurs or gallops, no peripheral edema Musculoskeletal: No deformities, no cyanosis or clubbing           Assessment & Plan:

## 2016-10-27 NOTE — Assessment & Plan Note (Signed)
Although no discrete mass seen and biopsies negative, I'm concerned about an infiltrative process involving the left lower lobe bronchus. Etiology is not clear, I discussed with dr Alen Blew and would recommend a two-month follow-up PET scan after a treatment trial with steroids. This does not seem to be related to [facility] or therapy for this

## 2016-10-27 NOTE — Telephone Encounter (Signed)
Pt is aware of RA's recommendation's and voiced his understanding. Pt agrees to proceed with PET in 2 months.  Order has been placed. Nothing further needed.

## 2016-10-27 NOTE — Assessment & Plan Note (Signed)
Due to frequent complains of sinobronchitis, would recommend extended treatment with prednisone this time. He will taper down to 10 mg and then stay at this dose until his follow-up visit in 4 weeks. Of note, ANCA was negative  Detailed discussion with him and his wife

## 2016-10-27 NOTE — Patient Instructions (Signed)
Follow-up options including repeat CT chest in 3 months versus scheduled PET scan now I will discuss with Dr. Alen Blew and let she now.  Extended taper of prednisone-stay on 10 mg for 3 weeks

## 2016-10-31 ENCOUNTER — Encounter: Payer: Self-pay | Admitting: Allergy and Immunology

## 2016-10-31 ENCOUNTER — Ambulatory Visit (INDEPENDENT_AMBULATORY_CARE_PROVIDER_SITE_OTHER): Payer: Managed Care, Other (non HMO) | Admitting: Allergy and Immunology

## 2016-10-31 VITALS — BP 120/80 | HR 92 | Temp 98.6°F | Resp 20 | Ht 70.0 in | Wt 216.4 lb

## 2016-10-31 DIAGNOSIS — R918 Other nonspecific abnormal finding of lung field: Secondary | ICD-10-CM | POA: Diagnosis not present

## 2016-10-31 DIAGNOSIS — J181 Lobar pneumonia, unspecified organism: Secondary | ICD-10-CM | POA: Diagnosis not present

## 2016-10-31 DIAGNOSIS — J3089 Other allergic rhinitis: Secondary | ICD-10-CM

## 2016-10-31 DIAGNOSIS — J329 Chronic sinusitis, unspecified: Secondary | ICD-10-CM | POA: Diagnosis not present

## 2016-10-31 DIAGNOSIS — J189 Pneumonia, unspecified organism: Secondary | ICD-10-CM

## 2016-10-31 NOTE — Progress Notes (Signed)
Dear Dr. Tamala Julian,  Thank you for referring Nathan Kerr to the [facility] on 10/31/2016.   Below is a summation of this patient's evaluation and recommendations.  Thank you for your referral. I will keep you informed about this patient's response to treatment.   If you have any questions please do not hesitate to contact me.   Sincerely,  Jiles Prows, MD Allergy / Immunology [place]   ______________________________________________________________________    NEW PATIENT NOTE  Referring Provider: Wardell Honour, MD Primary Provider: Golden Circle, FNP Date of office visit: 10/31/2016    Subjective:   Chief Complaint:  Nathan Kerr (DOB: Oct 12, 1985) is a 31 y.o. male who presents to the clinic on 10/31/2016 with a chief complaint of Asthma (in and out of hospital ) and Allergy Testing .     HPI: Nathan Kerr presents to this clinic in evaluation of possible allergic disease contributing to his respiratory tract symptoms.  His history dates back about 3 months or so at which point in time he was having episodes of recurrent epistaxis addressed by [facility] ENT with intermittent cautery. He also had some issues with head congestion and a CT scan of his sinuses did identify some component of chronic sinusitis. He states that after his cautery he started to develop problems with very significant swelling of his nasal bridge along with significant pain for which he was treated with prednisone. He is apparently prednisone responsive but whenever his prednisone was tapered he would once again developed significant nasal swelling and pain and epistaxis. He has basically been on systemic steroids mostly on rather than off over during the course of the past 3 months.  Last week he started to develop an issue with shortness of breath and coughing and was evaluated in the emergency room and  subsequently admitted to the hospital and underwent bronchoscopy for a left lower lobe obstruction/mass/infection without identification of a specific etiologic agent contributing to this issue.[name] He was treated empirically with multiple antibiotics and more systemic steroids and once again improved tremendously symptomatically after starting systemic steroids.  Presently he has no symptoms. He does not have any coughing or shortness of breath or head congestion or anosmia or ugly nasal discharge or headaches. He does not have any associated joint pain, abdominal issues, or skin problems.  He does not have a history of childhood allergic disease and does not have a history of developing significant respiratory tract symptoms when being exposed to various aeroallergens. 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  Past Medical History:  Diagnosis Date  . Allergy   . Asthma    Childhood, not currently active  . Chronic myeloid leukemia in remission ([place])   . CML (chronic myelocytic leukemia) ([place])   . Leukemia ([place]) 2010  . Migraine     Past Surgical History:  Procedure Laterality Date  . APPENDECTOMY    . BRONCHOSCOPY    . VIDEO BRONCHOSCOPY Bilateral 10/23/2016   Procedure: VIDEO BRONCHOSCOPY WITHOUT FLUORO;  Surgeon: Rigoberto Noel, MD;  Location: [facility];  Service: Cardiopulmonary;  Laterality: Bilateral;    Allergies as of 10/31/2016   No Known Allergies     Medication List      acetaminophen 500 MG tablet Commonly known as:  TYLENOL Take 1,000 mg by mouth every 6 (six) hours  as needed for mild pain.   benzonatate 100 MG capsule Commonly known as:  TESSALON Take 1 capsule (100 mg total) by mouth daily.   cetirizine 10 MG tablet Commonly known as:  ZYRTEC Take 10 mg by mouth daily.   fluticasone furoate-vilanterol 100-25 MCG/INH Aepb Commonly known as:  BREO  ELLIPTA Inhale 1 puff into the lungs daily.   guaiFENesin 600 MG 12 hr tablet Commonly known as:  MUCINEX Take 1 tablet (600 mg total) by mouth 2 (two) times daily.   ibuprofen 200 MG tablet Commonly known as:  ADVIL,MOTRIN Take 400-600 mg by mouth every 6 (six) hours as needed.   meloxicam 15 MG tablet Commonly known as:  MOBIC Take 1 tablet (15 mg total) by mouth daily. PATIENT NEEDS OFFICE VISIT FOR ADDITIONAL REFILLS   montelukast 10 MG tablet Commonly known as:  SINGULAIR Take 1 tablet (10 mg total) by mouth at bedtime.   nilotinib 150 MG capsule Commonly known as:  TASIGNA Take 2 capsules (300 mg total) by mouth every 12 (twelve) hours.   oxyCODONE-acetaminophen 10-325 MG tablet Commonly known as:  PERCOCET Take 1 tablet by mouth every 6 (six) hours as needed for pain.   predniSONE 10 MG tablet Commonly known as:  DELTASONE Take 1 tablet (10 mg total) by mouth daily with breakfast.   PROAIR HFA 108 (90 Base) MCG/ACT inhaler Generic drug:  albuterol Inhale 2 puffs into the lungs every 4 (four) hours as needed for wheezing.   SUMAtriptan 100 MG tablet Commonly known as:  IMITREX Take 1 tablet at onset of headache. May repeat in 2 hrs if needed, MAX 2 tabs/24 hrs.       Review of systems negative except as noted in HPI / PMHx or noted below:  Review of Systems  Constitutional: Negative.   HENT: Negative.   Eyes: Negative.   Respiratory: Negative.   Cardiovascular: Negative.   Gastrointestinal: Negative.   Genitourinary: Negative.   Musculoskeletal: Negative.   Skin: Negative.   Neurological: Negative.   Endo/Heme/Allergies: Negative.   Psychiatric/Behavioral: Negative.     Family History  Problem Relation Age of Onset  . Sleep apnea Mother   . Migraines Father     Social History   Social History  . Marital status: Married    Spouse name: N/A  . Number of children: 0  . Years of education: 51   Occupational History  . Account Patent attorney for  [employer] History Main Topics  . Smoking status: Never Smoker  . Smokeless tobacco: Never Used  . Alcohol use Yes     Comment: occasional  . Drug use: No  . Sexual activity: Yes    Partners: Female   Other Topics Concern  . Not on file   Social History Narrative   Fun/Hobby: Video games, woodworking, IT sales professional and Social history  Lives in a house with a dry environment, 2 cats located inside the household, no carpeting in the bedroom, no plastic on the bed, no plastic on the pillow, no smoking with inside the household, and employment as an Energy manager in an office setting.  Objective:   Vitals:   10/31/16 1427  BP: 120/80  Pulse: 92  Resp: 20  Temp: 98.6 F (37 C)   Height: 5' 10"  (177.8 cm) Weight: 216 lb 6.4 oz (98.2 kg)  Physical Exam  Constitutional: He is well-developed, well-nourished, and in no distress.  HENT:  Head: Normocephalic. Head is without right periorbital erythema and without left periorbital erythema.  Right Ear: Tympanic membrane, external ear and ear canal normal.  Left Ear: Tympanic membrane, external ear and ear canal normal.  Nose: Nose normal. No mucosal edema or rhinorrhea.  Mouth/Throat: Oropharynx is clear and moist and mucous membranes are normal. No oropharyngeal exudate.  Eyes: Conjunctivae and lids are normal. Pupils are equal, round, and reactive to light.  Neck: Trachea normal. No tracheal deviation present. No thyromegaly present.  Cardiovascular: Normal rate, regular rhythm, S1 normal, S2 normal and normal heart sounds.   No murmur heard. Pulmonary/Chest: Effort normal. No stridor. No tachypnea. No respiratory distress. He has no wheezes. He has no rales. He exhibits no tenderness.  Abdominal: Soft. He exhibits no distension and no mass. There is no hepatosplenomegaly. There is no tenderness. There is no rebound and no guarding.  Musculoskeletal: He exhibits no edema or  tenderness.  Lymphadenopathy:       Head (right side): No tonsillar adenopathy present.       Head (left side): No tonsillar adenopathy present.    He has no cervical adenopathy.    He has no axillary adenopathy.  Neurological: He is alert. Gait normal.  Skin: No rash noted. He is not diaphoretic. No erythema. No pallor. Nails show no clubbing.  Psychiatric: Mood and affect normal.    Diagnostics: Allergy skin tests were performed. He demonstrated hypersensitivity to house dust mite.  Results of blood tests obtained 10/22/2016 identified a white blood cell count of 28.1 with neutrophilia, absolute eosinophil count of 0, lymphocyte count of 2000, hemoglobin 12.9, platelet 111.  Results of blood tests obtained on 10/23/2016 identified a negative ANCA screen  Results of a chest CT scan obtained 10/21/2016 identified the following:  1. Left lower lobe bronchial obstruction and air trapping. The narrowing has a masslike appearance with circumferential luminal soft tissue density. Recommend pulmonary referral for bronchoscopic correlation. 2. Debris layers within the right bronchus intermedius. No generalized airway thickening. 3. Negative for pulmonary embolism.  Results of a sinus CT scan obtained 09/22/2016 identified the following:  SINUSES: Mild RIGHT greater than LEFT maxillary sinus mucosal thickening, moderate LEFT sphenoid sinus mucosal thickening. Patchy mucosal thickening predominately anterior ethmoid air cells. No abnormal antral expansion or atresia, bony wall thickening/mucoperiosteal reaction. Nasal septum is midline. Carotid canals mildly protrude into the posterior sphenoid sinuses with ample bony covering. Included mastoid air cells are well aerated, pneumatized petrous apices.  OSTIA: Soft tissue effaces the RIGHT ostiomeatal unit and LEFT sphenoethmoidal recess.  FACIAL BONES: No acute facial fracture. No destructive bony lesions.  ORBITS: Ocular globes and orbital  contents are non-suspicious by low-dose protocol.  Assessment and Plan:    1. Pneumonia of left lower lobe due to infectious organism ([place])   2. Mass of lower lobe of left lung   3. Chronic sinusitis, unspecified location   4. Other allergic rhinitis     1. Allergen avoidance measures?  2. Continue Flonase, Breo, montelukast, cetirizine, ProAir HFA  3. Continue prednisone  4. Blood - IgA/G/M, anti-pneumococcal antibody, antitetanus antibody, C1 esterase inhibitor level and function, C3, C4, ANA with reflex  5. Further evaluation and treatment?  Although Nathan Kerr may have some component of atopic disease driving respiratory tract inflammation I do not think that his most recent scenario that has developed over the course of the past month is a result of an atopic immune system. Rather, he does have chronic sinusitis and what appears to  be some form of left lower lobe inflammatory mass that appears to be steroid responsive which always suggests the possibility that he may have some form of significant immunological hyperreactivity tied up with vasculitis. As well, his chronic sinusitis certainly suggest the possibility of an inadequate immune systems directed against infectious diseases. He will perform allergen avoidance measures and continue on anti-inflammatory agents for his respiratory tract and continue to undergo his prednisone taper as directed by his pulmonologist and oncologist and I will check blood test to look at immunological diseases and swelling disorders that may be contributing to some of his symptoms. Although his ANCA was negative, with a sensitivity of somewhere between 60-80% in ruling out Wegener's granulomatosis, I don't think that that diagnosis should be excluded at this point in time and it may require a biopsy of involved respiratory tissue to nail down exactly what is going on with his left lower lobe and upper airway. I will not see him back in this clinic as he is  already seeing a multitude of different doctors and I have informed him that he should rely on one doctor to help drive his medical care and I recommended that that doctor be his oncologist.  Jiles Prows, MD [facility]

## 2016-10-31 NOTE — Patient Instructions (Addendum)
  1. Allergen avoidance measures?  2. Continue Flonase, Breo, montelukast, cetirizine, ProAir HFA  3. Continue prednisone  4. Blood - IgA/G/M, anti-pneumococcal antibody, antitetanus antibody, C1 esterase inhibitor level and function, C3, C4, ANA with reflex  5. Further evaluation and treatment?

## 2016-11-07 LAB — ANTI-NUCLEAR AB-TITER (ANA TITER): ANA Titer 1: 1:160 {titer} — ABNORMAL HIGH

## 2016-11-07 LAB — IGG, IGA, IGM
IGA: 193 mg/dL (ref 81–463)
IgG (Immunoglobin G), Serum: 816 mg/dL (ref 694–1618)
IgM, Serum: 150 mg/dL (ref 48–271)

## 2016-11-07 LAB — ANA: Anti Nuclear Antibody(ANA): POSITIVE — AB

## 2016-11-07 LAB — C3 AND C4
C3 Complement: 135 mg/dL (ref 82–185)
C4 COMPLEMENT: 38 mg/dL (ref 15–53)

## 2016-11-08 LAB — C1 ESTERASE INHIBITOR: C1INH SerPl-mCnc: 35 mg/dL (ref 21–39)

## 2016-11-10 LAB — STREP PNEUMONIAE 14 SEROTYPES IGG
STREP PNEUMO TYPE 19: 1.4
STREP PNEUMONIAE TYPE 1 ABS: 0.6
STREP PNEUMONIAE TYPE 14 ABS: 0.5
STREP PNEUMONIAE TYPE 18C ABS: 1.9
STREP PNEUMONIAE TYPE 23F ABS: 0.7
STREP PNEUMONIAE TYPE 5 ABS: 2.7
STREP PNEUMONIAE TYPE 6B ABS: 3.3
Strep pneumo Type 12: <0.3
Strep pneumo Type 4: <0.3
Strep pneumo Type 9: 2.2
Strep pneumoniae Type 3 Abs: 0.8
Strep pneumoniae Type 7F Abs: 1.7
Strep pneumoniae Type 8 Abs: <0.3
Strep pneumoniae Type 9N Abs: 1.2

## 2016-11-12 LAB — C1 ESTERASE INHIBITOR, FUNCTIONAL: C1INH Functional/C1INH Total MFr SerPl: 100 % (ref >=68)

## 2016-11-12 LAB — TETANUS ANTIBODY, IGG: Tetanus Antitoxid Ab: 1.23 IU/mL (ref >0.15)

## 2016-11-13 ENCOUNTER — Telehealth: Payer: Self-pay | Admitting: Pulmonary Disease

## 2016-11-13 NOTE — Telephone Encounter (Signed)
RA  Please Advise-  Pt states when he came in to see you on 10/27/16 you had asked him if he had any rashes and he stated at that time he did not. He states a rash has come up the last couple of days on his forehead,the back of his neck and back. He states it is a little red but denies itching,flaking,burning sensation,or it stinging. He states he is unsure if it came from the prednisone but he states he has been on this before and never had a problem. He does think it could potentially be acme but wanted your opinion of what he should do. Pt is aware that you are unavailable to see this message until tonight and he stated that this was fine

## 2016-11-14 NOTE — Telephone Encounter (Signed)
Could be acne - doubt prednisone causing it Take a picture of it & monitor  - OV if worse I reviewed his visit with dr Neldon Mc

## 2016-11-14 NOTE — Telephone Encounter (Signed)
Spoke with patient. He stated that he will keep on the bumps and notify us if they appear to change. Denied any symptoms of throat tightening. However, he is still having the SOB during activity. Stated that it hasn't gotten worse or better since the last visit.   He wants to know if theres anything he can do to help this? He said that he needs a refill on his Memory Dance but wants to hold if especially if you want change it to something else. Please advise. Thanks!

## 2016-11-15 ENCOUNTER — Telehealth: Payer: Self-pay | Admitting: Pharmacist

## 2016-11-15 DIAGNOSIS — C9210 Chronic myeloid leukemia, BCR/ABL-positive, not having achieved remission: Secondary | ICD-10-CM

## 2016-11-15 MED ORDER — FLUTICASONE FUROATE-VILANTEROL 100-25 MCG/INH IN AEPB: 1.0000 | INHALATION_SPRAY | Freq: Every day | RESPIRATORY_TRACT | 3 refills | Status: DC

## 2016-11-15 MED ORDER — NILOTINIB HCL 150 MG PO CAPS: 300.0000 mg | ORAL_CAPSULE | Freq: Two times a day (BID) | ORAL | 0 refills | Status: DC

## 2016-11-15 MED FILL — TASIGNA 150 MG CAPSULE: 150 | 84 days supply | Qty: 336 | Fill #0

## 2016-11-15 NOTE — Telephone Encounter (Signed)
Oral Chemotherapy Pharmacist Encounter   I spoke with patient about oral chemotherapy medication: Tasigna.   Patient is not new to treatment, originally started on Tasigna treatment in August 2011.   He had reached out to the office with concerns about his Tasigna needing prior authorization due to a new prescription insurance provider. Patient stated the pharmacy had reached out to the office on Monday 7/9 without return call. There is no documentation in Epic that a call for request for prior authorization had ben received.  Prior authorization submitted, status is pending. Key [id number]  In the meantime, we will use 90-day voucher from the manufacturer for Tasigna fill to come from the [facility] to prevent treatment delay while we obtain insurance authorization.  Counseled patient on administration, dosing, side effects, safe handling, and monitoring. Patient is taking Tasigna 150mg  capsule 2 by moth (300mg  total) two times daily on an empty stomach. Side effects include but not limited to: fatigue, pyrexia, GI upset, diarrhea, and rash.  Nathan Kerr voiced understanding and appreciation.   All questions answered.  Will follow up with patient regarding insurance and pharmacy.   Oral Oncology Clinic will continue to follow.   Thank you,  [name], PharmD, BCPS, BCOP 11/15/2016  1:21 PM Oral Oncology Clinic [phone]

## 2016-11-15 NOTE — Telephone Encounter (Signed)
OK to continue [facility] for now

## 2016-11-15 NOTE — Telephone Encounter (Signed)
Pt is aware. RX for Memory Dance has been called in. Nothing else needed at time of call.

## 2016-11-16 ENCOUNTER — Other Ambulatory Visit: Payer: Self-pay

## 2016-11-16 DIAGNOSIS — R799 Abnormal finding of blood chemistry, unspecified: Secondary | ICD-10-CM

## 2016-11-16 NOTE — Telephone Encounter (Signed)
Oral Oncology Patient Advocate Encounter  Prior Authorization for Rae Lips has been approved.    Effective dates: 11/15/2016 through 11/15/2017.   Additionally, 90 days worth of medication (at no charge to the patient) will be ready for pickup at [facility] this afternoon.    Further prescription refills are required to be filled at [facility] per the patient's insurance plan.     I left a message for the patient with the good news.   Oral Oncology Clinic will continue to follow.   Fabio Asa. Melynda Keller, [facility] Oral Oncology Patient Advocate [phone] 11/16/2016 10:06 AM

## 2016-11-22 ENCOUNTER — Telehealth: Payer: Self-pay

## 2016-11-22 ENCOUNTER — Other Ambulatory Visit: Payer: Self-pay | Admitting: Allergy and Immunology

## 2016-11-22 ENCOUNTER — Ambulatory Visit: Payer: Managed Care, Other (non HMO) | Admitting: Allergy and Immunology

## 2016-11-22 MED ORDER — CLINDAMYCIN PHOSPHATE 1 % EX LOTN: TOPICAL_LOTION | Freq: Two times a day (BID) | CUTANEOUS | 0 refills | Status: DC

## 2016-11-22 NOTE — Telephone Encounter (Signed)
The prescription has been sent to patient's pharmacy.

## 2016-11-22 NOTE — Telephone Encounter (Signed)
-----   Message from Jiles Prows, MD sent at 11/22/2016  8:56 AM EDT ----- Nathan Kerr presents today with a rash on his upper back, chest, face, and scalp that has been slowly developing the past several weeks. His exam was consistent with steroid induced acne. Start Clindamycin 1% lotion applied two times a day continuously while on oral steroids. Please send prescription (big volume) and save this note.

## 2016-11-23 LAB — FUNGAL ORGANISM REFLEX

## 2016-11-23 LAB — ANTI-NUCLEAR AB-TITER (ANA TITER)

## 2016-11-23 LAB — FUNGUS CULTURE WITH STAIN

## 2016-11-23 LAB — ANA: ANA: POSITIVE — AB

## 2016-11-23 LAB — FUNGUS CULTURE RESULT

## 2016-11-24 LAB — ANACHOICE SPEC AB CASCADING REFLEX: Anti Nuclear Antibody(ANA): NEGATIVE

## 2016-11-27 ENCOUNTER — Encounter: Payer: Self-pay | Admitting: Pulmonary Disease

## 2016-11-27 ENCOUNTER — Ambulatory Visit (INDEPENDENT_AMBULATORY_CARE_PROVIDER_SITE_OTHER): Payer: Managed Care, Other (non HMO) | Admitting: Pulmonary Disease

## 2016-11-27 DIAGNOSIS — J45901 Unspecified asthma with (acute) exacerbation: Secondary | ICD-10-CM

## 2016-11-27 DIAGNOSIS — R918 Other nonspecific abnormal finding of lung field: Secondary | ICD-10-CM | POA: Diagnosis not present

## 2016-11-27 NOTE — Assessment & Plan Note (Signed)
Try decreasing prednisone to 15 mg per day for 2 weeks and if symptoms better, drop to 10 mg after and stay At this dose   Stay on [facility]

## 2016-11-27 NOTE — Progress Notes (Signed)
   Subjective:    Patient ID: Nathan Kerr, male    DOB: 1985/06/01, 31 y.o.   MRN: [id number]  HPI  31 year old male with chronic myelocytic anemia in remission for FU of  cough and shortness of breath, recurrent sinusitis and left lower lobe nodule  Bronchoscopy Showed extrinsic compression of left lower lobe bronchus & involving bronchial wall Bronchial brushings & biopsies negative , LLL appeared open & narrowing could be traversed with forceps. Cultures all negative including AFB and fungal  Chief Complaint  Patient presents with  . Follow-up    4 wk f/u. Breathing has not improved since the last visit. Blood nose has returned.    He was unable to decrease his prednisone to be on 20 mg because his symptoms Worse. He continues to have daily headache, occasional wheezing nasal congestion and epistaxis and chest congestion  He had seen ENT 09/2016, head CT was negative  He saw allergist- Kozlow, ANA was +1: 160, repeated He denies joint pains. He had bumps on his back and is neck but they're resolved now. He also reports acne over his forehead and scalp and some hair loss     Significant tests/ events reviewed  10/2016  CT chest>> in the left lower lobe there is airway narrowing which appears to be due to extrinsic compression from a soft tissue density. There is some air-trapping in the left lower lobe.  ANA 1: 160 + ANCA neg  Review of Systems neg for any significant sore throat, dysphagia, itching, sneezing, nasal congestion or excess/ purulent secretions, fever, chills, sweats, unintended wt loss, pleuritic or exertional cp, hempoptysis, orthopnea pnd or change in chronic leg swelling. Also denies presyncope, palpitations, heartburn, abdominal pain, nausea, vomiting, diarrhea or change in bowel or urinary habits, dysuria,hematuria, rash, arthralgias, visual complaints, headache, numbness weakness or ataxia.     Objective:   Physical Exam  Gen. Pleasant, well-nourished,  in no distress ENT - no thrush, no post nasal drip, Mild upper airway wheezing Neck: No JVD, no thyromegaly, no carotid bruits Lungs: no use of accessory muscles, no dullness to percussion, clear without rales or rhonchi  Cardiovascular: Rhythm regular, heart sounds  normal, no murmurs or gallops, no peripheral edema Musculoskeletal: No deformities, no cyanosis or clubbing        Assessment & Plan:

## 2016-11-27 NOTE — Patient Instructions (Signed)
Try decreasing prednisone to 15 mg per day for 2 weeks and if symptoms better, drop to 10 mg after and stay At this dose   PET scan scheduled for 8/22  Call me if symptoms worsen we can get this sooner   Stay on [facility]

## 2016-11-27 NOTE — Assessment & Plan Note (Signed)
Unable to explain all his symptoms by a single diagnosis, ANCA negative noted, weakly positive ANA - does not want a rheumatology appointment at this point  PET scan scheduled for 8/22  Call me if symptoms worsen we can get this sooner

## 2016-11-30 ENCOUNTER — Other Ambulatory Visit: Payer: Self-pay

## 2016-11-30 MED ORDER — AMOXICILLIN-POT CLAVULANATE 875-125 MG PO TABS: 1.0000 | ORAL_TABLET | Freq: Two times a day (BID) | ORAL | 0 refills | Status: DC

## 2016-11-30 MED ORDER — AMOXICILLIN-POT CLAVULANATE 875-125 MG PO TABS
1.0000 | ORAL_TABLET | Freq: Two times a day (BID) | ORAL | 0 refills | Status: DC
Start: 2016-11-30 — End: 2016-12-25

## 2016-11-30 NOTE — Addendum Note (Signed)
Addended by: Martyn Malay on: 11/30/2016 04:53 PM   Modules accepted: Orders

## 2016-12-04 ENCOUNTER — Ambulatory Visit: Payer: Managed Care, Other (non HMO) | Admitting: Pulmonary Disease

## 2016-12-07 ENCOUNTER — Telehealth: Payer: Self-pay | Admitting: Pulmonary Disease

## 2016-12-07 DIAGNOSIS — R918 Other nonspecific abnormal finding of lung field: Secondary | ICD-10-CM

## 2016-12-07 LAB — ACID FAST CULTURE WITH REFLEXED SENSITIVITIES (MYCOBACTERIA): Acid Fast Culture: NEGATIVE

## 2016-12-07 NOTE — Telephone Encounter (Signed)
Please let him know that insurance denied his PET scan I spoke to Dr. at his insurance company in a peer- peer  conversation We agreed to proceed with CT chest with IV contrast and fifth lung mass is still present then only, we will proceed with PET scan Please order CT chest with contrast

## 2016-12-07 NOTE — Telephone Encounter (Signed)
Spoke with patient. He is aware of the situation. He has agreed to the CT chest with contrast. He wanted to know if the CT could be done the same day that the PET was scheduled. Advised him that I would write that in the order to see if it was possible.

## 2016-12-07 NOTE — Telephone Encounter (Signed)
-----   Message from Joellen Jersey sent at 12/04/2016  9:07 AM EDT ----- Insurance denied his PET Scan you can do peer to peer [phone] I have given Cherina the paperwork thanks

## 2016-12-18 ENCOUNTER — Telehealth: Payer: Self-pay | Admitting: Pulmonary Disease

## 2016-12-18 MED ORDER — PREDNISONE 10 MG PO TABS: 20.0000 mg | ORAL_TABLET | Freq: Every day | ORAL | 2 refills | Status: DC

## 2016-12-18 NOTE — Telephone Encounter (Signed)
Called and spoke to pt. Pt is requesting a refill of prednisone. Pt states he ran out of this on Friday and has been out of pred over the weekend. Per pt's last OV note  RA suggested to decrease the pred to 15mg . Pt states he was unable to maintain at 15mg  so he is now back to 20mg  daily. Rx refill sent to preferred pharmacy.   Will forward to RA as FYI.

## 2016-12-18 NOTE — Telephone Encounter (Signed)
Patient called again following up on his request for refill of prednisone. Patient stated he is having sob, wheezing, and headaches.

## 2016-12-22 ENCOUNTER — Telehealth: Payer: Self-pay | Admitting: Pulmonary Disease

## 2016-12-22 ENCOUNTER — Ambulatory Visit: Payer: Managed Care, Other (non HMO) | Admitting: Pulmonary Disease

## 2016-12-22 NOTE — Telephone Encounter (Signed)
Called and spoke with pt letting him know that we were needing to cancel his appt he had scheduled with RA today due to needing him to come back to see [place] after his CT scan that is scheduled 12/27/16. Told pt that we would get with RA about when he wanted the pt to come back in to see [place] after his CT scan. Pt expressed understanding. Nothing further needed at this time.

## 2016-12-25 ENCOUNTER — Encounter: Payer: Self-pay | Admitting: Family

## 2016-12-25 ENCOUNTER — Ambulatory Visit (INDEPENDENT_AMBULATORY_CARE_PROVIDER_SITE_OTHER): Payer: Managed Care, Other (non HMO) | Admitting: Family

## 2016-12-25 VITALS — BP 116/80 | HR 80 | Temp 98.5°F | Resp 16 | Ht 70.0 in | Wt 224.0 lb

## 2016-12-25 DIAGNOSIS — R51 Headache: Secondary | ICD-10-CM

## 2016-12-25 DIAGNOSIS — R519 Headache, unspecified: Secondary | ICD-10-CM | POA: Insufficient documentation

## 2016-12-25 MED ORDER — ACETAMINOPHEN-CODEINE #3 300-30 MG PO TABS: 1.0000 | ORAL_TABLET | ORAL | 0 refills | Status: DC | PRN

## 2016-12-25 NOTE — Progress Notes (Signed)
Subjective:    Patient ID: Nathan Kerr, male    DOB: 04/04/1986, 31 y.o.   MRN: [id number]  Chief Complaint  Patient presents with  . Headache    having daily headaches around the same time every day, has been going for months     HPI:  Nathan Kerr is a 31 y.o. 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 and presents today for an acute office visit.  This is a new problem. Associated symptom of headaches that occur around the same time every day has been going on for several months. Completed cauterization for nose bleed and continues to have pressure sensation. Ears are constantly popping. Has been on prednisone which doses of 40-60 mg help. Continues to experience nasal discharge with blood in it on occasion. No fevers. Has been seen by Dr. Redmond Baseman who performed the cauterization. The headaches generally started after the cauterization. Headaches are described as sinus type headaches with squeezing and occasional throbbing. No sensitivity to light or sound and states this is different from the migraines. Currently maintained on 20 mg of prednisone daily. Has tried being patient and tried multiple antibiotics including Augmentin, Levoquin, and Clindamycin. Severity is enough to effect his life.   No Known Allergies    Outpatient Medications Prior to Visit  Medication Sig Dispense Refill  . acetaminophen (TYLENOL) 500 MG tablet Take 1,000 mg by mouth every 6 (six) hours as needed for mild pain.    . cetirizine (ZYRTEC) 10 MG tablet Take 10 mg by mouth daily.    . clindamycin (CLEOCIN T) 1 % lotion Apply topically 2 (two) times daily. While on steroids. 60 mL 0  . fluticasone furoate-vilanterol (BREO ELLIPTA) 100-25 MCG/INH AEPB Inhale 1 puff into the lungs daily. 1 each 3  . ibuprofen (ADVIL,MOTRIN) 200 MG tablet Take 400-600 mg by mouth every 6 (six)  hours as needed.    . meloxicam (MOBIC) 15 MG tablet Take 1 tablet (15 mg total) by mouth daily. PATIENT NEEDS OFFICE VISIT FOR ADDITIONAL REFILLS 30 tablet 3  . montelukast (SINGULAIR) 10 MG tablet Take 1 tablet (10 mg total) by mouth at bedtime. 30 tablet 0  . nilotinib (TASIGNA) 150 MG capsule Take 2 capsules (300 mg total) by mouth every 12 (twelve) hours. 360 capsule 0  . predniSONE (DELTASONE) 10 MG tablet Take 2 tablets (20 mg total) by mouth daily with breakfast. 60 tablet 2  . PROAIR HFA 108 (90 Base) MCG/ACT inhaler Inhale 2 puffs into the lungs every 4 (four) hours as needed for wheezing. 1 Inhaler 1  . rizatriptan (MAXALT-MLT) 10 MG disintegrating tablet 10 mg.    . SUMAtriptan (IMITREX) 100 MG tablet Take 1 tablet at onset of headache. May repeat in 2 hrs if needed, MAX 2 tabs/24 hrs. 9 tablet 5  . amoxicillin-clavulanate (AUGMENTIN) 875-125 MG tablet Take 1 tablet by mouth 2 (two) times daily. For the next 20 days. 40 tablet 0  . nilotinib (TASIGNA) 150 MG capsule Take 2 capsules (300 mg total) by mouth every 12 (twelve) hours. 460 capsule 2  . oxyCODONE-acetaminophen (PERCOCET) 10-325 MG tablet Take 1 tablet by mouth every 6 (six) hours as needed for pain. 30 tablet 0   Facility-Administered Medications Prior to Visit  Medication Dose Route Frequency Provider Last Rate Last Dose  . ondansetron (ZOFRAN-ODT) disintegrating tablet 8 mg  8 mg Oral Once [place],  Linton Ham, MD          Past Surgical History:  Procedure Laterality Date  . APPENDECTOMY    . BRONCHOSCOPY    . VIDEO BRONCHOSCOPY Bilateral 10/23/2016   Procedure: VIDEO BRONCHOSCOPY WITHOUT FLUORO;  Surgeon: Rigoberto Noel, MD;  Location: [facility];  Service: Cardiopulmonary;  Laterality: Bilateral;      Past Medical History:  Diagnosis Date  . Allergy   . Asthma    Childhood, not currently active  . 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 CML (chronic myelocytic leukemia) ([place])   . Leukemia ([place])  2010  . Migraine       Review of Systems  Constitutional: Negative for chills and fever.  HENT: Positive for nosebleeds (occasional blood tinged sputum), sinus pain and sinus pressure. Negative for congestion, sneezing, sore throat and tinnitus.   Respiratory: Negative for chest tightness and shortness of breath.   Cardiovascular: Negative for chest pain, palpitations and leg swelling.  Neurological: Positive for headaches. Negative for dizziness, weakness, light-headedness and numbness.      Objective:    BP 116/80 (BP Location: Left Arm, Patient Position: Sitting, Cuff Size: Large)   Pulse 80   Temp 98.5 F (36.9 C) (Oral)   Resp 16   Ht 5\' 10"  (1.778 m)   Wt 224 lb (101.6 kg)   SpO2 96%   BMI 32.14 kg/m  Nursing note and vital signs reviewed.  Physical Exam  Constitutional: He is oriented to person, place, and time. He appears well-developed and well-nourished. No distress.  HENT:  Right Ear: Hearing, tympanic membrane, external ear and ear canal normal.  Left Ear: Hearing, tympanic membrane, external ear and ear canal normal.  Nose: No mucosal edema, rhinorrhea, nose lacerations, sinus tenderness, nasal deformity, septal deviation or nasal septal hematoma. No epistaxis.  No foreign bodies. Right sinus exhibits maxillary sinus tenderness. Right sinus exhibits no frontal sinus tenderness. Left sinus exhibits maxillary sinus tenderness. Left sinus exhibits no frontal sinus tenderness.  Cardiovascular: Normal rate, regular rhythm, normal heart sounds and intact distal pulses.   Pulmonary/Chest: Effort normal and breath sounds normal.  Neurological: He is alert and oriented to person, place, and time.  Skin: Skin is warm and dry.  Psychiatric: He has a normal mood and affect. His behavior is normal. Judgment and thought content normal.       Assessment & Plan:   Problem List Items Addressed This Visit      Other   Sinus headache - Primary    Symptoms and exam are  consistent with sinus headaches related to increased pressure going on since cauterization. Unlikely infectious as he has completed several courses of antibiotics. Prednisone does help at higher dosages with current attempts to wean him down. Not likely related to decreasing prednisone. Does continue to have some blood tinged sputum. Previous CT of the sinus with no significant findings for his symptoms. Refer to neurology and new ENT per patient request. Neurological exam today is normal. Consider MRI for further imaging. Start Tylenol #3 as needed for pain control. Denies worst headache of life. Continue to monitor pending referrals.       Relevant Medications   acetaminophen-codeine (TYLENOL #3) 300-30 MG tablet   Other Relevant Orders   Ambulatory referral to ENT   Ambulatory referral to Neurology       I have discontinued [name]'s oxyCODONE-acetaminophen and amoxicillin-clavulanate. I am also having him start on acetaminophen-codeine. Additionally, I am having him maintain his  SUMAtriptan, meloxicam, cetirizine, ibuprofen, acetaminophen, montelukast, PROAIR HFA, nilotinib, fluticasone furoate-vilanterol, clindamycin, rizatriptan, and predniSONE. We will continue to administer ondansetron.   Meds ordered this encounter  Medications  . acetaminophen-codeine (TYLENOL #3) 300-30 MG tablet    Sig: Take 1 tablet by mouth every 4 (four) hours as needed for moderate pain.    Dispense:  30 tablet    Refill:  0    Order Specific Question:   Supervising Provider    Answer:   Pricilla Holm A [[id number]]     Follow-up: Return if symptoms worsen or fail to improve.  Nathan Po, FNP

## 2016-12-25 NOTE — Patient Instructions (Signed)
Thank you for choosing [facility].  SUMMARY AND INSTRUCTIONS:  Referral sent to Neurology and ENT.   Start the Tylenol #3 as needed.    Medication:  Your prescription(s) have been submitted to your pharmacy or been printed and provided for you. Please take as directed and contact our office if you believe you are having problem(s) with the medication(s) or have any questions.  Follow up:  If your symptoms worsen or fail to improve, please contact our office for further instruction, or in case of emergency go directly to the emergency room at the closest medical facility.    Sinus Headache A sinus headache happens when your sinuses become clogged or swollen. You may feel pain or pressure in your face, forehead, ears, or upper teeth. Sinus headaches can be mild or severe. Follow these instructions at home:  Take medicines only as told by your doctor.  If you were given an antibiotic medicine, finish all of it even if you start to feel better.  Use a nose spray if you feel stuffed up (congested).  If told, apply a warm, moist washcloth to your face to help lessen pain. Contact a doctor if:  You get headaches more than one time each week.  Light or sound bothers you.  You have a fever.  You feel sick to your stomach (nauseous) or you throw up (vomit).  Your headaches do not get better with treatment. Get help right away if:  You have trouble seeing.  You suddenly have very bad pain in your face or head.  You start to twitch or shake (seizure).  You are confused.  You have a stiff neck. This information is not intended to replace advice given to you by your health care provider. Make sure you discuss any questions you have with your health care provider. Document Released: 08/24/2010 Document Revised: 12/19/2015 Document Reviewed: 04/20/2014 Elsevier Interactive Patient Education  [employer].

## 2016-12-25 NOTE — Assessment & Plan Note (Signed)
Symptoms and exam are consistent with sinus headaches related to increased pressure going on since cauterization. Unlikely infectious as he has completed several courses of antibiotics. Prednisone does help at higher dosages with current attempts to wean him down. Not likely related to decreasing prednisone. Does continue to have some blood tinged sputum. Previous CT of the sinus with no significant findings for his symptoms. Refer to neurology and new ENT per patient request. Neurological exam today is normal. Consider MRI for further imaging. Start Tylenol #3 as needed for pain control. Denies worst headache of life. Continue to monitor pending referrals.

## 2016-12-27 ENCOUNTER — Encounter (HOSPITAL_COMMUNITY): Payer: Self-pay

## 2016-12-27 ENCOUNTER — Ambulatory Visit (HOSPITAL_COMMUNITY): Payer: Managed Care, Other (non HMO)

## 2016-12-27 ENCOUNTER — Ambulatory Visit (HOSPITAL_COMMUNITY)
Admission: RE | Admit: 2016-12-27 | Discharge: 2016-12-27 | Disposition: A | Discharge status: Home / Self Care | Payer: Managed Care, Other (non HMO) | Source: Ambulatory Visit | Attending: Pulmonary Disease | Admitting: Pulmonary Disease

## 2016-12-27 DIAGNOSIS — R918 Other nonspecific abnormal finding of lung field: Secondary | ICD-10-CM | POA: Diagnosis present

## 2016-12-27 MED ORDER — IOPAMIDOL (ISOVUE-300) INJECTION 61%
INTRAVENOUS | Status: AC
  Administered 2016-12-27: 75 mL via INTRAVENOUS
  Filled 2016-12-27: qty 75

## 2016-12-27 MED ORDER — IOPAMIDOL (ISOVUE-300) INJECTION 61%
75.0000 mL | Freq: Once | INTRAVENOUS | Status: AC | PRN
  Administered 2016-12-27: 75 mL via INTRAVENOUS

## 2017-01-01 ENCOUNTER — Ambulatory Visit (INDEPENDENT_AMBULATORY_CARE_PROVIDER_SITE_OTHER): Payer: Managed Care, Other (non HMO) | Admitting: Diagnostic Neuroimaging

## 2017-01-01 ENCOUNTER — Encounter: Payer: Self-pay | Admitting: Diagnostic Neuroimaging

## 2017-01-01 VITALS — BP 120/70 | HR 76 | Ht 70.5 in | Wt 226.0 lb

## 2017-01-01 DIAGNOSIS — R519 Headache, unspecified: Secondary | ICD-10-CM

## 2017-01-01 DIAGNOSIS — R51 Headache: Secondary | ICD-10-CM

## 2017-01-01 MED ORDER — RIZATRIPTAN BENZOATE 10 MG PO TBDP: 10.0000 mg | ORAL_TABLET | ORAL | 11 refills | Status: DC | PRN

## 2017-01-01 MED ORDER — TOPIRAMATE 50 MG PO TABS: 50.0000 mg | ORAL_TABLET | Freq: Two times a day (BID) | ORAL | 12 refills | Status: DC

## 2017-01-01 NOTE — Progress Notes (Signed)
[facility]  PATIENT: Nathan Kerr DOB: [birth year]  REFERRING CLINICIAN: Toniann Ket, FNP HISTORY FROM: patient  REASON FOR VISIT: new consult    HISTORICAL  CHIEF COMPLAINT:  Chief Complaint  Patient presents with  . Sinus headache    rm 6, New Pt, "cauterization for nosebleed; headaches daily ever since, headache wraps around my upper head, ears clicking, hx migraines"    HISTORY OF PRESENT ILLNESS:   31 year old male here for evaluation of headaches. Patient has history of CML in remission, allergies, asthma.  6 months ago patient was having nosebleeds, went to ENT, had cauterization procedure. There is a time he has been having increasing headaches. Now patient having almost daily headaches. No other specific triggering factors. He has ear popping sensation and other symptoms. Headaches significantly affecting him. Patient takes some medication for this at least 4 times per week.  Age 32 years old patient had diagnosis of migraine headache. These improved over time. However they worsened in 2014, when he was averaging 10-20 headaches per year. He has some photophobia. No phonophobia. Some nausea. He does have motion sensitivity.     REVIEW OF SYSTEMS: Full 14 system review of systems performed and negative with exception of: Restless legs headache eye pain shortness of breath cough wheezing snoring.   ALLERGIES: No Known Allergies  HOME MEDICATIONS: Outpatient Medications Prior to Visit  Medication Sig Dispense Refill  . acetaminophen (TYLENOL) 500 MG tablet Take 1,000 mg by mouth every 6 (six) hours as needed for mild pain.    [name] acetaminophen-codeine (TYLENOL #3) 300-30 MG tablet Take 1 tablet by mouth every 4 (four) hours as needed for moderate pain. 30 tablet 0  . fluticasone furoate-vilanterol (BREO ELLIPTA) 100-25 MCG/INH AEPB Inhale 1 puff into the lungs daily. 1 each 3  . ibuprofen (ADVIL,MOTRIN) 200 MG tablet Take 400-600 mg by mouth every 6  (six) hours as needed.    . meloxicam (MOBIC) 15 MG tablet Take 1 tablet (15 mg total) by mouth daily. PATIENT NEEDS OFFICE VISIT FOR ADDITIONAL REFILLS 30 tablet 3  . nilotinib (TASIGNA) 150 MG capsule Take 2 capsules (300 mg total) by mouth every 12 (twelve) hours. 360 capsule 0  . predniSONE (DELTASONE) 10 MG tablet Take 2 tablets (20 mg total) by mouth daily with breakfast. 60 tablet 2  . PROAIR HFA 108 (90 Base) MCG/ACT inhaler Inhale 2 puffs into the lungs every 4 (four) hours as needed for wheezing. 1 Inhaler 1  . rizatriptan (MAXALT-MLT) 10 MG disintegrating tablet 10 mg.    . SUMAtriptan (IMITREX) 100 MG tablet Take 1 tablet at onset of headache. May repeat in 2 hrs if needed, MAX 2 tabs/24 hrs. 9 tablet 5  . cetirizine (ZYRTEC) 10 MG tablet Take 10 mg by mouth daily.    . clindamycin (CLEOCIN T) 1 % lotion Apply topically 2 (two) times daily. While on steroids. (Patient not taking: Reported on 01/01/2017) 60 mL 0  . montelukast (SINGULAIR) 10 MG tablet Take 1 tablet (10 mg total) by mouth at bedtime. (Patient not taking: Reported on 01/01/2017) 30 tablet 0   Facility-Administered Medications Prior to Visit  Medication Dose Route Frequency Provider Last Rate Last Dose  . ondansetron (ZOFRAN-ODT) disintegrating tablet 8 mg  8 mg Oral Once Leandrew Koyanagi, MD        PAST MEDICAL HISTORY: Past Medical History:  Diagnosis Date  . Allergy   . Asthma    Childhood, not currently active  . Chronic myeloid leukemia  in remission ([place])   . CML (chronic myelocytic leukemia) ([place])   . Leukemia ([place]) 2010  . Migraine     PAST SURGICAL HISTORY: Past Surgical History:  Procedure Laterality Date  . APPENDECTOMY    . BRONCHOSCOPY    . VIDEO BRONCHOSCOPY Bilateral 10/23/2016   Procedure: VIDEO BRONCHOSCOPY WITHOUT FLUORO;  Surgeon: Rigoberto Noel, MD;  Location: [facility];  Service: Cardiopulmonary;  Laterality: Bilateral;    FAMILY HISTORY: Family History  Problem Relation Age of  Onset  . Sleep apnea Mother   . Migraines Father     SOCIAL HISTORY:  Social History   Social History  . Marital status: Married    Spouse name: N/A  . Number of children: 0  . Years of education: 61   Occupational History  .      sales for [employer]   Social History Main Topics  . Smoking status: Never Smoker  . Smokeless tobacco: Never Used     Comment: occas cigar  . Alcohol use Yes     Comment: very occasional whiskey  . Drug use: No  . Sexual activity: Yes    Partners: Female   Other Topics Concern  . Not on file   Social History Narrative   Lives with wwife   Fun/Hobby: Video games, woodworking, gardening   Caffeine- coffee, 1-2 cups daily, occas soda and tea     PHYSICAL EXAM  GENERAL EXAM/CONSTITUTIONAL: Vitals:  Vitals:   01/01/17 1330  BP: 120/70  Pulse: 76  Weight: 226 lb (102.5 kg)  Height: 5' 10.5" (1.791 m)    Wt Readings from Last 3 Encounters:  01/01/17 226 lb (102.5 kg)  12/25/16 224 lb (101.6 kg)  11/27/16 220 lb (99.8 kg)       Body mass index is 31.97 kg/m.  Visual Acuity Screening   Right eye Left eye Both eyes  Without correction: 20/20 20/20   With correction:        Patient is in no distress; well developed, nourished and groomed; neck is supple  CARDIOVASCULAR:  Examination of carotid arteries is normal; no carotid bruits  Regular rate and rhythm, no murmurs  Examination of peripheral vascular system by observation and palpation is normal  EYES:  Ophthalmoscopic exam of optic discs and posterior segments is normal; no papilledema or hemorrhages  MUSCULOSKELETAL:  Gait, strength, tone, movements noted in Neurologic exam below  NEUROLOGIC: MENTAL STATUS:  No flowsheet data found.  awake, alert, oriented to person, place and time  recent and remote memory intact  normal attention and concentration  language fluent, comprehension intact, naming intact,   fund of knowledge appropriate  CRANIAL  NERVE:   2nd - no papilledema on fundoscopic exam; SLIGHTLY PHOTOSENSITIVE  2nd, 3rd, 4th, 6th - pupils equal and reactive to light, visual fields full to confrontation, extraocular muscles intact, no nystagmus  5th - facial sensation symmetric  7th - facial strength symmetric  8th - hearing intact  9th - palate elevates symmetrically, uvula midline  11th - shoulder shrug symmetric  12th - tongue protrusion midline  MOTOR:   normal bulk and tone, full strength in the BUE, BLE  SENSORY:   normal and symmetric to light touch, temperature, vibration  COORDINATION:   finger-nose-finger, fine finger movements normal  REFLEXES:   deep tendon reflexes present and symmetric  GAIT/STATION:   narrow based gait    DIAGNOSTIC DATA (LABS, IMAGING, TESTING) - I reviewed patient records, labs, notes, testing and imaging myself  where available.  Lab Results  Component Value Date   WBC 33.8 (H) 10/23/2016   HGB 12.0 (L) 10/23/2016   HCT 38.1 (L) 10/23/2016   MCV 73.6 (L) 10/23/2016   PLT 116 (L) 10/23/2016      Component Value Date/Time   NA 141 10/23/2016 0357   NA 139 09/13/2016 0935   K 3.7 10/23/2016 0357   K 4.0 09/13/2016 0935   CL 107 10/23/2016 0357   CO2 26 10/23/2016 0357   CO2 23 09/13/2016 0935   GLUCOSE 138 (H) 10/23/2016 0357   GLUCOSE 95 09/13/2016 0935   BUN 18 10/23/2016 0357   BUN 16.7 09/13/2016 0935   CREATININE 0.86 10/23/2016 0357   CREATININE 1.0 09/13/2016 0935   CALCIUM 8.5 (L) 10/23/2016 0357   CALCIUM 9.7 09/13/2016 0935   PROT 7.0 10/22/2016 0403   PROT 7.9 09/13/2016 0935   ALBUMIN 3.5 10/22/2016 0403   ALBUMIN 3.8 09/13/2016 0935   AST 17 10/22/2016 0403   AST 13 09/13/2016 0935   ALT 69 (H) 10/22/2016 0403   ALT 64 (H) 09/13/2016 0935   ALKPHOS 77 10/22/2016 0403   ALKPHOS 103 09/13/2016 0935   BILITOT 0.4 10/22/2016 0403   BILITOT 0.72 09/13/2016 0935   GFRNONAA >60 10/23/2016 0357   GFRAA >60 10/23/2016 0357   No  results found for: CHOL, HDL, LDLCALC, LDLDIRECT, TRIG, CHOLHDL No results found for: HGBA1C No results found for: VITAMINB12 Lab Results  Component Value Date   TSH 3.290 07/05/2016    03/16/13 CT head [I reviewed images myself and agree with interpretation. -VRP]  - No acute intracranial pathology.  12/27/16 CT chest  1. Left perihilar mass narrowing the left lower lobe bronchus is slightly decreased in size in the interval. There is continued narrowing of the left lower lobe bronchus. 2. Bilateral pulmonary nodules are identified and progressed from previous exam.     ASSESSMENT AND PLAN  31 y.o. year old male here with history of CML, allergy, asthma, here for evaluation of increasing headaches. Patient does have longer history of migraine headaches, but now having change in headache frequency and intensity. Will proceed with further workup for further evaluation.   Ddx: migraine without aura vs other secondary headache (infx, inflamm, vascular, mass)  1. Nonintractable episodic headache, unspecified headache type      PLAN:  - check MRI brain (rule out mass or other cause of headache) - start topiramate 50mg  at bedtime; after 1 week increase to twice a day - continue rizatriptan - may return for migraine cocktail infusion later this week if needed - consider sleep study in future  Orders Placed This Encounter  Procedures  . MR BRAIN W WO CONTRAST   Meds ordered this encounter  Medications  . topiramate (TOPAMAX) 50 MG tablet    Sig: Take 1 tablet (50 mg total) by mouth 2 (two) times daily.    Dispense:  60 tablet    Refill:  12  . rizatriptan (MAXALT-MLT) 10 MG disintegrating tablet    Sig: Take 1 tablet (10 mg total) by mouth as needed for migraine. May repeat in 2 hours if needed    Dispense:  9 tablet    Refill:  11   Return in about 3 months (around 04/03/2017).    Penni Bombard, MD 2/94/7654, 6:50 PM Certified in Neurology, Neurophysiology and  Neuroimaging  [facility] [address], [place], [place] [address] [phone]

## 2017-01-01 NOTE — Patient Instructions (Addendum)
Thank you for coming to see [place] at [facility]. I hope we have been able to provide you high quality care today.  You may receive a patient satisfaction survey over the next few weeks. We would appreciate your feedback and comments so that we may continue to improve ourselves and the health of our patients.  - start topiramate 79m at bedtime; after 1 week increase to twice a day; drink plenty of water  - continue rizatriptan as needed   - check MRI brain  - may return for migraine cocktail infusion later this week if needed  - consider sleep study in future  - To prevent or relieve headaches, try the following:   Cool Compress. Lie down and place a cool compress on your head.   Avoid headache triggers. If certain foods or odors seem to have triggered your migraines in the past, avoid them. A headache diary might help you identify triggers.   Include physical activity in your daily routine.   Manage stress. Find healthy ways to cope with the stressors, such as delegating tasks on your to-do list.   Practice relaxation techniques. Try deep breathing, yoga, massage and visualization.   Eat regularly. Eating regularly scheduled meals and maintaining a healthy diet might help prevent headaches. Also, drink plenty of fluids.   Follow a regular sleep schedule. Sleep deprivation might contribute to headaches  Consider biofeedback. With this mind-body technique, you learn to control certain bodily functions - such as muscle tension, heart rate and blood pressure - to prevent headaches or reduce headache pain.    ~~~~~~~~~~~~~~~~~~~~~~~~~~~~~~~~~~~~~~~~~~~~~~~~~~~~~~~~~~~~~~~~~  DR. Havana Baldwin'S GUIDE TO HAPPY AND HEALTHY LIVING These are some of my general health and wellness recommendations. Some of them may apply to you better than others. Please use common sense as you try these suggestions and feel free to ask me any questions.   ACTIVITY/FITNESS Mental,  social, emotional and physical stimulation are very important for brain and body health. Try learning a new activity (arts, music, language, sports, games).  Keep moving your body to the best of your abilities. You can do this at home, inside or outside, the park, community center, gym or anywhere you like. Consider a physical therapist or personal trainer to get started. Consider the app Sworkit. Fitness trackers such as smart-watches, smart-phones or Fitbits can help as well.   NUTRITION Eat more plants: colorful vegetables, nuts, seeds and berries.  Eat less sugar, salt, preservatives and processed foods.  Avoid toxins such as cigarettes and alcohol.  Drink water when you are thirsty. Warm water with a slice of lemon is an excellent morning drink to start the day.  Consider these websites for more information The Nutrition Source (hhttps://www.henry-hernandez.biz/ Precision Nutrition (wWindowBlog.ch   RELAXATION Consider practicing mindfulness meditation or other relaxation techniques such as deep breathing, prayer, yoga, tai chi, massage. See website mindful.org or the apps Headspace or Calm to help get started.   SLEEP Try to get at least 7-8+ hours sleep per day. Regular exercise and reduced caffeine will help you sleep better. Practice good sleep hygeine techniques. See website sleep.org for more information.   PLANNING Prepare estate planning, living will, healthcare POA documents. Sometimes this is best planned with the help of an attorney. Theconversationproject.org and agingwithdignity.org are excellent resources.

## 2017-01-11 ENCOUNTER — Ambulatory Visit (INDEPENDENT_AMBULATORY_CARE_PROVIDER_SITE_OTHER): Payer: Managed Care, Other (non HMO) | Admitting: Pulmonary Disease

## 2017-01-11 ENCOUNTER — Encounter: Payer: Self-pay | Admitting: Pulmonary Disease

## 2017-01-11 VITALS — BP 142/82 | HR 84

## 2017-01-11 DIAGNOSIS — Z8669 Personal history of other diseases of the nervous system and sense organs: Secondary | ICD-10-CM

## 2017-01-11 DIAGNOSIS — C9210 Chronic myeloid leukemia, BCR/ABL-positive, not having achieved remission: Secondary | ICD-10-CM | POA: Diagnosis not present

## 2017-01-11 DIAGNOSIS — R918 Other nonspecific abnormal finding of lung field: Secondary | ICD-10-CM

## 2017-01-11 NOTE — Progress Notes (Signed)
   Subjective:    Patient ID: Nathan Kerr, male    DOB: 1985-12-18, 31 y.o.   MRN: [id number]  HPI  31 year old male with chronic myelocytic Leukemia in remission for FU of   left hilar mass and pulmonary nodules.  Bronchoscopy 10/2016 Showedextrinsic compression of left lower lobe bronchus&involving bronchial wall Bronchial brushings &biopsies negative, LLL appeared open &narrowing could be traversed with forceps. Cultures all negative including AFB and fungal  He has been maintained on 20 mg of prednisone since 10/2016. His symptoms of wheezing, shortness of breath have improved but he continues to have severe bifrontal headaches-which did not seem to respond to Tylenol, NSAIDs or even oxycodone but does respond to prednisone  He had seen ENT 09/2016, head CT was negative , MRI is planned  He saw allergist- Kozlow, ANA was +1: 160, repeated  We reviewed his repeat CT chest and discussed options in detail  He was unfortunately let go at work and is currently unemployed    Significant tests/ events reviewed  CT chest 12/2016 left hilar mass decreased slightly 2.1 cm, right middle lobe 8 mm nodule is unchanged new nodule in right lower lobe and left lower lobe measuring 8 mm  10/2016 CT chest>>in the left lower lobe there is airway narrowing which appears to be due to extrinsic compression from a soft tissue density. There is some air-trapping in the left lower lobe.  ANA 1: 160 + ANCA neg    Past Medical History:  Diagnosis Date  . Allergy   . Asthma    Childhood, not currently active  . Chronic myeloid leukemia in remission ([place])   . CML (chronic myelocytic leukemia) ([place])   . Leukemia ([place]) 2010  . Migraine     Review of Systems neg for any significant sore throat, dysphagia, itching, sneezing, nasal congestion or excess/ purulent secretions, fever, chills, sweats, unintended wt loss, pleuritic or exertional cp, hempoptysis, orthopnea pnd or change in  chronic leg swelling. Also denies presyncope, palpitations, heartburn, abdominal pain, nausea, vomiting, diarrhea or change in bowel or urinary habits, dysuria,hematuria, rash, arthralgias, visual complaints, headache, numbness weakness or ataxia.     Objective:   Physical Exam  Gen. Pleasant, well-nourished, in no distress, normal affect ENT - no lesions, no post nasal drip Neck: No JVD, no thyromegaly, no carotid bruits Lungs: no use of accessory muscles, no dullness to percussion, clear without rales or rhonchi  Cardiovascular: Rhythm regular, heart sounds  normal, no murmurs or gallops, no peripheral edema Abdomen: soft and non-tender, no hepatosplenomegaly, BS normal. Musculoskeletal: No deformities, no cyanosis or clubbing Neuro:  alert, non focal       Assessment & Plan:

## 2017-01-11 NOTE — Assessment & Plan Note (Signed)
Although this is noted to be decreased on CT scan from 2.3-2.1 cm, this may simply be a margin of error in measurement. Appearance of new nodules in both lower lobes are concerning for metastatic disease. We'll proceed with PET scan, I had discussed this with his insurance company prior  He is frustrated and would like a diagnosis for her lung lesions. Options here includes repeat bronchoscopy with EBUS, needle biopsy of peripheral right lower lobe nodule by radiology or surgical resection of one of these nodules  I discussed risks and benefits of each of these procedures with him. He would like to proceed with whichever procedure we would recommend

## 2017-01-11 NOTE — Patient Instructions (Signed)
We discussed CT scan results - hilar mass slightly decreased from 2.3-2.1 cm but new nodule in right lower lobe 8 mm  Check CBC and BMET  Schedule PET scan-based on this will decide on appropriate biopsy

## 2017-01-11 NOTE — Assessment & Plan Note (Signed)
Headaches that respond to steroids brings up the differential diagnosis of temporal arteritis, MRI is planned

## 2017-01-11 NOTE — Assessment & Plan Note (Signed)
Persistent leukocytosis is concerning. We will repeat a CBC

## 2017-01-12 ENCOUNTER — Telehealth: Payer: Self-pay | Admitting: Diagnostic Neuroimaging

## 2017-01-12 NOTE — Telephone Encounter (Signed)
Patient called office in reference to having an ongoing headache for 3-4 days.  Patient would like to come in office to have a migraine cocktail today and would not prefer to take medication all weekend.  Please call.

## 2017-01-12 NOTE — Telephone Encounter (Signed)
Per tina in intrafusion.  IV worked well for him.Nathan Kerr

## 2017-01-12 NOTE — Telephone Encounter (Signed)
Spoke to pt and he would like to come in for migraine cocktail.  Spoke to Dr. Felecia Shelling.  Ok with Otila Kluver in intrafusion to come in for depacon 1000mg  IV and toradol 30mg  IV.  Pt to come on in.  [name] aware.

## 2017-01-14 ENCOUNTER — Other Ambulatory Visit: Payer: Self-pay | Admitting: Diagnostic Neuroimaging

## 2017-01-14 ENCOUNTER — Ambulatory Visit
Admission: RE | Admit: 2017-01-14 | Discharge: 2017-01-14 | Disposition: A | Discharge status: Home / Self Care | Payer: Managed Care, Other (non HMO) | Source: Ambulatory Visit | Attending: Diagnostic Neuroimaging | Admitting: Diagnostic Neuroimaging

## 2017-01-14 DIAGNOSIS — R519 Headache, unspecified: Secondary | ICD-10-CM

## 2017-01-14 DIAGNOSIS — R51 Headache: Principal | ICD-10-CM

## 2017-01-18 ENCOUNTER — Telehealth: Payer: Self-pay | Admitting: Diagnostic Neuroimaging

## 2017-01-18 MED ORDER — AMITRIPTYLINE HCL 25 MG PO TABS: 25.0000 mg | ORAL_TABLET | Freq: Every day | ORAL | 6 refills | Status: DC

## 2017-01-18 NOTE — Telephone Encounter (Signed)
I called pt and relayed that his MRI brain findings were unremarkable.  He is taking the topamax 50mg  po bid which has not done anything.  He states the maxalt, imitrex has not helped much.  He states that prednisone, depacon, vicodin have sometimes helped.  (these have helped the most out of all of them).  He was frustrated at what the cause of his migraines as they have started 5 months ago.  Recs?  What next?

## 2017-01-18 NOTE — Telephone Encounter (Signed)
I spoke with patient. Patient has been on and off prednisone for past 4-6 months initially for asthma, then for sinus problems, then for headache. He is taking 20 mg of prednisone every night at bedtime. He is getting frustrated with his headaches and lack of improvement. He is tolerating topiramate 50 mg twice a day but has not noted improvement.  I raise possibility of medication overuse headache related to chronic prednisone. I would like him to taper off of this at some point. However for now we will add amitriptyline at bedtime, continue topiramate 200 twice a day, and have patient continue on his prednisone 20 mg but change this to the early morning. Hopefully in the next 1 week his headaches will improve and at that time he may start a prednisone taper.  MR of the brain was unremarkable. ENT evaluation was unremarkable. 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   PLAN: 1. Start amitriptyline 25mg   2. Continue topiramate 50mg  BID  Meds ordered this encounter  Medications  . amitriptyline (ELAVIL) 25 MG tablet    Sig: Take 1 tablet (25 mg total) by mouth at bedtime.    Dispense:  30 tablet    Refill:  [place]ier, MD 3/81/7711, 6:57 PM Certified in Neurology, Neurophysiology and Neuroimaging  [facility] [address], [place], [place] [address] [phone]

## 2017-01-18 NOTE — Telephone Encounter (Signed)
Patient called regarding the results of his MRI he had over the weekend he would like to know if we have the results yet and would also like to know if he needs to have another MRI with contrast. Please call and advise. [phone].

## 2017-01-24 ENCOUNTER — Telehealth: Payer: Self-pay | Admitting: Pulmonary Disease

## 2017-01-24 DIAGNOSIS — R918 Other nonspecific abnormal finding of lung field: Secondary | ICD-10-CM

## 2017-01-24 NOTE — Telephone Encounter (Signed)
I have reviewed neurology recommendations - Ok to taper prednisone from my standpoint whenever they advise I have also d/w Dr Alen Blew Multidisciplinary team recommended repeat CT in 3 months but given our last discussion, will refer him to thoracic surgery - dr hendrickson for opinion on repeat biopsy -EBUS vs resection of nodule

## 2017-01-25 NOTE — Telephone Encounter (Signed)
Left message to call back  

## 2017-02-05 NOTE — Telephone Encounter (Signed)
Spoke with patient who stated the topiramate and amitriptyline are very helpful in controlling his headaches. He stated he does typically wake around 4 am with a headache, but it is much better than previously. He stated he wants to taper off prednisone and needs the taper schedule. Advised will discuss with D Penumalli and call him back this afternoon. He verbalized understanding, appreciation.

## 2017-02-05 NOTE — Telephone Encounter (Signed)
Pt states he was told that if he didn't hear back from Dr Leta Baptist by last week to call back re: the decreased dosage of predniSONE (DELTASONE) 10 MG tablet, please call back with recommended amount to step down to

## 2017-02-05 NOTE — Telephone Encounter (Signed)
Patient is returning phone call. [phone]

## 2017-02-05 NOTE — Telephone Encounter (Signed)
LVM advising patient that Dr Leta Baptist recommends he call his prescribing doctor to discuss prednisone taper.

## 2017-02-08 ENCOUNTER — Telehealth: Payer: Self-pay | Admitting: Pulmonary Disease

## 2017-02-08 NOTE — Telephone Encounter (Signed)
PCC's can you please help with the referral that was placed on 9/19 for pt to be seen by Dr. Roxan Hockey for the biopsy:?   Called and spoke with pt and he stated that he was seen by Neurology and they did give him something to help with the headaches.  He stated that he has been taking the prednisone 20 mg every day and he wanted to see about decreasing the dose.  RA please advise. Pt does not have a pending appt with RA.  Thanks

## 2017-02-08 NOTE — Telephone Encounter (Signed)
Spoke with pt, aware of pred recs.  Scheduled rov with TP.    PCC's please advise on referral to Dr. Roxan Hockey.  Thanks!

## 2017-02-08 NOTE — Telephone Encounter (Signed)
Sent a message to cindy [email or web address]  Joellen Jersey

## 2017-02-08 NOTE — Telephone Encounter (Signed)
Can you please get him an appointment ASAP with Dr. Roxan Hockey? Decreased to prednisone 15 mg x 2 weeks. Then 10 mg for 2 weeks and stay at that dose until seen  Please schedule office visit with TP/ Me in 4 weeks

## 2017-02-12 ENCOUNTER — Ambulatory Visit (INDEPENDENT_AMBULATORY_CARE_PROVIDER_SITE_OTHER): Payer: Managed Care, Other (non HMO) | Admitting: Family Medicine

## 2017-02-12 ENCOUNTER — Encounter: Payer: Self-pay | Admitting: Family Medicine

## 2017-02-12 VITALS — BP 142/104 | HR 121 | Temp 99.1°F | Ht 70.5 in | Wt 211.0 lb

## 2017-02-12 DIAGNOSIS — F4321 Adjustment disorder with depressed mood: Secondary | ICD-10-CM | POA: Diagnosis not present

## 2017-02-12 DIAGNOSIS — F322 Major depressive disorder, single episode, severe without psychotic features: Secondary | ICD-10-CM | POA: Insufficient documentation

## 2017-02-12 MED ORDER — SERTRALINE HCL 50 MG PO TABS
50.0000 mg | ORAL_TABLET | Freq: Every day | ORAL | 1 refills | Status: DC
Start: 2017-02-12 — End: 2017-08-13

## 2017-02-12 NOTE — Patient Instructions (Signed)
Thank you for coming in,   Please follow up with me in a week or two since we started this medication.   [facility] is a possible option for counseling.    Please feel free to call with any questions or concerns at any time, at [phone]. --Dr. Raeford Razor

## 2017-02-12 NOTE — Progress Notes (Signed)
Nathan Kerr - 31 y.o. male MRN [id number]  Date of birth: Jul 14, 1985  SUBJECTIVE:  Including CC & ROS.  Chief Complaint  Patient presents with  . Depression    Patient is here today needing help for depression. He lost his job then the bills started piling up.  His wife left last week.  Positive for SI without plan.  Had his friend take all firearms out of home.    [name] is a 31 year old male that is presenting with grief and depression. He reports he has lost his job and his wife left him over the course of this weekend. He's had ongoing headaches and being worked up for a mass in his chest. He denies ever taking medications for the symptoms. He feels like he initially had these feelings several months ago but thought he could manage without any form of treatment. Have been doing things recently that caused his wife to not trust him. She left and went to her parents house last week. He is been all alone in his house with nothing to do. He had thoughts of hurting himself and asked a friend to remove all of his guns from his house. He used to enjoy woodworking but this involves sharp tools so advised himself withhold from this activity. Has been reading more frequently to occupy his time. Has reached out to his preacher that married him and his wife to help with counseling.     Review of Systems  Skin: Negative for color change.  Neurological: Negative for weakness.  Psychiatric/Behavioral: Positive for decreased concentration and suicidal ideas.    HISTORY: Past Medical, Surgical, Social, and Family History Reviewed & Updated per EMR.   Pertinent Historical Findings include:  Past Medical History:  Diagnosis Date  . Allergy   . Asthma    Childhood, not currently active  . Chronic myeloid leukemia in remission ([place])   . CML (chronic myelocytic leukemia) ([place])   . Leukemia ([place]) 2010  . Migraine     Past Surgical History:  Procedure Laterality Date  . APPENDECTOMY    .  BRONCHOSCOPY    . VIDEO BRONCHOSCOPY Bilateral 10/23/2016   Procedure: VIDEO BRONCHOSCOPY WITHOUT FLUORO;  Surgeon: Rigoberto Noel, MD;  Location: [facility];  Service: Cardiopulmonary;  Laterality: Bilateral;    No Known Allergies  Family History  Problem Relation Age of Onset  . Sleep apnea Mother   . Migraines Father      Social History   Social History  . Marital status: Married    Spouse name: N/A  . Number of children: 0  . Years of education: 80   Occupational History  .      sales for [employer]   Social History Main Topics  . Smoking status: Never Smoker  . Smokeless tobacco: Never Used     Comment: occas cigar  . Alcohol use Yes     Comment: very occasional whiskey  . Drug use: No  . Sexual activity: Yes    Partners: Female   Other Topics Concern  . Not on file   Social History Narrative   Lives with wwife   Fun/Hobby: Video games, woodworking, gardening   Caffeine- coffee, 1-2 cups daily, occas soda and tea     PHYSICAL EXAM:  VS: BP (!) 142/104 (BP Location: Right Arm, Patient Position: Sitting, Cuff Size: Normal)   Pulse (!) 121   Temp 99.1 F (37.3 C) (Oral)   Ht 5' 10.5" (1.791 m)  Wt 211 lb (95.7 kg)   SpO2 95%   BMI 29.85 kg/m  Physical Exam Gen: NAD, alert, cooperative with exam,  ENT: normal lips, normal nasal mucosa,  Eye: normal EOM, normal conjunctiva and lids CV:  no edema, +2 pedal pulses   Resp: no accessory muscle use, non-labored,  Skin: no rashes, no areas of induration  Neuro: normal tone, normal sensation to touch Psych:  normal insight, alert and oriented MSK: Normal gait, normal strength      ASSESSMENT & PLAN:   I spent 25 minutes with this patient, greater than 50% was face-to-face time counseling regarding the below diagnosis.   Grief It appears that his most recent emotions with his wife leaving him more grief. Counseled on the fact that medications are not going to relieve the symptoms. These will  improve with time and counseling. Counseled that he should reach out to friends, his preacher, and his wife to discuss his feelings.  Current severe episode of major depressive disorder without psychotic features without prior episode [facility]) This is likely has been occurring for some time. Likely associated with his ongoing medical workup and loss of his job.  - Initiate Zoloft - He will follow up within a couple weeks - Not actively suicidal

## 2017-02-12 NOTE — Telephone Encounter (Signed)
Pt has appt to see dr Anson Fret on 02/19/17 Nathan Kerr

## 2017-02-12 NOTE — Assessment & Plan Note (Signed)
It appears that his most recent emotions with his wife leaving him more grief. Counseled on the fact that medications are not going to relieve the symptoms. These will improve with time and counseling. Counseled that he should reach out to friends, his preacher, and his wife to discuss his feelings.

## 2017-02-12 NOTE — Assessment & Plan Note (Signed)
This is likely has been occurring for some time. Likely associated with his ongoing medical workup and loss of his job.  - Initiate Zoloft - He will follow up within a couple weeks - Not actively suicidal

## 2017-02-15 ENCOUNTER — Encounter: Payer: Managed Care, Other (non HMO) | Admitting: Thoracic Surgery (Cardiothoracic Vascular Surgery)

## 2017-02-19 ENCOUNTER — Encounter: Payer: Self-pay | Admitting: Thoracic Surgery (Cardiothoracic Vascular Surgery)

## 2017-02-19 ENCOUNTER — Institutional Professional Consult (permissible substitution) (INDEPENDENT_AMBULATORY_CARE_PROVIDER_SITE_OTHER): Payer: Managed Care, Other (non HMO) | Admitting: Thoracic Surgery (Cardiothoracic Vascular Surgery)

## 2017-02-19 VITALS — BP 129/87 | HR 80 | Ht 70.0 in | Wt 200.0 lb

## 2017-02-19 DIAGNOSIS — R918 Other nonspecific abnormal finding of lung field: Secondary | ICD-10-CM

## 2017-02-19 NOTE — Progress Notes (Signed)
PCP is Golden Circle, FNP Referring Provider is Rigoberto Noel, MD  Chief Complaint  Patient presents with  . Mass    CT Chest 12/27/2016    HPI: Nathan Kerr is a 31 year old man sent for consultation regarding lung nodules a left hilar adenopathy.  Nathan Kerr is a 31 year old man with a history of chronic myeloid leukemia and childhood asthma. He is a lifelong nonsmoker. He also has a history of migraines. His CML is currently in remission. About 6 months ago he was having problems with headaches and nasal congestion. He then developed congestion and his chest. He had coughing and wheezing. One day in June he was very short of breath and went to an urgent care. A CT of the chest was done. It showed left lower lobe bronchial obstruction an air trapping.There was a circumferential soft tissue density. He saw pulmonary and had a bronchoscopy by Dr. Elsworth Soho. It showed extrinsic compression of the left lower lobe bronchus.sampling was nondiagnostic and cultures were negative. He was started on prednisone.  Rheumatologic workup was performed ANCA was negative and he had a weakly positive ANA.  His shortness of breath and wheezing improved but he continues to have bifrontal headaches. He's lost about 10-15 pounds over the past 3 months. He continues to have decreased energy and poor appetite. He also complains of blurry vision. An MR of the head showed chronic sinusitis changes.  Zubrod Score: At the time of surgery this patient's most appropriate activity status/level should be described as: []     0    Normal activity, no symptoms [x]     1    Restricted in physical strenuous activity but ambulatory, able to do out light work []     2    Ambulatory and capable of self care, unable to do work activities, up and about >50 % of waking hours                              []     3    Only limited self care, in bed greater than 50% of waking hours []     4    Completely disabled, no self care, confined to  bed or chair []     5    Moribund  Past Medical History:  Diagnosis Date  . Allergy   . Asthma    Childhood, not currently active  . Chronic myeloid leukemia in remission ([place])   . CML (chronic myelocytic leukemia) ([place])   . Leukemia ([place]) 2010  . Migraine     Past Surgical History:  Procedure Laterality Date  . APPENDECTOMY    . BRONCHOSCOPY    . VIDEO BRONCHOSCOPY Bilateral 10/23/2016   Procedure: VIDEO BRONCHOSCOPY WITHOUT FLUORO;  Surgeon: Rigoberto Noel, MD;  Location: [facility];  Service: Cardiopulmonary;  Laterality: Bilateral;    Family History  Problem Relation Age of Onset  . Sleep apnea Mother   . Migraines Father     Social History Social History  Substance Use Topics  . Smoking status: Never Smoker  . Smokeless tobacco: Never Used     Comment: occas cigar  . Alcohol use Yes     Comment: very occasional whiskey    Current Outpatient Prescriptions  Medication Sig Dispense Refill  . acetaminophen (TYLENOL) 500 MG tablet Take 1,000 mg by mouth every 6 (six) hours as needed for mild pain.    [name] acetaminophen-codeine (TYLENOL #3) 300-30 MG  tablet Take 1 tablet by mouth every 4 (four) hours as needed for moderate pain. 30 tablet 0  . cetirizine (ZYRTEC) 10 MG tablet Take 10 mg by mouth daily.    . clindamycin (CLEOCIN T) 1 % lotion Apply topically 2 (two) times daily. While on steroids. 60 mL 0  . fluticasone furoate-vilanterol (BREO ELLIPTA) 100-25 MCG/INH AEPB Inhale 1 puff into the lungs daily. 1 each 3  . ibuprofen (ADVIL,MOTRIN) 200 MG tablet Take 400-600 mg by mouth every 6 (six) hours as needed.    . meloxicam (MOBIC) 15 MG tablet Take 1 tablet (15 mg total) by mouth daily. PATIENT NEEDS OFFICE VISIT FOR ADDITIONAL REFILLS 30 tablet 3  . nilotinib (TASIGNA) 150 MG capsule Take 2 capsules (300 mg total) by mouth every 12 (twelve) hours. 360 capsule 0  . oxycodone-acetaminophen (PERCOCET) 2.5-325 MG tablet Take 1 tablet by mouth every 4 (four) hours as  needed for pain.    . predniSONE (DELTASONE) 10 MG tablet Take 2 tablets (20 mg total) by mouth daily with breakfast. 60 tablet 2  . PROAIR HFA 108 (90 Base) MCG/ACT inhaler Inhale 2 puffs into the lungs every 4 (four) hours as needed for wheezing. 1 Inhaler 1  . rizatriptan (MAXALT-MLT) 10 MG disintegrating tablet Take 1 tablet (10 mg total) by mouth as needed for migraine. May repeat in 2 hours if needed 9 tablet 11  . sertraline (ZOLOFT) 50 MG tablet Take 1 tablet (50 mg total) by mouth daily. 30 tablet 1  . SUMAtriptan (IMITREX) 100 MG tablet Take 1 tablet at onset of headache. May repeat in 2 hrs if needed, MAX 2 tabs/24 hrs. 9 tablet 5  . topiramate (TOPAMAX) 50 MG tablet Take 1 tablet (50 mg total) by mouth 2 (two) times daily. 60 tablet 12   Current Facility-Administered Medications  Medication Dose Route Frequency Provider Last Rate Last Dose  . ondansetron (ZOFRAN-ODT) disintegrating tablet 8 mg  8 mg Oral Once Leandrew Koyanagi, MD        No Known Allergies  Review of Systems  Constitutional: Positive for activity change, appetite change and unexpected weight change (lost 10 to 15 pounds in 3 months). Negative for chills and fever.  HENT: Negative for trouble swallowing and voice change.   Eyes: Positive for visual disturbance.  Respiratory: Positive for cough (resolved), shortness of breath (resolved) and wheezing (resolved).   Gastrointestinal: Negative for abdominal pain and blood in stool.  Genitourinary: Negative.   Musculoskeletal: Negative.  Negative for arthralgias and myalgias.  Neurological: Positive for headaches. Negative for seizures and syncope.  Hematological: Negative for adenopathy. Does not bruise/bleed easily.  Psychiatric/Behavioral: Positive for dysphoric mood.  All other systems reviewed and are negative.   BP 129/87   Pulse 80   Ht 5\' 10"  (1.778 m)   Wt 200 lb (90.7 kg)   SpO2 98%   BMI 28.70 kg/m  Physical Exam  Constitutional: He is oriented  to person, place, and time. He appears well-developed and well-nourished. No distress.  HENT:  Head: Normocephalic and atraumatic.  Mouth/Throat: No oropharyngeal exudate.  Eyes: Conjunctivae and EOM are normal. No scleral icterus.  Neck: No thyromegaly present.  Cardiovascular: Normal rate and regular rhythm.   Murmur (faint systolic) heard. Pulmonary/Chest: Effort normal and breath sounds normal. No respiratory distress. He has no wheezes. He has no rales.  Abdominal: Soft. He exhibits no distension. There is no tenderness.  Musculoskeletal: He exhibits no edema or deformity.  Lymphadenopathy:  He has no cervical adenopathy.  Neurological: He is alert and oriented to person, place, and time. No cranial nerve deficit. He exhibits normal muscle tone.  Skin: Skin is warm and dry.  Vitals reviewed.    Diagnostic Tests: CT CHEST WITH CONTRAST  TECHNIQUE: Multidetector CT imaging of the chest was performed during intravenous contrast administration.  CONTRAST:  75 cc of Isovue-300  COMPARISON:  10/21/2016  FINDINGS: Cardiovascular: The heart size appears within normal limits. There is no pericardial effusion.  Mediastinum/Nodes: The trachea appears patent and is midline. Normal appearance of the esophagus. 2.1 cm left hilar mass is identified which encases/narrows the left lower lobe bronchus. Previously this measured 2.3 cm.  Lungs/Pleura: Pulmonary nodule within the subpleural right lower lobe measures 8 mm, image 97 of series 7. New from previous exam. Subpleural nodule within the right middle lobe measures 8 mm and is unchanged compared with previous exam, image 89 of series 7. New nodule within the left lower lobe measures 8 mm, image 108 of series 7.  Upper Abdomen: 7 mm low-density structure in the posterior right lobe of liver is unchanged from previous exam. This remains too small to reliably characterize.  Musculoskeletal: No aggressive lytic or  sclerotic bone lesions.  IMPRESSION: 1. Left perihilar mass narrowing the left lower lobe bronchus is slightly decreased in size in the interval. There is continued narrowing of the left lower lobe bronchus. 2. Bilateral pulmonary nodules are identified and progressed from previous exam.   Electronically Signed   By: Kerby Moors M.D.   On: 12/27/2016 13:12 I personally reviewed the CT images also reviewed them with Nathan Kerr. I concur with the findings noted above  Impression: Nathan Kerr is a 31 year old gentleman with a history of chronic myeloid leukemia who originally presented with cough, wheezing, and shortness of breath. A CT of the chest showed soft tissue density with extrinsic compression of the left lower lobe bronchus. There was definite air trapping in the left lower lobe. Bronchoscopy was nondiagnostic. Cultures were negative. He has been on prednisone and has cough, wheezing, and shortness of breath have improved. However his overall sense of well-being has not. He continues to have headaches, blurry vision, loss of appetite, and decreased energy. A repeat CT in August showed decrease in size of the soft tissue density around the lower lobe bronchus. I think the decrease is actually more than indicated by the overall change in measurement as the airway occupies much larger portion of this space that had previously. On the other hand he had new pulmonary nodules.  This is a very unusual case with multiple possible etiologies. Given his history of leukemia, the immediate concern is the possibility of lymphoma. The decrease in size of the left hilar abnormality would be unusual, but not impossible. Atypical infections could potentially cause this appearance. Finally sarcoidosis is a possibility although you wouldn't expect a differential decrease and hilar adenopathy at the same time that new lung nodules were being noted.  I discussed multiple potential options with Mr.  Kerr. 1. Continued radiographic observation. It's been about 6 weeks and is since his last scan so he would be due for another scan a couple of weeks.  2. Repeat bronchoscopy possibly with endobronchial ultrasound. I don't favor this is a think it is less likely be diagnostic than it was the first time.  3. CT-guided biopsy of one of the lung nodules. There very small and would likely be difficult to biopsy. A negative result would  not rule out any possibility. Attending that would be dependent on IR.  4. VATS for lung nodule and lymph node biopsy. Most likely to be definitive although there is no guarantee. Does involve a surgical procedure and a hospital stay. His case my advice would be to attempt a left VATS to do a wedge resection of a lung nodule and lymph node biopsy. I described the general nature of that procedure to him. He understands the need for general anesthesia, theincisions to be used, the use of a drainage tube postoperatively, the expected hospital stay, and the overall recovery. I informed him of the indications, risks, benefits, and alternatives. He understands the risk include, but are not limited to death (very small risk), DVT, PE, bleeding, possible need for transfusion, infection, prolonged air leak, as well as the possibility of other unforeseeable complications. He understands the degree of pain is variable.  He understandably needs some time to think about his options and discuss with his family before making a decision.  Plan: He will call and let us know how he would like to proceed. If he would like to come back for further discussion, I will be happy to meet with him and his family.  Melrose Nakayama, MD [facility] [phone]

## 2017-02-26 ENCOUNTER — Other Ambulatory Visit: Payer: Self-pay

## 2017-02-26 DIAGNOSIS — R590 Localized enlarged lymph nodes: Secondary | ICD-10-CM

## 2017-02-26 DIAGNOSIS — R918 Other nonspecific abnormal finding of lung field: Secondary | ICD-10-CM

## 2017-02-26 NOTE — Progress Notes (Signed)
Surgical schedule mailed out.and on my chart

## 2017-02-27 NOTE — Pre-Procedure Instructions (Signed)
Nathan Kerr  02/27/2017      [facility] - [address] [place] [address] [place] [address] Phone: [phone] Fax: [phone]    Your procedure is scheduled on 03/02/2017.  Report to [facility] at [place].M.  Call this number if you have problems the morning of surgery:  [phone]   Remember:  Do not eat food or drink liquids after midnight.  Take these medicines the morning of surgery with A SIP OF WATER: Acetaminophen (Tylenol) - if needed Oxycodone-acetaminophen (Percocet) - if needed Prednisone (Deltasone) Sertraline (Zoloft) Topiramate (Topamax)  7 days prior to surgery STOP taking any Aspirin (unless otherwise instructed by your surgeon), Aleve, Naproxen, Ibuprofen, Motrin, Advil, Goody's, BC's, all herbal medications, fish oil, and all vitamins, and meloxicam (Mobic)   Do not wear jewelry  Do not wear lotions, powders, or colognes, or deodorant.  Men may shave face and neck.  Do not bring valuables to the hospital.  [facility] is not responsible for any belongings or valuables.  Contacts, eyeglasses, dentures or bridgework may not be worn into surgery.  Leave your suitcase in the car.  After surgery it may be brought to your room.  For patients admitted to the hospital, discharge time will be determined by your treatment team.  Patients discharged the day of surgery will not be allowed to drive home.   Name and phone number of your driver:    Special instructions:   [facility]- Preparing For Surgery  Before surgery, you can play an important role. Because skin is not sterile, your skin needs to be as free of germs as possible. You can reduce the number of germs on your skin by washing with CHG (chlorahexidine gluconate) Soap before surgery.  CHG is an antiseptic cleaner which kills germs and bonds with the skin to continue killing germs even after  washing.  Please do not use if you have an allergy to CHG or antibacterial soaps. If your skin becomes reddened/irritated stop using the CHG.  Do not shave (including legs and underarms) for at least 48 hours prior to first CHG shower. It is OK to shave your face.  Please follow these instructions carefully.   1. Shower the NIGHT BEFORE SURGERY and the MORNING OF SURGERY with CHG.   2. If you chose to wash your hair, wash your hair first as usual with your normal shampoo.  3. After you shampoo, rinse your hair and body thoroughly to remove the shampoo.  4. Use CHG as you would any other liquid soap. You can apply CHG directly to the skin and wash gently with a scrungie or a clean washcloth.   5. Apply the CHG Soap to your body ONLY FROM THE NECK DOWN.  Do not use on open wounds or open sores. Avoid contact with your eyes, ears, mouth and genitals (private parts). Wash Face and genitals (private parts)  with your normal soap.  6. Wash thoroughly, paying special attention to the area where your surgery will be performed.  7. Thoroughly rinse your body with warm water from the neck down.  8. DO NOT shower/wash with your normal soap after using and rinsing off the CHG Soap.  9. Pat yourself dry with a CLEAN TOWEL.  10. Wear CLEAN PAJAMAS to bed the night before surgery, wear comfortable clothes the morning of surgery  11. Place CLEAN SHEETS on your  bed the night of your first shower and DO NOT SLEEP WITH PETS.    Day of Surgery: Do not apply any deodorants/lotions. Please wear clean clothes to the hospital/surgery center.      Please read over the following fact sheets that you were given. Pain Booklet, MRSA Information and Surgical Site Infection Prevention, and Incentive Spirometry

## 2017-02-28 ENCOUNTER — Encounter (HOSPITAL_COMMUNITY)
Admission: RE | Admit: 2017-02-28 | Discharge: 2017-02-28 | Disposition: A | Discharge status: Home / Self Care | Payer: Managed Care, Other (non HMO) | Source: Ambulatory Visit | Attending: Thoracic Surgery (Cardiothoracic Vascular Surgery) | Admitting: Thoracic Surgery (Cardiothoracic Vascular Surgery)

## 2017-02-28 ENCOUNTER — Encounter (HOSPITAL_COMMUNITY): Payer: Self-pay

## 2017-02-28 ENCOUNTER — Ambulatory Visit (HOSPITAL_COMMUNITY)
Admission: RE | Admit: 2017-02-28 | Discharge: 2017-02-28 | Disposition: A | Discharge status: Home / Self Care | Payer: Managed Care, Other (non HMO) | Source: Ambulatory Visit | Attending: Thoracic Surgery (Cardiothoracic Vascular Surgery) | Admitting: Thoracic Surgery (Cardiothoracic Vascular Surgery)

## 2017-02-28 DIAGNOSIS — J329 Chronic sinusitis, unspecified: Secondary | ICD-10-CM | POA: Diagnosis present

## 2017-02-28 DIAGNOSIS — H538 Other visual disturbances: Secondary | ICD-10-CM | POA: Diagnosis present

## 2017-02-28 DIAGNOSIS — R918 Other nonspecific abnormal finding of lung field: Secondary | ICD-10-CM

## 2017-02-28 DIAGNOSIS — J9811 Atelectasis: Secondary | ICD-10-CM | POA: Diagnosis not present

## 2017-02-28 DIAGNOSIS — J850 Gangrene and necrosis of lung: Principal | ICD-10-CM | POA: Diagnosis present

## 2017-02-28 DIAGNOSIS — Z01818 Encounter for other preprocedural examination: Secondary | ICD-10-CM | POA: Insufficient documentation

## 2017-02-28 DIAGNOSIS — R590 Localized enlarged lymph nodes: Secondary | ICD-10-CM

## 2017-02-28 DIAGNOSIS — R9431 Abnormal electrocardiogram [ECG] [EKG]: Secondary | ICD-10-CM

## 2017-02-28 DIAGNOSIS — G43909 Migraine, unspecified, not intractable, without status migrainosus: Secondary | ICD-10-CM | POA: Diagnosis present

## 2017-02-28 DIAGNOSIS — J9809 Other diseases of bronchus, not elsewhere classified: Secondary | ICD-10-CM | POA: Diagnosis present

## 2017-02-28 DIAGNOSIS — Z79891 Long term (current) use of opiate analgesic: Secondary | ICD-10-CM

## 2017-02-28 DIAGNOSIS — R630 Anorexia: Secondary | ICD-10-CM | POA: Diagnosis present

## 2017-02-28 DIAGNOSIS — Z791 Long term (current) use of non-steroidal anti-inflammatories (NSAID): Secondary | ICD-10-CM

## 2017-02-28 DIAGNOSIS — C9211 Chronic myeloid leukemia, BCR/ABL-positive, in remission: Secondary | ICD-10-CM | POA: Diagnosis present

## 2017-02-28 DIAGNOSIS — Z7952 Long term (current) use of systemic steroids: Secondary | ICD-10-CM

## 2017-02-28 DIAGNOSIS — J45909 Unspecified asthma, uncomplicated: Secondary | ICD-10-CM | POA: Diagnosis present

## 2017-02-28 DIAGNOSIS — J449 Chronic obstructive pulmonary disease, unspecified: Secondary | ICD-10-CM | POA: Insufficient documentation

## 2017-02-28 DIAGNOSIS — D696 Thrombocytopenia, unspecified: Secondary | ICD-10-CM | POA: Diagnosis present

## 2017-02-28 DIAGNOSIS — Z79899 Other long term (current) drug therapy: Secondary | ICD-10-CM

## 2017-02-28 DIAGNOSIS — D62 Acute posthemorrhagic anemia: Secondary | ICD-10-CM | POA: Diagnosis not present

## 2017-02-28 HISTORY — DX: Major depressive disorder, single episode, unspecified: F32.9

## 2017-02-28 HISTORY — DX: Depression, unspecified: F32.A

## 2017-02-28 LAB — COMPREHENSIVE METABOLIC PANEL
ALK PHOS: 71 U/L (ref 38–126)
ALT: 43 U/L (ref 17–63)
ANION GAP: 11 (ref 5–15)
AST: 16 U/L (ref 15–41)
Albumin: 4.3 g/dL (ref 3.5–5.0)
BILIRUBIN TOTAL: 1.1 mg/dL (ref 0.3–1.2)
BUN: 9 mg/dL (ref 6–20)
CALCIUM: 9.7 mg/dL (ref 8.9–10.3)
CO2: 19 mmol/L — ABNORMAL LOW (ref 22–32)
Chloride: 109 mmol/L (ref 101–111)
Creatinine, Ser: 1.24 mg/dL (ref 0.61–1.24)
GFR calc non Af Amer: >60 mL/min (ref >60)
Glucose, Bld: 105 mg/dL — ABNORMAL HIGH (ref 65–99)
Potassium: 3.3 mmol/L — ABNORMAL LOW (ref 3.5–5.1)
Sodium: 139 mmol/L (ref 135–145)
Total Protein: 7.5 g/dL (ref 6.5–8.1)

## 2017-02-28 LAB — CBC
HEMATOCRIT: 40.8 % (ref 39.0–52.0)
HEMOGLOBIN: 13.6 g/dL (ref 13.0–17.0)
MCH: 26.2 pg (ref 26.0–34.0)
MCHC: 33.3 g/dL (ref 30.0–36.0)
MCV: 78.5 fL (ref 78.0–100.0)
Platelets: 116 10*3/uL — ABNORMAL LOW (ref 150–400)
RBC: 5.2 MIL/uL (ref 4.22–5.81)
RDW: 17.2 % — ABNORMAL HIGH (ref 11.5–15.5)
WBC: 21.1 10*3/uL — ABNORMAL HIGH (ref 4.0–10.5)

## 2017-02-28 LAB — PULMONARY FUNCTION TEST
DL/VA % PRED: 90 %
DL/VA: 4.22 ml/min/mmHg/L
DLCO unc % pred: 82 %
DLCO unc: 26.76 ml/min/mmHg
FEF 25-75 POST: 2.8 L/s
FEF 25-75 Pre: 2.38 L/sec
FEF2575-%CHANGE-POST: 17 %
FEF2575-%PRED-PRE: 54 %
FEF2575-%Pred-Post: 64 %
FEV1-%CHANGE-POST: 6 %
FEV1-%Pred-Post: 83 %
FEV1-%Pred-Pre: 78 %
FEV1-PRE: 3.45 L
FEV1-Post: 3.68 L
FEV1FVC-%CHANGE-POST: 4 %
FEV1FVC-%Pred-Pre: 84 %
FEV6-%Change-Post: 1 %
FEV6-%PRED-PRE: 94 %
FEV6-%Pred-Post: 95 %
FEV6-Post: 5.13 L
FEV6-Pre: 5.04 L
FEV6FVC-%Change-Post: 0 %
FEV6FVC-%Pred-Post: 101 %
FEV6FVC-%Pred-Pre: 102 %
FVC-%CHANGE-POST: 2 %
FVC-%PRED-PRE: 92 %
FVC-%Pred-Post: 94 %
FVC-POST: 5.15 L
FVC-PRE: 5.04 L
POST FEV6/FVC RATIO: 100 %
PRE FEV1/FVC RATIO: 68 %
Post FEV1/FVC ratio: 71 %
Pre FEV6/FVC Ratio: 100 %
RV % pred: 130 %
RV: 2.17 L
TLC % pred: 104 %
TLC: 7.23 L

## 2017-02-28 LAB — BLOOD GAS, ARTERIAL
Acid-base deficit: 4.7 mmol/L — ABNORMAL HIGH (ref 0.0–2.0)
Bicarbonate: 18.3 mmol/L — ABNORMAL LOW (ref 20.0–28.0)
DRAWN BY: 470591
FIO2: 21
O2 Saturation: 94.9 %
PH ART: 7.472 — AB (ref 7.350–7.450)
Patient temperature: 98.6
pCO2 arterial: 25.4 mmHg — ABNORMAL LOW (ref 32.0–48.0)
pO2, Arterial: 71.4 mmHg — ABNORMAL LOW (ref 83.0–108.0)

## 2017-02-28 LAB — TYPE AND SCREEN
ABO/RH(D): A POS
Antibody Screen: NEGATIVE

## 2017-02-28 LAB — ABO/RH: ABO/RH(D): A POS

## 2017-02-28 LAB — PROTIME-INR
INR: 1.1
Prothrombin Time: 14.1 seconds (ref 11.4–15.2)

## 2017-02-28 LAB — APTT: aPTT: 28 seconds (ref 24–36)

## 2017-02-28 LAB — SURGICAL PCR SCREEN
MRSA, PCR: NEGATIVE
STAPHYLOCOCCUS AUREUS: NEGATIVE

## 2017-02-28 MED ORDER — ALBUTEROL SULFATE (2.5 MG/3ML) 0.083% IN NEBU
2.5000 mg | INHALATION_SOLUTION | Freq: Once | RESPIRATORY_TRACT | Status: AC
  Administered 2017-02-28: 2.5 mg via RESPIRATORY_TRACT

## 2017-03-02 ENCOUNTER — Inpatient Hospital Stay (HOSPITAL_COMMUNITY): Payer: Managed Care, Other (non HMO) | Admitting: Emergency Medicine

## 2017-03-02 ENCOUNTER — Encounter (HOSPITAL_COMMUNITY)
Admission: RE | Disposition: A | Discharge status: Home / Self Care | Payer: Self-pay | Source: Ambulatory Visit | Attending: Thoracic Surgery (Cardiothoracic Vascular Surgery)

## 2017-03-02 ENCOUNTER — Inpatient Hospital Stay (HOSPITAL_COMMUNITY): Payer: Managed Care, Other (non HMO)

## 2017-03-02 ENCOUNTER — Inpatient Hospital Stay (HOSPITAL_COMMUNITY)
Admission: RE | Admit: 2017-03-02 | Discharge: 2017-03-05 | DRG: 167 | Disposition: A | Discharge status: Home / Self Care | Payer: Managed Care, Other (non HMO) | Source: Ambulatory Visit | Attending: Thoracic Surgery (Cardiothoracic Vascular Surgery) | Admitting: Thoracic Surgery (Cardiothoracic Vascular Surgery)

## 2017-03-02 ENCOUNTER — Encounter (HOSPITAL_COMMUNITY): Payer: Self-pay | Admitting: Anesthesiology

## 2017-03-02 DIAGNOSIS — R599 Enlarged lymph nodes, unspecified: Secondary | ICD-10-CM | POA: Diagnosis not present

## 2017-03-02 DIAGNOSIS — R590 Localized enlarged lymph nodes: Secondary | ICD-10-CM

## 2017-03-02 DIAGNOSIS — J45909 Unspecified asthma, uncomplicated: Secondary | ICD-10-CM | POA: Diagnosis present

## 2017-03-02 DIAGNOSIS — R918 Other nonspecific abnormal finding of lung field: Secondary | ICD-10-CM | POA: Diagnosis present

## 2017-03-02 DIAGNOSIS — D696 Thrombocytopenia, unspecified: Secondary | ICD-10-CM | POA: Diagnosis present

## 2017-03-02 DIAGNOSIS — Z09 Encounter for follow-up examination after completed treatment for conditions other than malignant neoplasm: Secondary | ICD-10-CM

## 2017-03-02 DIAGNOSIS — D62 Acute posthemorrhagic anemia: Secondary | ICD-10-CM | POA: Diagnosis not present

## 2017-03-02 DIAGNOSIS — J329 Chronic sinusitis, unspecified: Secondary | ICD-10-CM | POA: Diagnosis present

## 2017-03-02 DIAGNOSIS — Z79899 Other long term (current) drug therapy: Secondary | ICD-10-CM | POA: Diagnosis not present

## 2017-03-02 DIAGNOSIS — Z791 Long term (current) use of non-steroidal anti-inflammatories (NSAID): Secondary | ICD-10-CM | POA: Diagnosis not present

## 2017-03-02 DIAGNOSIS — G43909 Migraine, unspecified, not intractable, without status migrainosus: Secondary | ICD-10-CM | POA: Diagnosis present

## 2017-03-02 DIAGNOSIS — C9211 Chronic myeloid leukemia, BCR/ABL-positive, in remission: Secondary | ICD-10-CM | POA: Diagnosis present

## 2017-03-02 DIAGNOSIS — J9809 Other diseases of bronchus, not elsewhere classified: Secondary | ICD-10-CM | POA: Diagnosis present

## 2017-03-02 DIAGNOSIS — Z79891 Long term (current) use of opiate analgesic: Secondary | ICD-10-CM | POA: Diagnosis not present

## 2017-03-02 DIAGNOSIS — R630 Anorexia: Secondary | ICD-10-CM | POA: Diagnosis present

## 2017-03-02 DIAGNOSIS — H538 Other visual disturbances: Secondary | ICD-10-CM | POA: Diagnosis present

## 2017-03-02 DIAGNOSIS — J850 Gangrene and necrosis of lung: Secondary | ICD-10-CM | POA: Diagnosis present

## 2017-03-02 DIAGNOSIS — R911 Solitary pulmonary nodule: Secondary | ICD-10-CM

## 2017-03-02 DIAGNOSIS — J9811 Atelectasis: Secondary | ICD-10-CM | POA: Diagnosis not present

## 2017-03-02 DIAGNOSIS — Z7952 Long term (current) use of systemic steroids: Secondary | ICD-10-CM | POA: Diagnosis not present

## 2017-03-02 HISTORY — PX: VIDEO ASSISTED THORACOSCOPY (VATS)/WEDGE RESECTION: SHX6174

## 2017-03-02 HISTORY — PX: LYMPH NODE BIOPSY: SHX201

## 2017-03-02 SURGERY — VIDEO ASSISTED THORACOSCOPY (VATS)/WEDGE RESECTION
Anesthesia: General | Site: Chest | Laterality: Left

## 2017-03-02 MED ORDER — PANTOPRAZOLE SODIUM 40 MG PO TBEC
40.0000 mg | DELAYED_RELEASE_TABLET | Freq: Every day | ORAL | Status: DC
  Administered 2017-03-03 – 2017-03-04 (×2): 40 mg via ORAL
  Filled 2017-03-02 (×3): qty 1

## 2017-03-02 MED ORDER — DEXTROSE 5 % IV SOLN
1.5000 g | INTRAVENOUS | Status: AC
  Administered 2017-03-02: 1.5 g via INTRAVENOUS
  Filled 2017-03-02: qty 1.5

## 2017-03-02 MED ORDER — HYDROMORPHONE HCL 1 MG/ML IJ SOLN
INTRAMUSCULAR | Status: AC
  Filled 2017-03-02: qty 1

## 2017-03-02 MED ORDER — SERTRALINE HCL 50 MG PO TABS
50.0000 mg | ORAL_TABLET | Freq: Every day | ORAL | Status: DC
  Administered 2017-03-03 – 2017-03-05 (×3): 50 mg via ORAL
  Filled 2017-03-02 (×3): qty 1

## 2017-03-02 MED ORDER — DEXTROSE-NACL 5-0.9 % IV SOLN
INTRAVENOUS | Status: DC
  Administered 2017-03-02 – 2017-03-03 (×2): via INTRAVENOUS

## 2017-03-02 MED ORDER — ACETAMINOPHEN 160 MG/5ML PO SOLN: 1000.0000 mg | Freq: Four times a day (QID) | ORAL | Status: DC

## 2017-03-02 MED ORDER — NALOXONE HCL 0.4 MG/ML IJ SOLN: 0.4000 mg | INTRAMUSCULAR | Status: DC | PRN

## 2017-03-02 MED ORDER — LACTATED RINGERS IV SOLN
INTRAVENOUS | Status: DC | PRN
  Administered 2017-03-02 (×2): via INTRAVENOUS

## 2017-03-02 MED ORDER — LUNG SURGERY BOOK
Freq: Once | Status: AC
  Administered 2017-03-02: 1
  Filled 2017-03-02: qty 1

## 2017-03-02 MED ORDER — ALBUTEROL SULFATE (2.5 MG/3ML) 0.083% IN NEBU: 2.5000 mg | INHALATION_SOLUTION | RESPIRATORY_TRACT | Status: DC

## 2017-03-02 MED ORDER — TOPIRAMATE 25 MG PO TABS
50.0000 mg | ORAL_TABLET | Freq: Two times a day (BID) | ORAL | Status: DC
  Administered 2017-03-03 – 2017-03-05 (×5): 50 mg via ORAL
  Filled 2017-03-02 (×5): qty 2

## 2017-03-02 MED ORDER — DEXTROSE 5 % IV SOLN
1.5000 g | Freq: Two times a day (BID) | INTRAVENOUS | Status: AC
  Administered 2017-03-02 – 2017-03-03 (×2): 1.5 g via INTRAVENOUS
  Filled 2017-03-02 (×2): qty 1.5

## 2017-03-02 MED ORDER — ENOXAPARIN SODIUM 40 MG/0.4ML ~~LOC~~ SOLN
40.0000 mg | SUBCUTANEOUS | Status: DC
  Administered 2017-03-03: 40 mg via SUBCUTANEOUS
  Filled 2017-03-02: qty 0.4

## 2017-03-02 MED ORDER — NILOTINIB HCL 150 MG PO CAPS
300.0000 mg | ORAL_CAPSULE | Freq: Two times a day (BID) | ORAL | Status: DC
  Administered 2017-03-03 – 2017-03-05 (×5): 300 mg via ORAL
  Filled 2017-03-02 (×5): qty 2

## 2017-03-02 MED ORDER — LIDOCAINE HCL (CARDIAC) 20 MG/ML IV SOLN
INTRAVENOUS | Status: DC | PRN
  Administered 2017-03-02: 100 mg via INTRATRACHEAL

## 2017-03-02 MED ORDER — EPHEDRINE SULFATE-NACL 50-0.9 MG/10ML-% IV SOSY
PREFILLED_SYRINGE | INTRAVENOUS | Status: DC | PRN
  Administered 2017-03-02: 10 mg via INTRAVENOUS

## 2017-03-02 MED ORDER — HYDROMORPHONE HCL 1 MG/ML IJ SOLN
0.2500 mg | INTRAMUSCULAR | Status: DC | PRN
  Administered 2017-03-02 (×4): 0.5 mg via INTRAVENOUS

## 2017-03-02 MED ORDER — PROPOFOL 10 MG/ML IV BOLUS
INTRAVENOUS | Status: AC
  Filled 2017-03-02: qty 20

## 2017-03-02 MED ORDER — BISACODYL 5 MG PO TBEC
10.0000 mg | DELAYED_RELEASE_TABLET | Freq: Every day | ORAL | Status: DC
  Administered 2017-03-03 – 2017-03-04 (×2): 10 mg via ORAL
  Filled 2017-03-02 (×3): qty 2

## 2017-03-02 MED ORDER — ALBUTEROL SULFATE (2.5 MG/3ML) 0.083% IN NEBU: 3.0000 mL | INHALATION_SOLUTION | RESPIRATORY_TRACT | Status: DC | PRN

## 2017-03-02 MED ORDER — PREDNISONE 10 MG PO TABS
10.0000 mg | ORAL_TABLET | Freq: Every day | ORAL | Status: DC
  Administered 2017-03-03 – 2017-03-05 (×3): 10 mg via ORAL
  Filled 2017-03-02 (×3): qty 1

## 2017-03-02 MED ORDER — ALBUTEROL SULFATE (2.5 MG/3ML) 0.083% IN NEBU
2.5000 mg | INHALATION_SOLUTION | RESPIRATORY_TRACT | Status: DC
  Administered 2017-03-02 – 2017-03-03 (×2): 2.5 mg via RESPIRATORY_TRACT
  Filled 2017-03-02 (×2): qty 3

## 2017-03-02 MED ORDER — FENTANYL CITRATE (PF) 100 MCG/2ML IJ SOLN
INTRAMUSCULAR | Status: AC
  Filled 2017-03-02: qty 2

## 2017-03-02 MED ORDER — GLYCOPYRROLATE 0.2 MG/ML IJ SOLN
INTRAMUSCULAR | Status: DC | PRN
  Administered 2017-03-02: 0.6 mg via INTRAVENOUS

## 2017-03-02 MED ORDER — TRAMADOL HCL 50 MG PO TABS
50.0000 mg | ORAL_TABLET | Freq: Four times a day (QID) | ORAL | Status: DC | PRN
  Administered 2017-03-03: 50 mg via ORAL
  Filled 2017-03-02: qty 1

## 2017-03-02 MED ORDER — ONDANSETRON HCL 4 MG/2ML IJ SOLN
INTRAMUSCULAR | Status: DC | PRN
  Administered 2017-03-02: 4 mg via INTRAVENOUS

## 2017-03-02 MED ORDER — MIDAZOLAM HCL 2 MG/2ML IJ SOLN
INTRAMUSCULAR | Status: AC
  Filled 2017-03-02: qty 2

## 2017-03-02 MED ORDER — OXYCODONE HCL 5 MG PO TABS
5.0000 mg | ORAL_TABLET | ORAL | Status: DC | PRN
  Administered 2017-03-04: 5 mg via ORAL
  Administered 2017-03-05: 10 mg via ORAL
  Filled 2017-03-02: qty 2
  Filled 2017-03-02 (×2): qty 1
  Filled 2017-03-02: qty 2

## 2017-03-02 MED ORDER — DEXTROSE 5 % IV SOLN
INTRAVENOUS | Status: AC
  Filled 2017-03-02: qty 1.5

## 2017-03-02 MED ORDER — FENTANYL 40 MCG/ML IV SOLN
INTRAVENOUS | Status: AC
  Administered 2017-03-02: 15 ug via INTRAVENOUS
  Administered 2017-03-02: 1000 ug via INTRAVENOUS
  Administered 2017-03-02: 75 ug via INTRAVENOUS
  Administered 2017-03-03: 15 ug via INTRAVENOUS
  Administered 2017-03-04: 15 mL via INTRAVENOUS
  Administered 2017-03-04: 0 mL via INTRAVENOUS
  Filled 2017-03-02 (×2): qty 25

## 2017-03-02 MED ORDER — DIPHENHYDRAMINE HCL 50 MG/ML IJ SOLN: 12.5000 mg | Freq: Four times a day (QID) | INTRAMUSCULAR | Status: DC | PRN

## 2017-03-02 MED ORDER — FENTANYL CITRATE (PF) 250 MCG/5ML IJ SOLN
INTRAMUSCULAR | Status: AC
  Filled 2017-03-02: qty 5

## 2017-03-02 MED ORDER — MIDAZOLAM HCL 2 MG/2ML IJ SOLN
INTRAMUSCULAR | Status: DC | PRN
  Administered 2017-03-02 (×2): 2 mg via INTRAVENOUS

## 2017-03-02 MED ORDER — DIPHENHYDRAMINE HCL 12.5 MG/5ML PO ELIX
12.5000 mg | ORAL_SOLUTION | Freq: Four times a day (QID) | ORAL | Status: DC | PRN
  Filled 2017-03-02: qty 5

## 2017-03-02 MED ORDER — TOPIRAMATE 25 MG PO TABS: 50.0000 mg | ORAL_TABLET | Freq: Two times a day (BID) | ORAL | Status: DC

## 2017-03-02 MED ORDER — ROCURONIUM BROMIDE 100 MG/10ML IV SOLN
INTRAVENOUS | Status: DC | PRN
  Administered 2017-03-02: 60 mg via INTRAVENOUS

## 2017-03-02 MED ORDER — PHENYLEPHRINE HCL 10 MG/ML IJ SOLN
INTRAMUSCULAR | Status: DC | PRN
  Administered 2017-03-02: 20 ug/min via INTRAVENOUS

## 2017-03-02 MED ORDER — DEXAMETHASONE SODIUM PHOSPHATE 10 MG/ML IJ SOLN
INTRAMUSCULAR | Status: DC | PRN
  Administered 2017-03-02: 10 mg via INTRAVENOUS

## 2017-03-02 MED ORDER — FENTANYL CITRATE (PF) 250 MCG/5ML IJ SOLN
INTRAMUSCULAR | Status: DC | PRN
  Administered 2017-03-02: 100 ug via INTRAVENOUS
  Administered 2017-03-02: 150 ug via INTRAVENOUS
  Administered 2017-03-02 (×2): 50 ug via INTRAVENOUS
  Administered 2017-03-02: 100 ug via INTRAVENOUS
  Administered 2017-03-02: 50 ug via INTRAVENOUS

## 2017-03-02 MED ORDER — FLUTICASONE FUROATE-VILANTEROL 100-25 MCG/INH IN AEPB
1.0000 | INHALATION_SPRAY | Freq: Every day | RESPIRATORY_TRACT | Status: DC
  Administered 2017-03-03 – 2017-03-05 (×3): 1 via RESPIRATORY_TRACT
  Filled 2017-03-02: qty 28

## 2017-03-02 MED ORDER — BUPIVACAINE HCL (PF) 0.5 % IJ SOLN
INTRAMUSCULAR | Status: AC
  Filled 2017-03-02: qty 10

## 2017-03-02 MED ORDER — BUPIVACAINE HCL (PF) 0.5 % IJ SOLN
INTRAMUSCULAR | Status: DC | PRN
  Administered 2017-03-02: 5 mL

## 2017-03-02 MED ORDER — KETOROLAC TROMETHAMINE 30 MG/ML IJ SOLN
INTRAMUSCULAR | Status: AC
  Administered 2017-03-02: 30 mg via INTRAVENOUS
  Filled 2017-03-02: qty 1

## 2017-03-02 MED ORDER — KETOROLAC TROMETHAMINE 30 MG/ML IJ SOLN
30.0000 mg | Freq: Four times a day (QID) | INTRAMUSCULAR | Status: AC
  Administered 2017-03-02 – 2017-03-04 (×8): 30 mg via INTRAVENOUS
  Filled 2017-03-02 (×7): qty 1

## 2017-03-02 MED ORDER — FLUTICASONE FUROATE-VILANTEROL 100-25 MCG/INH IN AEPB: 1.0000 | INHALATION_SPRAY | Freq: Every day | RESPIRATORY_TRACT | Status: DC

## 2017-03-02 MED ORDER — PHENYLEPHRINE 40 MCG/ML (10ML) SYRINGE FOR IV PUSH (FOR BLOOD PRESSURE SUPPORT)
PREFILLED_SYRINGE | INTRAVENOUS | Status: DC | PRN
  Administered 2017-03-02: 40 ug via INTRAVENOUS

## 2017-03-02 MED ORDER — SENNOSIDES-DOCUSATE SODIUM 8.6-50 MG PO TABS
1.0000 | ORAL_TABLET | Freq: Every day | ORAL | Status: DC
  Administered 2017-03-03 – 2017-03-04 (×2): 1 via ORAL
  Filled 2017-03-02 (×2): qty 1

## 2017-03-02 MED ORDER — ONDANSETRON HCL 4 MG/2ML IJ SOLN: 4.0000 mg | Freq: Four times a day (QID) | INTRAMUSCULAR | Status: DC | PRN

## 2017-03-02 MED ORDER — LACTATED RINGERS IV SOLN
INTRAVENOUS | Status: DC
  Administered 2017-03-02: 10:00:00 via INTRAVENOUS

## 2017-03-02 MED ORDER — BUPIVACAINE 0.5 % ON-Q PUMP SINGLE CATH 400 ML
400.0000 mL | INJECTION | Status: AC
  Administered 2017-03-02: 400 mL
  Filled 2017-03-02 (×2): qty 400

## 2017-03-02 MED ORDER — NEOSTIGMINE METHYLSULFATE 10 MG/10ML IV SOLN
INTRAVENOUS | Status: DC | PRN
  Administered 2017-03-02: 4 mg via INTRAVENOUS

## 2017-03-02 MED ORDER — SODIUM CHLORIDE 0.9% FLUSH: 9.0000 mL | INTRAVENOUS | Status: DC | PRN

## 2017-03-02 MED ORDER — POTASSIUM CHLORIDE 10 MEQ/50ML IV SOLN: 10.0000 meq | Freq: Every day | INTRAVENOUS | Status: DC | PRN

## 2017-03-02 MED ORDER — PROPOFOL 10 MG/ML IV BOLUS
INTRAVENOUS | Status: DC | PRN
  Administered 2017-03-02: 100 mg via INTRAVENOUS
  Administered 2017-03-02: 200 mg via INTRAVENOUS

## 2017-03-02 MED ORDER — ACETAMINOPHEN 500 MG PO TABS
1000.0000 mg | ORAL_TABLET | Freq: Four times a day (QID) | ORAL | Status: DC
  Administered 2017-03-02 – 2017-03-05 (×10): 1000 mg via ORAL
  Filled 2017-03-02 (×10): qty 2

## 2017-03-02 MED ORDER — PNEUMOCOCCAL VAC POLYVALENT 25 MCG/0.5ML IJ INJ
0.5000 mL | INJECTION | INTRAMUSCULAR | Status: DC | PRN
Start: 2017-03-04 — End: 2017-03-05

## 2017-03-02 MED ORDER — ONDANSETRON HCL 4 MG/2ML IJ SOLN
4.0000 mg | Freq: Four times a day (QID) | INTRAMUSCULAR | Status: DC | PRN
  Administered 2017-03-03 – 2017-03-04 (×5): 4 mg via INTRAVENOUS
  Filled 2017-03-02 (×5): qty 2

## 2017-03-02 MED ORDER — NEOSTIGMINE METHYLSULFATE 5 MG/5ML IV SOSY
PREFILLED_SYRINGE | INTRAVENOUS | Status: AC
  Filled 2017-03-02: qty 5

## 2017-03-02 MED ORDER — 0.9 % SODIUM CHLORIDE (POUR BTL) OPTIME
TOPICAL | Status: DC | PRN
  Administered 2017-03-02 (×2): 1000 mL

## 2017-03-02 SURGICAL SUPPLY — 90 items
BENZOIN TINCTURE PRP APPL 2/3 (GAUZE/BANDAGES/DRESSINGS) ×3 IMPLANT
CANISTER SUCT 3000ML PPV (MISCELLANEOUS) ×6 IMPLANT
CATH KIT ON Q 5IN SLV (PAIN MANAGEMENT) IMPLANT
CATH KIT ON-Q SILVERSOAK 5IN (CATHETERS) ×3 IMPLANT
CATH THORACIC 28FR (CATHETERS) ×3 IMPLANT
CATH THORACIC 36FR (CATHETERS) IMPLANT
CATH THORACIC 36FR RT ANG (CATHETERS) IMPLANT
CLIP VESOCCLUDE MED 6/CT (CLIP) ×3 IMPLANT
CONN ST 1/4X3/8  BEN (MISCELLANEOUS)
CONN ST 1/4X3/8 BEN (MISCELLANEOUS) IMPLANT
CONN Y 3/8X3/8X3/8  BEN (MISCELLANEOUS)
CONN Y 3/8X3/8X3/8 BEN (MISCELLANEOUS) IMPLANT
CONT SPEC 4OZ CLIKSEAL STRL BL (MISCELLANEOUS) ×27 IMPLANT
COVER SURGICAL LIGHT HANDLE (MISCELLANEOUS) ×3 IMPLANT
CUTTER ECHEON FLEX ENDO 45 340 (ENDOMECHANICALS) ×3 IMPLANT
DERMABOND ADVANCED (GAUZE/BANDAGES/DRESSINGS) ×2
DERMABOND ADVANCED .7 DNX12 (GAUZE/BANDAGES/DRESSINGS) ×1 IMPLANT
DRAIN CHANNEL 28F RND 3/8 FF (WOUND CARE) IMPLANT
DRAIN CHANNEL 32F RND 10.7 FF (WOUND CARE) IMPLANT
DRAPE LAPAROSCOPIC ABDOMINAL (DRAPES) ×3 IMPLANT
DRAPE WARM FLUID 44X44 (DRAPE) ×3 IMPLANT
ELECT BLADE 6.5 EXT (BLADE) ×3 IMPLANT
ELECT CAUTERY BLADE 6.4 (BLADE) ×3 IMPLANT
ELECT REM PT RETURN 9FT ADLT (ELECTROSURGICAL) ×3
ELECTRODE REM PT RTRN 9FT ADLT (ELECTROSURGICAL) ×1 IMPLANT
GAUZE SPONGE 4X4 12PLY STRL (GAUZE/BANDAGES/DRESSINGS) IMPLANT
GAUZE SPONGE 4X4 12PLY STRL LF (GAUZE/BANDAGES/DRESSINGS) ×3 IMPLANT
GLOVE BIO SURGEON STRL SZ 6.5 (GLOVE) ×2 IMPLANT
GLOVE BIO SURGEONS STRL SZ 6.5 (GLOVE) ×1
GLOVE SURG SIGNA 7.5 PF LTX (GLOVE) ×6 IMPLANT
GLOVE SURG SS PI 7.0 STRL IVOR (GLOVE) ×3 IMPLANT
GOWN STRL REUS W/ TWL LRG LVL3 (GOWN DISPOSABLE) ×1 IMPLANT
GOWN STRL REUS W/ TWL XL LVL3 (GOWN DISPOSABLE) ×2 IMPLANT
GOWN STRL REUS W/TWL LRG LVL3 (GOWN DISPOSABLE) ×2
GOWN STRL REUS W/TWL XL LVL3 (GOWN DISPOSABLE) ×4
HANDLE STAPLE ENDO GIA SHORT (STAPLE)
HEMOSTAT SURGICEL 2X14 (HEMOSTASIS) IMPLANT
KIT BASIN OR (CUSTOM PROCEDURE TRAY) ×3 IMPLANT
KIT ROOM TURNOVER OR (KITS) ×3 IMPLANT
KIT SUCTION CATH 14FR (SUCTIONS) ×3 IMPLANT
NS IRRIG 1000ML POUR BTL (IV SOLUTION) ×6 IMPLANT
PACK CHEST (CUSTOM PROCEDURE TRAY) ×3 IMPLANT
PAD ARMBOARD 7.5X6 YLW CONV (MISCELLANEOUS) ×6 IMPLANT
POUCH ENDO CATCH II 15MM (MISCELLANEOUS) IMPLANT
POUCH SPECIMEN RETRIEVAL 10MM (ENDOMECHANICALS) IMPLANT
SCISSORS ENDO CVD 5DCS (MISCELLANEOUS) IMPLANT
SEALANT PROGEL (MISCELLANEOUS) IMPLANT
SEALANT SURG COSEAL 4ML (VASCULAR PRODUCTS) IMPLANT
SEALANT SURG COSEAL 8ML (VASCULAR PRODUCTS) IMPLANT
SHEARS HARMONIC HDI 20CM (ELECTROSURGICAL) IMPLANT
SOLUTION ANTI FOG 6CC (MISCELLANEOUS) ×3 IMPLANT
SPECIMEN JAR MEDIUM (MISCELLANEOUS) ×3 IMPLANT
SPONGE INTESTINAL PEANUT (DISPOSABLE) ×6 IMPLANT
SPONGE TONSIL 1 RF SGL (DISPOSABLE) ×3 IMPLANT
STAPLE RELOAD 45MM GOLD (STAPLE) ×24 IMPLANT
STAPLER ENDO GIA 12MM SHORT (STAPLE) IMPLANT
SUT PROLENE 4 0 RB 1 (SUTURE)
SUT PROLENE 4-0 RB1 .5 CRCL 36 (SUTURE) IMPLANT
SUT SILK  1 MH (SUTURE) ×4
SUT SILK 1 MH (SUTURE) ×2 IMPLANT
SUT SILK 1 TIES 10X30 (SUTURE) ×3 IMPLANT
SUT SILK 2 0 SH (SUTURE) IMPLANT
SUT SILK 2 0SH CR/8 30 (SUTURE) IMPLANT
SUT SILK 3 0 SH 30 (SUTURE) IMPLANT
SUT SILK 3 0SH CR/8 30 (SUTURE) ×6 IMPLANT
SUT VIC AB 0 CTX 27 (SUTURE) IMPLANT
SUT VIC AB 1 CTX 27 (SUTURE) ×6 IMPLANT
SUT VIC AB 2-0 CT1 27 (SUTURE)
SUT VIC AB 2-0 CT1 TAPERPNT 27 (SUTURE) IMPLANT
SUT VIC AB 2-0 CTX 36 (SUTURE) ×6 IMPLANT
SUT VIC AB 3-0 MH 27 (SUTURE) IMPLANT
SUT VIC AB 3-0 SH 27 (SUTURE)
SUT VIC AB 3-0 SH 27X BRD (SUTURE) IMPLANT
SUT VIC AB 3-0 X1 27 (SUTURE) ×3 IMPLANT
SUT VICRYL 0 UR6 27IN ABS (SUTURE) IMPLANT
SUT VICRYL 2 TP 1 (SUTURE) IMPLANT
SWAB CULTURE ESWAB REG 1ML (MISCELLANEOUS) IMPLANT
SYSTEM SAHARA CHEST DRAIN ATS (WOUND CARE) ×3 IMPLANT
TAPE CLOTH SURG 4X10 WHT LF (GAUZE/BANDAGES/DRESSINGS) ×3 IMPLANT
TIP APPLICATOR SPRAY EXTEND 16 (VASCULAR PRODUCTS) IMPLANT
TOWEL GREEN STERILE (TOWEL DISPOSABLE) ×3 IMPLANT
TOWEL GREEN STERILE FF (TOWEL DISPOSABLE) ×3 IMPLANT
TRAY FOLEY W/METER SILVER 16FR (SET/KITS/TRAYS/PACK) ×3 IMPLANT
TROCAR XCEL BLADELESS 5X75MML (TROCAR) ×3 IMPLANT
TROCAR XCEL NON-BLD 5MMX100MML (ENDOMECHANICALS) IMPLANT
TUBE CONNECTING 12'X1/4 (SUCTIONS) ×1
TUBE CONNECTING 12X1/4 (SUCTIONS) ×2 IMPLANT
TUNNELER SHEATH ON-Q 11GX8 DSP (PAIN MANAGEMENT) ×3 IMPLANT
WATER STERILE IRR 1000ML POUR (IV SOLUTION) ×6 IMPLANT
YANKAUER SUCT BULB TIP NO VENT (SUCTIONS) ×3 IMPLANT

## 2017-03-02 NOTE — Anesthesia Postprocedure Evaluation (Signed)
Anesthesia Post Note  Patient: Christyan Reger  Procedure(s) Performed: LEFT VIDEO ASSISTED THORACOSCOPY (VATS)/LEFT LOWER WEDGE AND LEFT UPPER LOBE RESECTION  (Left Chest) LYMPH NODE BIOPSY (Left Chest)     Patient location during evaluation: PACU Anesthesia Type: General Level of consciousness: awake Pain management: pain level controlled Vital Signs Assessment: post-procedure vital signs reviewed and stable Respiratory status: spontaneous breathing Cardiovascular status: stable Anesthetic complications: no    Last Vitals:  Vitals:   03/02/17 1535 03/02/17 1538  BP: 126/80   Pulse: 94 93  Resp: 14 15  Temp:    SpO2: 99% 99%    Last Pain:  Vitals:   03/02/17 1430  TempSrc:   PainSc: 10-Worst pain ever                 Dontaye Hur

## 2017-03-02 NOTE — Op Note (Signed)
NAME:  NAHEEM, MOSCO NO.:  [id number]  MEDICAL RECORD NO.:  [id number]  LOCATION:                                 FACILITY:  PHYSICIAN:  Revonda Standard. Roxan Hockey, M.D.DATE OF BIRTH:  07-06-1985  DATE OF PROCEDURE:  03/02/2017 DATE OF DISCHARGE:                              OPERATIVE REPORT   PREOPERATIVE DIAGNOSES:  Lung nodule and hilar adenopathy.  POSTOPERATIVE DIAGNOSES:  Lung nodule and hilar adenopathy.  PROCEDURES:   Left video-assisted thoracoscopy, Wedge resection of lower lobe and upper lobe nodules, Lymph node sampling, and  On-Q local anesthetic catheter placement.  SURGEON:  Revonda Standard. Roxan Hockey, MD.  ASSISTING:  Jadene Pierini, PA-C.  ANESTHESIA:  General.  FINDINGS:  Approximately 8-mm nodule on the inferior margin of the lower lobe- necrotic, sent for AFB and fungal cultures as well as permanent pathology.  Fibrotic inflammatory reaction around nodes, but the nodes themselves appeared relatively normal in size and appearance.  CLINICAL NOTE:  Nathan Kerr is a 31 year old gentleman with a history of chronic myeloid leukemia.  He is a lifelong nonsmoker.  He has developed a cough and wheezing, which has been persistent over several months.  In June, he became very short of breath.  A CT showed left lower lobe bronchial obstruction and air trapping.  Bronchoscopy was nondiagnostic. On repeat CT, the extrinsic compression of the lower lobe bronchus and air trapping had improved.  He had multiple persistent lung nodules.  He was offered VATS for wedge resection and lymph node biopsy.  The indications, risks, benefits, and alternatives were discussed in detail with the patient.  He understood and accepted the risks and agreed to proceed.  OPERATIVE NOTE:  Nathan Kerr was brought to the preoperative holding area on March 02, 2017.  Anesthesia obtained intravenous access and placed an arterial blood pressure monitoring line.  He was taken to  the operating room, anesthetized, and intubated.  A Foley catheter was placed.  Sequential compression devices were placed on the calves for DVT prophylaxis.  Intravenous antibiotics were administered.  He was anesthetized and intubated with a double-lumen endotracheal tube.  He was placed in a right lateral decubitus position, and the left chest was prepped and draped in usual sterile fashion.  Single lung ventilation of the right lung was initiated and it was tolerated well throughout the procedure.  After performing a time-out, an incision was made in the seventh interspace in the midaxillary line.  A-5 mm port was inserted into the chest.  The thoracoscope was advanced into the chest.  There was good isolation of the left lung.  There was no pleural effusion.  A 4-cm working incision was made in the fifth interspace anterolaterally.  No rib spreading was performed during the procedure.  The inferior margin of the lower lobe was inspected and there was an easily visible nodule approximately 8 mm in diameter along the inferior margin, consistent with the lung nodule seen on CT scan.  A wedge resection was performed with sequential firings of an Echelon endoscopic powered stapler using 45-mm gold cartridges.  The nodule was removed from the chest.  A small portion of this was cut off to  send for AFB and fungal cultures.  There was necrosis.  The remainder was sent for permanent pathology.  The scope was reinserted.  Further inspection of the lung revealed a parenchymal lymph node that appeared larger than the other parenchymal nodes.  This was on the posterior aspect of the left upper lobe.  A wedge resection was performed again with sequential firings of the 45-mm stapler.  This specimen was also sent for permanent pathology.  The inferior ligament was divided.  A level 9 node appeared relatively normal.  It was removed. All lymph nodes that were removed were sent for permanent  pathology only.  The pleural reflection was divided at the hilum anteriorly and posteriorly.  Superiorly and anteriorly, the pleura overlying the aortopulmonary window was incised.  A relatively normal- appearing level 5 node was removed.  Minimal cautery was used in this area.  The fissure was completed anteriorly with sequential firings of the Echelon stapler to allow access to the bifurcation of the upper and lower lobe bronchi and level 11 nodes.  There was an intense inflammatory reaction in this area, although the nodes themselves were relatively normal in size.  The bronchus itself had a normal appearance. Posteriorly, level 12 nodes were removed along the lower lobe bronchus, again with fibrotic inflammatory reaction around the nodes, but the nodes themselves appearing relatively normal.  Chest was copiously irrigated with warm saline.  There was no bleeding noted.  A 28-French chest tube was placed through the port incision and secured with #1 silk suture.  An On-Q local anesthetic catheter was placed through a separate stab incision posteriorly and tunneled into a subpleural location.  It was primed with 5 mL of 0.5% Marcaine.  The left lung was reinflated. The working incision was closed in standard fashion.  The chest tube was placed to suction.  The patient was placed back in a supine position. He was extubated in the operating room and taken to the post-anesthetic care unit in good condition.     Revonda Standard Roxan Hockey, M.D.     SCH/MEDQ  D:  03/02/2017  T:  03/02/2017  Job:  [id number]

## 2017-03-02 NOTE — Progress Notes (Signed)
Patient's Advanced Directives has been completed this morning with volunteers and notary.    03/02/17 0955  Clinical Encounter Type  Visited With Patient  Visit Type Initial;Social support;Spiritual support

## 2017-03-02 NOTE — H&P (View-Only) (Signed)
PCP is Golden Circle, FNP Referring Provider is Rigoberto Noel, MD  Chief Complaint  Patient presents with  . Mass    CT Chest 12/27/2016    HPI: Nathan Kerr is a 31 year old man sent for consultation regarding lung nodules a left hilar adenopathy.  Nathan Kerr is a 31 year old man with a history of chronic myeloid leukemia and childhood asthma. He is a lifelong nonsmoker. He also has a history of migraines. His CML is currently in remission. About 6 months ago he was having problems with headaches and nasal congestion. He then developed congestion and his chest. He had coughing and wheezing. One day in June he was very short of breath and went to an urgent care. A CT of the chest was done. It showed left lower lobe bronchial obstruction an air trapping.There was a circumferential soft tissue density. He saw pulmonary and had a bronchoscopy by Dr. Elsworth Soho. It showed extrinsic compression of the left lower lobe bronchus.sampling was nondiagnostic and cultures were negative. He was started on prednisone.  Rheumatologic workup was performed ANCA was negative and he had a weakly positive ANA.  His shortness of breath and wheezing improved but he continues to have bifrontal headaches. He's lost about 10-15 pounds over the past 3 months. He continues to have decreased energy and poor appetite. He also complains of blurry vision. An MR of the head showed chronic sinusitis changes.  Zubrod Score: At the time of surgery this patient's most appropriate activity status/level should be described as: []     0    Normal activity, no symptoms [x]     1    Restricted in physical strenuous activity but ambulatory, able to do out light work []     2    Ambulatory and capable of self care, unable to do work activities, up and about >50 % of waking hours                              []     3    Only limited self care, in bed greater than 50% of waking hours []     4    Completely disabled, no self care, confined to  bed or chair []     5    Moribund  Past Medical History:  Diagnosis Date  . Allergy   . Asthma    Childhood, not currently active  . Chronic myeloid leukemia in remission ([place])   . CML (chronic myelocytic leukemia) ([place])   . Leukemia ([place]) 2010  . Migraine     Past Surgical History:  Procedure Laterality Date  . APPENDECTOMY    . BRONCHOSCOPY    . VIDEO BRONCHOSCOPY Bilateral 10/23/2016   Procedure: VIDEO BRONCHOSCOPY WITHOUT FLUORO;  Surgeon: Rigoberto Noel, MD;  Location: [facility];  Service: Cardiopulmonary;  Laterality: Bilateral;    Family History  Problem Relation Age of Onset  . Sleep apnea Mother   . Migraines Father     Social History Social History  Substance Use Topics  . Smoking status: Never Smoker  . Smokeless tobacco: Never Used     Comment: occas cigar  . Alcohol use Yes     Comment: very occasional whiskey    Current Outpatient Prescriptions  Medication Sig Dispense Refill  . acetaminophen (TYLENOL) 500 MG tablet Take 1,000 mg by mouth every 6 (six) hours as needed for mild pain.    [name] acetaminophen-codeine (TYLENOL #3) 300-30 MG  tablet Take 1 tablet by mouth every 4 (four) hours as needed for moderate pain. 30 tablet 0  . cetirizine (ZYRTEC) 10 MG tablet Take 10 mg by mouth daily.    . clindamycin (CLEOCIN T) 1 % lotion Apply topically 2 (two) times daily. While on steroids. 60 mL 0  . fluticasone furoate-vilanterol (BREO ELLIPTA) 100-25 MCG/INH AEPB Inhale 1 puff into the lungs daily. 1 each 3  . ibuprofen (ADVIL,MOTRIN) 200 MG tablet Take 400-600 mg by mouth every 6 (six) hours as needed.    . meloxicam (MOBIC) 15 MG tablet Take 1 tablet (15 mg total) by mouth daily. PATIENT NEEDS OFFICE VISIT FOR ADDITIONAL REFILLS 30 tablet 3  . nilotinib (TASIGNA) 150 MG capsule Take 2 capsules (300 mg total) by mouth every 12 (twelve) hours. 360 capsule 0  . oxycodone-acetaminophen (PERCOCET) 2.5-325 MG tablet Take 1 tablet by mouth every 4 (four) hours as  needed for pain.    . predniSONE (DELTASONE) 10 MG tablet Take 2 tablets (20 mg total) by mouth daily with breakfast. 60 tablet 2  . PROAIR HFA 108 (90 Base) MCG/ACT inhaler Inhale 2 puffs into the lungs every 4 (four) hours as needed for wheezing. 1 Inhaler 1  . rizatriptan (MAXALT-MLT) 10 MG disintegrating tablet Take 1 tablet (10 mg total) by mouth as needed for migraine. May repeat in 2 hours if needed 9 tablet 11  . sertraline (ZOLOFT) 50 MG tablet Take 1 tablet (50 mg total) by mouth daily. 30 tablet 1  . SUMAtriptan (IMITREX) 100 MG tablet Take 1 tablet at onset of headache. May repeat in 2 hrs if needed, MAX 2 tabs/24 hrs. 9 tablet 5  . topiramate (TOPAMAX) 50 MG tablet Take 1 tablet (50 mg total) by mouth 2 (two) times daily. 60 tablet 12   Current Facility-Administered Medications  Medication Dose Route Frequency Provider Last Rate Last Dose  . ondansetron (ZOFRAN-ODT) disintegrating tablet 8 mg  8 mg Oral Once Leandrew Koyanagi, MD        No Known Allergies  Review of Systems  Constitutional: Positive for activity change, appetite change and unexpected weight change (lost 10 to 15 pounds in 3 months). Negative for chills and fever.  HENT: Negative for trouble swallowing and voice change.   Eyes: Positive for visual disturbance.  Respiratory: Positive for cough (resolved), shortness of breath (resolved) and wheezing (resolved).   Gastrointestinal: Negative for abdominal pain and blood in stool.  Genitourinary: Negative.   Musculoskeletal: Negative.  Negative for arthralgias and myalgias.  Neurological: Positive for headaches. Negative for seizures and syncope.  Hematological: Negative for adenopathy. Does not bruise/bleed easily.  Psychiatric/Behavioral: Positive for dysphoric mood.  All other systems reviewed and are negative.   BP 129/87   Pulse 80   Ht 5\' 10"  (1.778 m)   Wt 200 lb (90.7 kg)   SpO2 98%   BMI 28.70 kg/m  Physical Exam  Constitutional: He is oriented  to person, place, and time. He appears well-developed and well-nourished. No distress.  HENT:  Head: Normocephalic and atraumatic.  Mouth/Throat: No oropharyngeal exudate.  Eyes: Conjunctivae and EOM are normal. No scleral icterus.  Neck: No thyromegaly present.  Cardiovascular: Normal rate and regular rhythm.   Murmur (faint systolic) heard. Pulmonary/Chest: Effort normal and breath sounds normal. No respiratory distress. He has no wheezes. He has no rales.  Abdominal: Soft. He exhibits no distension. There is no tenderness.  Musculoskeletal: He exhibits no edema or deformity.  Lymphadenopathy:  He has no cervical adenopathy.  Neurological: He is alert and oriented to person, place, and time. No cranial nerve deficit. He exhibits normal muscle tone.  Skin: Skin is warm and dry.  Vitals reviewed.    Diagnostic Tests: CT CHEST WITH CONTRAST  TECHNIQUE: Multidetector CT imaging of the chest was performed during intravenous contrast administration.  CONTRAST:  75 cc of Isovue-300  COMPARISON:  10/21/2016  FINDINGS: Cardiovascular: The heart size appears within normal limits. There is no pericardial effusion.  Mediastinum/Nodes: The trachea appears patent and is midline. Normal appearance of the esophagus. 2.1 cm left hilar mass is identified which encases/narrows the left lower lobe bronchus. Previously this measured 2.3 cm.  Lungs/Pleura: Pulmonary nodule within the subpleural right lower lobe measures 8 mm, image 97 of series 7. New from previous exam. Subpleural nodule within the right middle lobe measures 8 mm and is unchanged compared with previous exam, image 89 of series 7. New nodule within the left lower lobe measures 8 mm, image 108 of series 7.  Upper Abdomen: 7 mm low-density structure in the posterior right lobe of liver is unchanged from previous exam. This remains too small to reliably characterize.  Musculoskeletal: No aggressive lytic or  sclerotic bone lesions.  IMPRESSION: 1. Left perihilar mass narrowing the left lower lobe bronchus is slightly decreased in size in the interval. There is continued narrowing of the left lower lobe bronchus. 2. Bilateral pulmonary nodules are identified and progressed from previous exam.   Electronically Signed   By: Kerby Moors M.D.   On: 12/27/2016 13:12 I personally reviewed the CT images also reviewed them with Nathan Kerr. I concur with the findings noted above  Impression: Nathan Kerr is a 31 year old gentleman with a history of chronic myeloid leukemia who originally presented with cough, wheezing, and shortness of breath. A CT of the chest showed soft tissue density with extrinsic compression of the left lower lobe bronchus. There was definite air trapping in the left lower lobe. Bronchoscopy was nondiagnostic. Cultures were negative. He has been on prednisone and has cough, wheezing, and shortness of breath have improved. However his overall sense of well-being has not. He continues to have headaches, blurry vision, loss of appetite, and decreased energy. A repeat CT in August showed decrease in size of the soft tissue density around the lower lobe bronchus. I think the decrease is actually more than indicated by the overall change in measurement as the airway occupies much larger portion of this space that had previously. On the other hand he had new pulmonary nodules.  This is a very unusual case with multiple possible etiologies. Given his history of leukemia, the immediate concern is the possibility of lymphoma. The decrease in size of the left hilar abnormality would be unusual, but not impossible. Atypical infections could potentially cause this appearance. Finally sarcoidosis is a possibility although you wouldn't expect a differential decrease and hilar adenopathy at the same time that new lung nodules were being noted.  I discussed multiple potential options with Mr.  Kerr. 1. Continued radiographic observation. It's been about 6 weeks and is since his last scan so he would be due for another scan a couple of weeks.  2. Repeat bronchoscopy possibly with endobronchial ultrasound. I don't favor this is a think it is less likely be diagnostic than it was the first time.  3. CT-guided biopsy of one of the lung nodules. There very small and would likely be difficult to biopsy. A negative result would  not rule out any possibility. Attending that would be dependent on IR.  4. VATS for lung nodule and lymph node biopsy. Most likely to be definitive although there is no guarantee. Does involve a surgical procedure and a hospital stay. His case my advice would be to attempt a left VATS to do a wedge resection of a lung nodule and lymph node biopsy. I described the general nature of that procedure to him. He understands the need for general anesthesia, theincisions to be used, the use of a drainage tube postoperatively, the expected hospital stay, and the overall recovery. I informed him of the indications, risks, benefits, and alternatives. He understands the risk include, but are not limited to death (very small risk), DVT, PE, bleeding, possible need for transfusion, infection, prolonged air leak, as well as the possibility of other unforeseeable complications. He understands the degree of pain is variable.  He understandably needs some time to think about his options and discuss with his family before making a decision.  Plan: He will call and let us know how he would like to proceed. If he would like to come back for further discussion, I will be happy to meet with him and his family.  Melrose Nakayama, MD [facility] [phone]

## 2017-03-02 NOTE — Transfer of Care (Signed)
Immediate Anesthesia Transfer of Care Note  Patient: Arby Dahir  Procedure(s) Performed: LEFT VIDEO ASSISTED THORACOSCOPY (VATS)/LEFT LOWER WEDGE AND LEFT UPPER LOBE RESECTION  (Left Chest) LYMPH NODE BIOPSY (Left Chest)  Patient Location: PACU  Anesthesia Type:General  Level of Consciousness: awake, alert , oriented and patient cooperative  Airway & Oxygen Therapy: Patient Spontanous Breathing  Post-op Assessment: Report given to RN and Post -op Vital signs reviewed and stable  Post vital signs: Reviewed and stable  Last Vitals:  Vitals:   03/02/17 0856  BP: (!) 144/98  Pulse: 86  Resp: 20  Temp: 37 C  SpO2: 99%    Last Pain:  Vitals:   03/02/17 0856  TempSrc: Oral      Patients Stated Pain Goal: 3 (45/62/56 3893)  Complications: No apparent anesthesia complications

## 2017-03-02 NOTE — Anesthesia Procedure Notes (Signed)
Arterial Line Insertion Start/End10/26/2018 10:50 AM, 03/02/2017 10:55 AM Performed by: Josephine Igo, CRNA  Patient location: Pre-op. Preanesthetic checklist: patient identified, IV checked, site marked, risks and benefits discussed, surgical consent, monitors and equipment checked and pre-op evaluation Lidocaine 1% used for infiltration and patient sedated Right, radial was placed Catheter size: 20 G Maximum sterile barriers used  Allen's test indicative of satisfactory collateral circulation Attempts: 1 Procedure performed without using ultrasound guided technique. Following insertion, dressing applied. Post procedure assessment: normal and unchanged  Patient tolerated the procedure well with no immediate complications.

## 2017-03-02 NOTE — Brief Op Note (Addendum)
03/02/2017  1:46 PM  PATIENT:  Nathan Kerr  31 y.o. male  PRE-OPERATIVE DIAGNOSIS:  left lung nodules,hilar adenopathy  POST-OPERATIVE DIAGNOSIS:  left lung nodules,hilar adenopathy  PROCEDURE: LEFT VATS WEDGE RESECTION LEFT LOWER LOBE NODULE AND LEFT UPPER LOBE NODULE LYMPH NODE SAMPLING ON-Q LOCAL ANESTHETIC CATHETER PLACEMENT  SURGEON:  Surgeon(s) and Role:    * Melrose Nakayama, MD - Primary  PHYSICIAN ASSISTANT: WAYNE GOLD PA-C  ANESTHESIA:   general  EBL:  100 mL   BLOOD ADMINISTERED:none  DRAINS: 1 Chest Tube(s) in the [place] USED:  MARCAINE     SPECIMEN:  Source of Specimen:  LUL/LLL WEDGE,LN BIOPSY  DISPOSITION OF SPECIMEN:  PATH AND MICRO  COUNTS:  YES  TOURNIQUET:  * No tourniquets in log *  DICTATION: .Other Dictation: Dictation Number PENDING  PLAN OF CARE: Admit to inpatient   PATIENT DISPOSITION:  PACU STABLE   Delay start of Pharmacological VTE agent (>24hrs) due to surgical blood loss or risk of bleeding: yes  COMPLICATIONS: NO KNOWN '

## 2017-03-02 NOTE — Interval H&P Note (Signed)
History and Physical Interval Note:  03/02/2017 9:40 AM  Nathan Kerr  has presented today for surgery, with the diagnosis of left lung nodules,hilar adenopathy  The various methods of treatment have been discussed with the patient and family. After consideration of risks, benefits and other options for treatment, the patient has consented to  Procedure(s): VIDEO ASSISTED THORACOSCOPY (VATS)/WEDGE RESECTION,Lymph node biopsy (Left) LYMPH NODE BIOPSY (Left) as a surgical intervention .  The patient's history has been reviewed, patient examined, no change in status, stable for surgery.  I have reviewed the patient's chart and labs.  Questions were answered to the patient's satisfaction.     Melrose Nakayama

## 2017-03-02 NOTE — Anesthesia Preprocedure Evaluation (Signed)
Anesthesia Evaluation  Patient identified by MRN, date of birth, ID band Patient awake    Reviewed: Allergy & Precautions, NPO status , Patient's Chart, lab work & pertinent test results  Airway Mallampati: II  TM Distance: >3 FB     Dental   Pulmonary asthma ,    breath sounds clear to auscultation       Cardiovascular negative cardio ROS   Rhythm:Regular Rate:Normal     Neuro/Psych  Headaches,    GI/Hepatic negative GI ROS, Neg liver ROS,   Endo/Other  negative endocrine ROS  Renal/GU negative Renal ROS     Musculoskeletal   Abdominal   Peds  Hematology   Anesthesia Other Findings   Reproductive/Obstetrics                             Anesthesia Physical Anesthesia Plan  ASA: III  Anesthesia Plan: General   Post-op Pain Management:    Induction: Intravenous  PONV Risk Score and Plan: 2 and Ondansetron, Dexamethasone and Treatment may vary due to age or medical condition  Airway Management Planned: Oral ETT and Double Lumen EBT  Additional Equipment:   Intra-op Plan:   Post-operative Plan: Possible Post-op intubation/ventilation  Informed Consent: I have reviewed the patients History and Physical, chart, labs and discussed the procedure including the risks, benefits and alternatives for the proposed anesthesia with the patient or authorized representative who has indicated his/her understanding and acceptance.   Dental advisory given  Plan Discussed with: CRNA, Anesthesiologist and Surgeon  Anesthesia Plan Comments:         Anesthesia Quick Evaluation

## 2017-03-03 ENCOUNTER — Other Ambulatory Visit: Payer: Self-pay | Admitting: Oncology

## 2017-03-03 ENCOUNTER — Inpatient Hospital Stay (HOSPITAL_COMMUNITY): Payer: Managed Care, Other (non HMO)

## 2017-03-03 ENCOUNTER — Encounter (HOSPITAL_COMMUNITY): Payer: Self-pay | Admitting: Thoracic Surgery (Cardiothoracic Vascular Surgery)

## 2017-03-03 LAB — BASIC METABOLIC PANEL
Anion gap: 8 (ref 5–15)
BUN: 5 mg/dL — AB (ref 6–20)
CALCIUM: 8.7 mg/dL — AB (ref 8.9–10.3)
CO2: 22 mmol/L (ref 22–32)
Chloride: 107 mmol/L (ref 101–111)
Creatinine, Ser: 0.96 mg/dL (ref 0.61–1.24)
GFR calc Af Amer: >60 mL/min (ref >60)
GLUCOSE: 150 mg/dL — AB (ref 65–99)
Potassium: 3.3 mmol/L — ABNORMAL LOW (ref 3.5–5.1)
Sodium: 137 mmol/L (ref 135–145)

## 2017-03-03 LAB — BLOOD GAS, ARTERIAL
ACID-BASE DEFICIT: 2 mmol/L (ref 0.0–2.0)
BICARBONATE: 22.2 mmol/L (ref 20.0–28.0)
Drawn by: 283401
FIO2: 0.21
O2 SAT: 97 %
PATIENT TEMPERATURE: 98.6
pCO2 arterial: 36.8 mmHg (ref 32.0–48.0)
pH, Arterial: 7.397 (ref 7.350–7.450)
pO2, Arterial: 91.2 mmHg (ref 83.0–108.0)

## 2017-03-03 LAB — CBC
HCT: 32.4 % — ABNORMAL LOW (ref 39.0–52.0)
Hemoglobin: 10.3 g/dL — ABNORMAL LOW (ref 13.0–17.0)
MCH: 25.6 pg — AB (ref 26.0–34.0)
MCHC: 31.8 g/dL (ref 30.0–36.0)
MCV: 80.4 fL (ref 78.0–100.0)
PLATELETS: 87 10*3/uL — AB (ref 150–400)
RBC: 4.03 MIL/uL — ABNORMAL LOW (ref 4.22–5.81)
RDW: 17.8 % — AB (ref 11.5–15.5)
WBC: 24.3 10*3/uL — AB (ref 4.0–10.5)

## 2017-03-03 MED ORDER — POTASSIUM CHLORIDE CRYS ER 20 MEQ PO TBCR
40.0000 meq | EXTENDED_RELEASE_TABLET | Freq: Once | ORAL | Status: AC
  Administered 2017-03-03: 40 meq via ORAL

## 2017-03-03 MED ORDER — POTASSIUM CHLORIDE CRYS ER 10 MEQ PO TBCR
EXTENDED_RELEASE_TABLET | ORAL | Status: AC
  Administered 2017-03-03: 40 meq
  Filled 2017-03-03: qty 4

## 2017-03-03 NOTE — Progress Notes (Signed)
COURTESY NOTE: Asked by pharmacy to confirm that patient's nilotinib should be continued.  -- This is a 31 y/o [place] man followed by Dr Alen Blew for [facility]. He has a prior history of noncompliance and currently his counts show a high WBC, moderate anemia and thrombocytopenia.  Please continue or resume nilotinib at 300 mg BID.  I will write for a bcr.abl study and alert Dr Eldridge Scot re admission  GM

## 2017-03-03 NOTE — Progress Notes (Addendum)
[address]       [place] [address]             [phone]      1 Day Post-Op Procedure(s) (LRB): LEFT VIDEO ASSISTED THORACOSCOPY (VATS)/LEFT LOWER WEDGE AND LEFT UPPER LOBE RESECTION  (Left) LYMPH NODE BIOPSY (Left) Subjective: Mild discomfort , some nausea-resolved  Objective: Vital signs in last 24 hours: Temp:  [97.8 F (36.6 C)-98.3 F (36.8 C)] 98.2 F (36.8 C) (10/27 0848) Pulse Rate:  [57-101] 94 (10/27 0848) Cardiac Rhythm: Normal sinus rhythm (10/27 0848) Resp:  [10-36] 18 (10/27 0800) BP: (104-132)/(66-85) 104/66 (10/27 0848) SpO2:  [95 %-100 %] 99 % (10/27 0848) Arterial Line BP: (109-140)/(64-72) 119/68 (10/27 0355) Weight:  [202 lb 13.2 oz (92 kg)] 202 lb 13.2 oz (92 kg) (10/26 1600)  Hemodynamic parameters for last 24 hours:    Intake/Output from previous day: 10/26 0701 - 10/27 0700 In: 4997.5 [P.O.:1200; I.V.:3667.5; IV Piggyback:100] Out: 2820 [Urine:2375; Blood:150; Chest Tube:295] Intake/Output this shift: Total I/O In: 790 [P.O.:360; I.V.:430] Out: 1000 [Urine:1000]  General appearance: alert, cooperative and no distress Heart: regular rate and rhythm Lungs: dim in bases Abdomen: benign Extremities: no edema or calf tenderness Wound: incis healing well  Lab Results:  Recent Labs  02/28/17 1500 03/03/17 0543  WBC 21.1* 24.3*  HGB 13.6 10.3*  HCT 40.8 32.4*  PLT 116* 87*   BMET:  Recent Labs  02/28/17 1500 03/03/17 0543  NA 139 137  K 3.3* 3.3*  CL 109 107  CO2 19* 22  GLUCOSE 105* 150*  BUN 9 5*  CREATININE 1.24 0.96  CALCIUM 9.7 8.7*    PT/INR:  Recent Labs  02/28/17 1500  LABPROT 14.1  INR 1.10   ABG    Component Value Date/Time   PHART 7.397 03/03/2017 0432   HCO3 22.2 03/03/2017 0432   TCO2 25 08/12/2008 1651   ACIDBASEDEF 2.0 03/03/2017 0432   O2SAT 97.0 03/03/2017 0432   CBG (last 3)  No results for input(s): GLUCAP in the last 72 hours.  Meds Scheduled Meds: .  acetaminophen  1,000 mg Oral Q6H   Or  . acetaminophen (TYLENOL) oral liquid 160 mg/5 mL  1,000 mg Oral Q6H  . bisacodyl  10 mg Oral Daily  . enoxaparin (LOVENOX) injection  40 mg Subcutaneous Q24H  . fentaNYL   Intravenous Q4H  . fluticasone furoate-vilanterol  1 puff Inhalation Daily  . ketorolac  30 mg Intravenous Q6H  . nilotinib  300 mg Oral Q12H  . pantoprazole  40 mg Oral Daily  . predniSONE  10 mg Oral Q breakfast  . senna-docusate  1 tablet Oral QHS  . sertraline  50 mg Oral Daily  . topiramate  50 mg Oral BID   Continuous Infusions: . cefUROXime (ZINACEF)  IV Stopped (03/03/17 0004)  . dextrose 5 % and 0.9% NaCl 100 mL/hr at 03/03/17 0800  . potassium chloride     PRN Meds:.albuterol, diphenhydrAMINE **OR** diphenhydrAMINE, naloxone **AND** sodium chloride flush, ondansetron (ZOFRAN) IV, oxyCODONE, [START ON 03/04/2017] pneumococcal 23 valent vaccine, potassium chloride, traMADol  Xrays Dg Chest Port 1 View  Result Date: 03/02/2017 CLINICAL DATA:  Left chest tube. EXAM: PORTABLE CHEST 1 VIEW COMPARISON:  02/28/2017. FINDINGS: Left chest tube noted. No pneumothorax. Postsurgical changes left lung. Mediastinum and hilar structures are normal. Heart size normal for portable technique. No pulmonary venous congestion. No pleural effusion. No acute bony abnormality . IMPRESSION: 1. Left chest tube noted.  No pneumothorax. Postsurgical changes left lung. 2.  Mild basilar atelectasis. Electronically Signed   By: Marcello Moores  Register   On: 03/02/2017 14:46    Assessment/Plan: S/P Procedure(s) (LRB): LEFT VIDEO ASSISTED THORACOSCOPY (VATS)/LEFT LOWER WEDGE AND LEFT UPPER LOBE RESECTION  (Left) LYMPH NODE BIOPSY (Left)  1 hemodyn stable in sinus rhythm 2 chest tube with minimal drainage and no air leak, CXR pending - place to H2O seal 3 d/c foley, reduce IVF 4 path pending, replace K+, some increase in chronic leukocytosis - monitor, expected ABL anemia- monitor 5 routine pulm toilet  and rehab  LOS: 1 day    GOLD,WAYNE E 03/03/2017  Today's chest x-ray reviewed-not signed out by radiology yet  No air leak on exam Chest tube placed on waterseal I have seen and examined Orson Slick and agree with the above assessment  and plan.  Grace Isaac MD [facility] [phone] Office [phone] 03/03/2017 11:26 AM

## 2017-03-04 ENCOUNTER — Inpatient Hospital Stay (HOSPITAL_COMMUNITY): Payer: Managed Care, Other (non HMO)

## 2017-03-04 LAB — CBC
HEMATOCRIT: 33.4 % — AB (ref 39.0–52.0)
Hemoglobin: 10.5 g/dL — ABNORMAL LOW (ref 13.0–17.0)
MCH: 26 pg (ref 26.0–34.0)
MCHC: 31.4 g/dL (ref 30.0–36.0)
MCV: 82.7 fL (ref 78.0–100.0)
PLATELETS: 97 10*3/uL — AB (ref 150–400)
RBC: 4.04 MIL/uL — ABNORMAL LOW (ref 4.22–5.81)
RDW: 18.5 % — AB (ref 11.5–15.5)
WBC: 17.7 10*3/uL — AB (ref 4.0–10.5)

## 2017-03-04 LAB — COMPREHENSIVE METABOLIC PANEL
ALT: 23 U/L (ref 17–63)
ANION GAP: 7 (ref 5–15)
AST: 17 U/L (ref 15–41)
Albumin: 3 g/dL — ABNORMAL LOW (ref 3.5–5.0)
Alkaline Phosphatase: 45 U/L (ref 38–126)
BUN: 6 mg/dL (ref 6–20)
CHLORIDE: 113 mmol/L — AB (ref 101–111)
CO2: 22 mmol/L (ref 22–32)
Calcium: 8.6 mg/dL — ABNORMAL LOW (ref 8.9–10.3)
Creatinine, Ser: 1.14 mg/dL (ref 0.61–1.24)
GFR calc Af Amer: >60 mL/min (ref >60)
GFR calc non Af Amer: >60 mL/min (ref >60)
Glucose, Bld: 100 mg/dL — ABNORMAL HIGH (ref 65–99)
POTASSIUM: 3.5 mmol/L (ref 3.5–5.1)
Sodium: 142 mmol/L (ref 135–145)
TOTAL PROTEIN: 5.7 g/dL — AB (ref 6.5–8.1)
Total Bilirubin: 0.6 mg/dL (ref 0.3–1.2)

## 2017-03-04 LAB — ACID FAST SMEAR (AFB, MYCOBACTERIA)

## 2017-03-04 LAB — ACID FAST SMEAR (AFB): ACID FAST SMEAR - AFSCU2: NEGATIVE

## 2017-03-04 MED ORDER — FAMOTIDINE 20 MG PO TABS: 20.0000 mg | ORAL_TABLET | Freq: Every day | ORAL | Status: DC

## 2017-03-04 NOTE — Progress Notes (Addendum)
[address]       [place] [address]             [phone]      2 Days Post-Op Procedure(s) (LRB): LEFT VIDEO ASSISTED THORACOSCOPY (VATS)/LEFT LOWER WEDGE AND LEFT UPPER LOBE RESECTION  (Left) LYMPH NODE BIOPSY (Left) Subjective:  feels sore in shoulder  Objective: Vital signs in last 24 hours: Temp:  [97.9 F (36.6 C)-98.7 F (37.1 C)] 98.4 F (36.9 C) (10/28 0739) Pulse Rate:  [59-79] 59 (10/28 0800) Cardiac Rhythm: Normal sinus rhythm (10/28 0700) Resp:  [16-21] 16 (10/28 0800) BP: (111-126)/(74-89) 125/89 (10/28 0739) SpO2:  [96 %-97 %] 96 % (10/28 0841)  Hemodynamic parameters for last 24 hours:    Intake/Output from previous day: 10/27 0701 - 10/28 0700 In: 2155 [P.O.:1080; I.V.:1025; IV Piggyback:50] Out: 1287 [Urine:4750; Chest Tube:110] Intake/Output this shift: Total I/O In: 240 [P.O.:240] Out: 850 [Urine:850]  General appearance: alert, cooperative and no distress Heart: regular rate and rhythm Lungs: clear to auscultation bilaterally Abdomen: benign Extremities: no edema or calf tenderness Wound: incis healing well  Lab Results:  Recent Labs  03/03/17 0543 03/04/17 0149  WBC 24.3* 17.7*  HGB 10.3* 10.5*  HCT 32.4* 33.4*  PLT 87* 97*   BMET:  Recent Labs  03/03/17 0543 03/04/17 0149  NA 137 142  K 3.3* 3.5  CL 107 113*  CO2 22 22  GLUCOSE 150* 100*  BUN 5* 6  CREATININE 0.96 1.14  CALCIUM 8.7* 8.6*    PT/INR: No results for input(s): LABPROT, INR in the last 72 hours. ABG    Component Value Date/Time   PHART 7.397 03/03/2017 0432   HCO3 22.2 03/03/2017 0432   TCO2 25 08/12/2008 1651   ACIDBASEDEF 2.0 03/03/2017 0432   O2SAT 97.0 03/03/2017 0432   CBG (last 3)  No results for input(s): GLUCAP in the last 72 hours.  Meds Scheduled Meds: . acetaminophen  1,000 mg Oral Q6H   Or  . acetaminophen (TYLENOL) oral liquid 160 mg/5 mL  1,000 mg Oral Q6H  . bisacodyl  10 mg Oral Daily  . enoxaparin  (LOVENOX) injection  40 mg Subcutaneous Q24H  . [START ON 03/05/2017] famotidine  20 mg Oral Daily  . fentaNYL   Intravenous Q4H  . fluticasone furoate-vilanterol  1 puff Inhalation Daily  . ketorolac  30 mg Intravenous Q6H  . nilotinib  300 mg Oral Q12H  . predniSONE  10 mg Oral Q breakfast  . senna-docusate  1 tablet Oral QHS  . sertraline  50 mg Oral Daily  . topiramate  50 mg Oral BID   Continuous Infusions: . dextrose 5 % and 0.9% NaCl 50 mL/hr at 03/03/17 1600  . potassium chloride     PRN Meds:.albuterol, diphenhydrAMINE **OR** diphenhydrAMINE, naloxone **AND** sodium chloride flush, ondansetron (ZOFRAN) IV, oxyCODONE, pneumococcal 23 valent vaccine, potassium chloride, traMADol  Xrays Dg Chest Port 1 View  Result Date: 03/03/2017 CLINICAL DATA:  Postop follow-up. EXAM: PORTABLE CHEST 1 VIEW COMPARISON:  Chest x-ray dated 03/02/2017. FINDINGS: Left-sided chest tube is stable in position with tip directed towards the left lung apex. Questionable tiny pneumothorax at the left lung apex. No visible pleural effusion. Heart size and mediastinal contours are stable. IMPRESSION: 1. Left-sided chest tube is stable in position. Questionable tiny left apical pneumothorax. 2. Lungs are clear. No evidence of pneumonia or pulmonary edema. No visible pleural effusion. Electronically Signed   By: Roxy Horseman.D.  On: 03/03/2017 11:25   Dg Chest Port 1 View  Result Date: 03/02/2017 CLINICAL DATA:  Left chest tube. EXAM: PORTABLE CHEST 1 VIEW COMPARISON:  02/28/2017. FINDINGS: Left chest tube noted. No pneumothorax. Postsurgical changes left lung. Mediastinum and hilar structures are normal. Heart size normal for portable technique. No pulmonary venous congestion. No pleural effusion. No acute bony abnormality . IMPRESSION: 1. Left chest tube noted. No pneumothorax. Postsurgical changes left lung. 2.  Mild basilar atelectasis. Electronically Signed   By: Marcello Moores  Register   On: 03/02/2017 14:46     Assessment/Plan: S/P Procedure(s) (LRB): LEFT VIDEO ASSISTED THORACOSCOPY (VATS)/LEFT LOWER WEDGE AND LEFT UPPER LOBE RESECTION  (Left) LYMPH NODE BIOPSY (Left)  1 doing well  2 no air leak- check CXR poss d/c tube 3 leukocytosis improved, labs stable 4 routine pulm toilet and rehab  LOS: 2 days    Nathan Kerr,Nathan Kerr 03/04/2017  Chest xray reviewed , d/c chest tube today  Fu chest xray in am I have seen and examined Nathan Kerr and agree with the above assessment  and plan.  Grace Isaac MD Beeper [phone] Office [phone] 03/04/2017 11:46 AM

## 2017-03-04 NOTE — Progress Notes (Signed)
PCA d/c 5pm. 15 mg fentanyl wasted from 25mg  PCA vial, in presence of [employer].

## 2017-03-05 ENCOUNTER — Inpatient Hospital Stay (HOSPITAL_COMMUNITY): Payer: Managed Care, Other (non HMO)

## 2017-03-05 MED ORDER — OXYCODONE HCL 5 MG PO TABS: ORAL_TABLET | ORAL | 0 refills | Status: DC

## 2017-03-05 MED ORDER — IBUPROFEN 200 MG PO TABS: 400.0000 mg | ORAL_TABLET | Freq: Three times a day (TID) | ORAL | 0 refills | Status: DC | PRN

## 2017-03-05 MED ORDER — PREDNISONE 10 MG PO TABS: 10.0000 mg | ORAL_TABLET | Freq: Every day | ORAL | Status: DC

## 2017-03-05 NOTE — Progress Notes (Signed)
3 Days Post-Op Procedure(s) (LRB): LEFT VIDEO ASSISTED THORACOSCOPY (VATS)/LEFT LOWER WEDGE AND LEFT UPPER LOBE RESECTION  (Left) LYMPH NODE BIOPSY (Left) Subjective: Some incisional pain Only took oxycodone once yesterday  Objective: Vital signs in last 24 hours: Temp:  [97.8 F (36.6 C)-98.4 F (36.9 C)] 98.2 F (36.8 C) (10/29 0747) Pulse Rate:  [59-95] 95 (10/29 0624) Cardiac Rhythm: Sinus tachycardia (10/29 0702) Resp:  [14-19] 14 (10/29 0747) BP: (121-131)/(76-97) 121/97 (10/29 0747) SpO2:  [96 %-100 %] 96 % (10/29 0747)  Hemodynamic parameters for last 24 hours:    Intake/Output from previous day: 10/28 0701 - 10/29 0700 In: 960 [P.O.:960] Out: 1950 [Urine:1950] Intake/Output this shift: No intake/output data recorded.  General appearance: alert, cooperative and no distress Neurologic: intact Heart: regular rate and rhythm Lungs: clear to auscultation bilaterally Wound: clean and dry  Lab Results:  Recent Labs  03/03/17 0543 03/04/17 0149  WBC 24.3* 17.7*  HGB 10.3* 10.5*  HCT 32.4* 33.4*  PLT 87* 97*   BMET:  Recent Labs  03/03/17 0543 03/04/17 0149  NA 137 142  K 3.3* 3.5  CL 107 113*  CO2 22 22  GLUCOSE 150* 100*  BUN 5* 6  CREATININE 0.96 1.14  CALCIUM 8.7* 8.6*    PT/INR: No results for input(s): LABPROT, INR in the last 72 hours. ABG    Component Value Date/Time   PHART 7.397 03/03/2017 0432   HCO3 22.2 03/03/2017 0432   TCO2 25 08/12/2008 1651   ACIDBASEDEF 2.0 03/03/2017 0432   O2SAT 97.0 03/03/2017 0432   CBG (last 3)  No results for input(s): GLUCAP in the last 72 hours.  Assessment/Plan: S/P Procedure(s) (LRB): LEFT VIDEO ASSISTED THORACOSCOPY (VATS)/LEFT LOWER WEDGE AND LEFT UPPER LOBE RESECTION  (Left) LYMPH NODE BIOPSY (Left) Plan for discharge: see discharge orders  Doing well POD # 3  Dc home today Path still pending   LOS: 3 days    Melrose Nakayama 03/05/2017

## 2017-03-05 NOTE — Progress Notes (Signed)
Removed On-Q without any issue. Gauze dressing applied to site. Patient resting in bed but has been up ambulating this morning. Patient also reports having a bowel movement this morning as well. Will continue to support and monitor.

## 2017-03-05 NOTE — Discharge Summary (Signed)
Physician Discharge Summary       [address]       [place] [address]             [phone]    Patient ID: Nathan Kerr MRN: [id number] DOB/AGE: 32-Jan-1987 32 y.o.  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 Allergy 2. Asthma 3. Chronic myeloid leukemia in remission (Rafael Hernandez) 4. Migraine  Procedure (s):  Left video-assisted thoracoscopy, wedge resection of lower lobe and upper lobe nodules, lymph node sampling, and On-Q local anesthetic catheter placement by Dr. Roxan Hockey on 03/02/2017.  Pathology: Results pending.  History of Presenting Illness: [name] is a 31 year old man with a history of chronic myeloid leukemia and childhood asthma. He is a lifelong nonsmoker. He also has a history of migraines. His CML is currently in remission. About 6 months ago he was having problems with headaches and nasal congestion. He then developed congestion and his chest. He had coughing and wheezing. One day in June he was very short of breath and went to an urgent care. A CT of the chest was done. It showed left lower lobe bronchial obstruction an air trapping.There was a circumferential soft tissue density. He saw pulmonary and had a bronchoscopy by Dr. Elsworth Soho. It showed extrinsic compression of the left lower lobe bronchus.sampling was nondiagnostic and cultures were negative. He was started on prednisone.  Rheumatologic workup was performed ANCA was negative and he had a weakly positive ANA.  His shortness of breath and wheezing improved but he continues to have bifrontal headaches. He's lost about 10-15 pounds over the past 3 months. He continues to have decreased energy and poor appetite. He also complains of blurry vision. An MR of the head showed chronic sinusitis changes.  Dr. Roxan Hockey had a long discussion with  patient and gave him multiple treatment options. The patient wanted to talk it over with his family before deciding which treatment option. He ultimately decided to undergo the left VATS. He was admitted on 03/02/2017 in order to undergo a left VATS, wedge resection of LLL and LUL, lymph node sampling, and On Q placement on 03/02/2017.   Brief Hospital Course:  The patient remained afebrile and hemodynamically stable. A line and foley were removed early in the post operative course. He had nausea post op but this did resovle rather quickly with Zofran. Chest tube output gradually decreased and there was no air leak. Chest tube was placed to water seal on 10/27. Daily chest x rays were obtained and remained stable. Chest tube was removed on 03/04/2017. Patient is ambulating on room air. Patient is tolerating a diet and has had a bowel movement. Wounds are clean and dry. Final chest x ray showed no definite pneumothorax on the left, stable right infrahilar subsegmental atelectasis. Dr. Roxan Hockey has seen and evaluated the patient. The patient is felt surgically stable for discharge today.  Latest Vital Signs: Blood pressure (!) 121/97, pulse 95, temperature 98.2 F (36.8 C), temperature source Oral, resp. rate 14, height 5\' 10"  (1.778 m), weight 202 lb 13.2 oz (92 kg), SpO2 96 %.  Physical Exam: General appearance: alert, cooperative and no distress Neurologic: intact Heart: regular rate and rhythm Lungs: clear to auscultation bilaterally Wound: clean and dry  Discharge Condition: Stable and discharged to home.  Recent laboratory studies:  Lab Results  Component Value Date   WBC 17.7 (  H) 03/04/2017   HGB 10.5 (L) 03/04/2017   HCT 33.4 (L) 03/04/2017   MCV 82.7 03/04/2017   PLT 97 (L) 03/04/2017   Lab Results  Component Value Date   NA 142 03/04/2017   K 3.5 03/04/2017   CL 113 (H) 03/04/2017   CO2 22 03/04/2017   CREATININE 1.14 03/04/2017   GLUCOSE 100 (H) 03/04/2017    Diagnostic Studies: Dg Chest 2 View  Result Date: 03/05/2017 CLINICAL DATA:  Status post video assisted thoracoscopy and left lower wedge and left upper resection 3 days ago. EXAM: CHEST  2 VIEW COMPARISON:  Portable chest x-ray of March 04, 2017 FINDINGS: The lungs are adequately inflated. There is a suture line visible in the left upper lobe. The left chest tube is been removed. No definite pneumothorax persists. There is subsegmental atelectasis in the right infrahilar region which is stable. The heart and pulmonary vascularity are normal. The mediastinum is normal in width. There is no pleural effusion. IMPRESSION: No definite pneumothorax on the left since chest tube removal. Stable right infrahilar subsegmental atelectasis or infiltrate. Electronically Signed   By: David  Martinique M.D.   On: 03/05/2017 07:46    Discharge Medications: Allergies as of 03/05/2017   No Known Allergies     Medication List    STOP taking these medications   acetaminophen-codeine 300-30 MG tablet Commonly known as:  TYLENOL #3   clindamycin 1 % lotion Commonly known as:  CLEOCIN T   oxyCODONE-acetaminophen 10-325 MG tablet Commonly known as:  PERCOCET     TAKE these medications   acetaminophen 325 MG tablet Commonly known as:  TYLENOL Take 650 mg by mouth every 6 (six) hours as needed for mild pain or headache.   fluticasone furoate-vilanterol 100-25 MCG/INH Aepb Commonly known as:  BREO ELLIPTA Inhale 1 puff into the lungs daily.   ibuprofen 200 MG tablet Commonly known as:  ADVIL,MOTRIN Take 2 tablets (400 mg total) by mouth every 8 (eight) hours as needed for headache or moderate pain. What changed:  when to take this   meloxicam 15 MG tablet Commonly known as:  MOBIC Take 1 tablet (15 mg total) by mouth daily. PATIENT NEEDS OFFICE VISIT FOR ADDITIONAL REFILLS What changed:  additional instructions   nilotinib 150 MG capsule Commonly known as:  TASIGNA Take 2 capsules (300 mg total)  by mouth every 12 (twelve) hours.   oxyCODONE 5 MG immediate release tablet Commonly known as:  Oxy IR/ROXICODONE Take 5 mg by mouth every 4-6 hours PRN severe pain.   predniSONE 10 MG tablet Commonly known as:  DELTASONE Take 1 tablet (10 mg total) by mouth daily with breakfast.   PROAIR HFA 108 (90 Base) MCG/ACT inhaler Generic drug:  albuterol Inhale 2 puffs into the lungs every 4 (four) hours as needed for wheezing.   rizatriptan 10 MG disintegrating tablet Commonly known as:  MAXALT-MLT Take 1 tablet (10 mg total) by mouth as needed for migraine. May repeat in 2 hours if needed   sertraline 50 MG tablet Commonly known as:  ZOLOFT Take 1 tablet (50 mg total) by mouth daily.   SUMAtriptan 100 MG tablet Commonly known as:  IMITREX Take 1 tablet at onset of headache. May repeat in 2 hrs if needed, MAX 2 tabs/24 hrs. What changed:  how much to take  how to take this  when to take this  reasons to take this  additional instructions   topiramate 50 MG tablet Commonly known as:  TOPAMAX  Take 1 tablet (50 mg total) by mouth 2 (two) times daily.       Follow Up Appointments: Follow-up Information    Melrose Nakayama, MD. Go on 03/20/2017.   Specialty:  Cardiothoracic Surgery Why:  PA/LAT CXR to be taken (at [facility] which is in the same building as Dr. Leonarda Salon office, on the ground floor) on 03/20/2017 at 12:00 pm;Appointment time is at 12:30 pm Contact information: [place] [id number] [phone]        Wyatt Portela, MD. Call.   Specialty:  Oncology Why:  for a follow up appointment  Contact information: [place] [address] [phone]           Signed: Lars Pinks MPA-C 03/05/2017, 9:17 AM

## 2017-03-05 NOTE — Discharge Instructions (Signed)
Thoracoscopy, Care After °Refer to this sheet in the next few weeks. These instructions provide you with information about caring for yourself after your procedure. Your health care provider may also give you more specific instructions. Your treatment has been planned according to current medical practices, but problems sometimes occur. Call your health care provider if you have any problems or questions after your procedure. °What can I expect after the procedure? °After your procedure, it is common to feel sore for up to two weeks. °Follow these instructions at home: °· There are many different ways to close and cover an incision, including stitches (sutures), skin glue, and adhesive strips. Follow your health care provider's instructions about: °? Incision care. °? Bandage (dressing) changes and removal. °? Incision closure removal. °· Check your incision area every day for signs of infection. Watch for: °? Redness, swelling, or pain. °? Fluid, blood, or pus. °· Take medicines only as directed by your health care provider. °· Try to cough often. Coughing helps to protect against lung infection (pneumonia). It may hurt to cough. If this happens, hold a pillow against your chest when you cough. °· Take deep breaths. This also helps to protect against pneumonia. °· If you were given an incentive spirometer, use it as directed by your health care provider. °· Do not take baths, swim, or use a hot tub until your health care provider approves. You may take showers. °· Avoid lifting until your health care provider approves. °· Avoid driving until your health care provider approves. °· Do not travel by airplane after the chest tube is removed until your health care provider approves. °Contact a health care provider if: °· You have a fever. °· Pain medicines do not ease your pain. °· You have redness, swelling, or increasing pain in your incision area. °· You develop a cough that does not go away, or you are coughing up  mucus that is yellow or green. °Get help right away if: °· You have fluid, blood, or pus coming from your incision. °· There is a bad smell coming from your incision or dressing. °· You develop a rash. °· You have difficulty breathing. °· You cough up blood. °· You develop light-headedness or you feel faint. °· You develop chest pain. °· Your heartbeat feels irregular or very fast. °This information is not intended to replace advice given to you by your health care provider. Make sure you discuss any questions you have with your health care provider. °Document Released: 11/11/2004 Document Revised: 12/26/2015 Document Reviewed: 01/07/2014 °Elsevier Interactive Patient Education © 2018 [employer] ° °

## 2017-03-05 NOTE — Progress Notes (Signed)
Events of the last few days noted.  He is status post wedge resection of a lower lobe pulmonary nodule completed on 03/02/2017.  Final pathology remains pending.  Previous biopsies has not been diagnostic.  He is recovering reasonably well from the procedure without any major complaints this morning.  Patient known with history of CML currently on to Tasigna 300 mg twice daily.  I have recommended continuing this medication with the same dose and schedule while hospitalized.  This was discussed with the patient personally today and he is in agreement.  I will continue to follow him periodically and assist in any way needed.

## 2017-03-05 NOTE — Progress Notes (Signed)
Discharge Note. PIVs removed without any issues. Discharge instructions reviewed with the patient. Reviewed all medications including new Rxs. Home medication that was stored in our pharmacy was returned to the patient. Went over all follow up appointments and how important making appointments are. Reviewed care of the surgical site. The case manager came in and checked to see if patient had any needs for home. Patient stated he has a ride home and will be able to get himself to follow up appointments and doesn't have a problem obtaining medications. Patient currently getting dressed and eating lunch. The volunteer is getting a wheelchair and will escort patient out. Patient ready for discharge.

## 2017-03-06 ENCOUNTER — Encounter: Payer: Self-pay | Admitting: Adult Health

## 2017-03-06 ENCOUNTER — Ambulatory Visit (INDEPENDENT_AMBULATORY_CARE_PROVIDER_SITE_OTHER): Payer: Managed Care, Other (non HMO) | Admitting: Adult Health

## 2017-03-06 DIAGNOSIS — J45901 Unspecified asthma with (acute) exacerbation: Secondary | ICD-10-CM | POA: Diagnosis not present

## 2017-03-06 DIAGNOSIS — R918 Other nonspecific abnormal finding of lung field: Secondary | ICD-10-CM | POA: Diagnosis not present

## 2017-03-06 NOTE — Assessment & Plan Note (Addendum)
S/p VATS 03/02/17 ,prelim -+ necrotic material , cultures pending . Nodes unremarkable.  Follow final path/cultures  . follow up with CTS as planned .  Taper off steroids slowly over next week weeks.

## 2017-03-06 NOTE — Progress Notes (Signed)
@Patient  ID: Nathan Kerr, male    DOB: 05-17-1985, 31 y.o.   MRN: [id number]  No chief complaint on file.   Referring provider: Golden Circle, FNP  HPI: 31 year old male with chronic myelocytic Leukemia in remission for FU of  left hilar mass and pulmonary nodules.  TEST /Events :  Bronchoscopy 10/2016 Showedextrinsic compression of left lower lobe bronchus&involving bronchial wall Bronchial brushings &biopsies negative, LLL appeared open &narrowing could be traversed with forceps. Cultures all negative including AFB and fungal  He had seen ENT 09/2016, head CT was negative , MRI is planned  allergist- Kozlow, ANA was +1:160, repeated  CT chest 12/2016 left hilar mass decreased slightly 2.1 cm, right middle lobe 8 mm nodule is unchanged new nodule in right lower lobe and left lower lobe measuring 8 mm  10/2016 CT chest>>in the left lower lobe there is airway narrowing which appears to be due to extrinsic compression from a soft tissue density. There is some air-trapping in the left lower lobe.  ANA 1: 160 + ANCA neg  03/06/2017 Follow up: Lung Mass /VATS  Pt returns follow up . Pt underwent VATS on 03/02/17 for lung nodule and hilar adenopathy.  Pt underwent bronchoscopy in June that was non diagnositic and cultures neg.  ANCA was neg. Weakly positive ANA. He was started on prednisone , currently on prednisone 10mg . He underwent left VATS with wedge resection of LLL and lymph node sampling.  Final Path is pending . , preliminary showed necrotic materials. Cultures pending . Nodes were unremarkable.  He says he is doing okay . Very sore and slow moving . Denies dyspnea or cough .  No hemoptysis .       No Known Allergies  There is no immunization history for the selected administration types on file for this patient.  Past Medical History:  Diagnosis Date  . Allergy   . Asthma    Childhood, not currently active  . Chronic myeloid leukemia in remission  ([place])   . CML (chronic myelocytic leukemia) ([place])   . Depression   . Leukemia ([place]) 2010  . Migraine     Tobacco History: History  Smoking Status  . Never Smoker  Smokeless Tobacco  . Never Used    Comment: occas cigar   Counseling given: Not Answered   Outpatient Encounter Prescriptions as of 03/06/2017  Medication Sig  . acetaminophen (TYLENOL) 325 MG tablet Take 650 mg by mouth every 6 (six) hours as needed for mild pain or headache.   . fluticasone furoate-vilanterol (BREO ELLIPTA) 100-25 MCG/INH AEPB Inhale 1 puff into the lungs daily.  [name] ibuprofen (ADVIL,MOTRIN) 200 MG tablet Take 2 tablets (400 mg total) by mouth every 8 (eight) hours as needed for headache or moderate pain.  . meloxicam (MOBIC) 15 MG tablet Take 1 tablet (15 mg total) by mouth daily. PATIENT NEEDS OFFICE VISIT FOR ADDITIONAL REFILLS (Patient taking differently: Take 15 mg by mouth daily. )  . nilotinib (TASIGNA) 150 MG capsule Take 2 capsules (300 mg total) by mouth every 12 (twelve) hours.  [name] oxyCODONE (OXY IR/ROXICODONE) 5 MG immediate release tablet Take 5 mg by mouth every 4-6 hours PRN severe pain.  . predniSONE (DELTASONE) 10 MG tablet Take 1 tablet (10 mg total) by mouth daily with breakfast.  . PROAIR HFA 108 (90 Base) MCG/ACT inhaler Inhale 2 puffs into the lungs every 4 (four) hours as needed for wheezing.  . rizatriptan (MAXALT-MLT) 10 MG disintegrating tablet Take 1 tablet (10  mg total) by mouth as needed for migraine. May repeat in 2 hours if needed  . sertraline (ZOLOFT) 50 MG tablet Take 1 tablet (50 mg total) by mouth daily.  . SUMAtriptan (IMITREX) 100 MG tablet Take 1 tablet at onset of headache. May repeat in 2 hrs if needed, MAX 2 tabs/24 hrs. (Patient taking differently: Take 100 mg by mouth every 2 (two) hours as needed for migraine or headache. MAX 2 tabs/24 hrs.)  . topiramate (TOPAMAX) 50 MG tablet Take 1 tablet (50 mg total) by mouth 2 (two) times daily.   Facility-Administered  Encounter Medications as of 03/06/2017  Medication  . ondansetron (ZOFRAN-ODT) disintegrating tablet 8 mg     Review of Systems  Constitutional:   No  weight loss, night sweats,  Fevers, chills, + fatigue, or  lassitude.  HEENT:   No headaches,  Difficulty swallowing,  Tooth/dental problems, or  Sore throat,                No sneezing, itching, ear ache, nasal congestion, post nasal drip,   CV:  No chest pain,  Orthopnea, PND, swelling in lower extremities, anasarca, dizziness, palpitations, syncope.   GI  No heartburn, indigestion, abdominal pain, nausea, vomiting, diarrhea, change in bowel habits, loss of appetite, bloody stools.   Resp: No shortness of breath with exertion or at rest.  No excess mucus, no productive cough,  No non-productive cough,  No coughing up of blood.  No change in color of mucus.  No wheezing.  No chest wall deformity  Skin: no rash or lesions.  GU: no dysuria, change in color of urine, no urgency or frequency.  No flank pain, no hematuria   MS:  No joint pain or swelling.  No decreased range of motion.  No back pain.    Physical Exam  BP 122/70 (BP Location: Right Wrist, Cuff Size: Normal)   Pulse (!) 103   Ht 5' 10.5" (1.791 m)   Wt 203 lb 3.2 oz (92.2 kg)   SpO2 95%   BMI 28.74 kg/m   GEN: A/Ox3; pleasant , NAD    HEENT:  [place]/AT,  EACs-clear, TMs-wnl, NOSE-clear, THROAT-clear, no lesions, no postnasal drip or exudate noted.   NECK:  Supple w/ fair ROM; no JVD; normal carotid impulses w/o bruits; no thyromegaly or nodules palpated; no lymphadenopathy.    RESP  Clear  P & A; w/o, wheezes/ rales/ or rhonchi. no accessory muscle use, no dullness to percussion  CARD:  RRR, no m/r/g, no peripheral edema, pulses intact, no cyanosis or clubbing.  GI:   Soft & nt; nml bowel sounds; no organomegaly or masses detected.   Musco: Warm bil, no deformities or joint swelling noted.   Neuro: alert, no focal deficits noted.    Skin: Warm, no lesions or  rashes, left lateral chest wall with well approximated incision , clean and dry.    Lab Results:   BNP No results found for: BNP  ProBNP No results found for: PROBNP  Imaging: Dg Chest 2 View  Result Date: 03/05/2017 CLINICAL DATA:  Status post video assisted thoracoscopy and left lower wedge and left upper resection 3 days ago. EXAM: CHEST  2 VIEW COMPARISON:  Portable chest x-ray of March 04, 2017 FINDINGS: The lungs are adequately inflated. There is a suture line visible in the left upper lobe. The left chest tube is been removed. No definite pneumothorax persists. There is subsegmental atelectasis in the right infrahilar region which is stable. The  heart and pulmonary vascularity are normal. The mediastinum is normal in width. There is no pleural effusion. IMPRESSION: No definite pneumothorax on the left since chest tube removal. Stable right infrahilar subsegmental atelectasis or infiltrate. Electronically Signed   By: David  Martinique M.D.   On: 03/05/2017 07:46   Dg Chest 2 View  Result Date: 02/28/2017 CLINICAL DATA:  Preoperative examination prior to lung biopsy. Patient with pulmonary nodules. EXAM: CHEST  2 VIEW COMPARISON:  12/27/2016 CT and 10/23/2016 chest radiograph FINDINGS: The cardiomediastinal silhouette is unremarkable. The known pulmonary nodules are difficult to visualize on this radiograph. There is no evidence of focal airspace disease, pulmonary edema, suspicious pulmonary nodule/mass, pleural effusion, or pneumothorax. No acute bony abnormalities are identified. IMPRESSION: No evidence of acute cardiopulmonary disease. The patient's known pulmonary nodules are difficult to visualize on this exam. Electronically Signed   By: Margarette Canada M.D.   On: 02/28/2017 16:35   Dg Chest Port 1 View  Result Date: 03/04/2017 CLINICAL DATA:  Left video-assisted thoracoscopy/left lower wedge and left upper lobe resection. EXAM: PORTABLE CHEST 1 VIEW COMPARISON:  March 03, 2017  FINDINGS: The left chest tube is stable. No convincing pneumothorax identified. Worsening focal opacity in the medial right lung base. No other interval change. IMPRESSION: 1. The left chest tube is stable.  No pneumothorax identified. 2. Atelectasis or infiltrate in the medial right lung base. Recommend follow-up to resolution. Electronically Signed   By: Dorise Bullion III M.D   On: 03/04/2017 14:13   Dg Chest Port 1 View  Result Date: 03/03/2017 CLINICAL DATA:  Postop follow-up. EXAM: PORTABLE CHEST 1 VIEW COMPARISON:  Chest x-ray dated 03/02/2017. FINDINGS: Left-sided chest tube is stable in position with tip directed towards the left lung apex. Questionable tiny pneumothorax at the left lung apex. No visible pleural effusion. Heart size and mediastinal contours are stable. IMPRESSION: 1. Left-sided chest tube is stable in position. Questionable tiny left apical pneumothorax. 2. Lungs are clear. No evidence of pneumonia or pulmonary edema. No visible pleural effusion. Electronically Signed   By: Franki Cabot M.D.   On: 03/03/2017 11:25   Dg Chest Port 1 View  Result Date: 03/02/2017 CLINICAL DATA:  Left chest tube. EXAM: PORTABLE CHEST 1 VIEW COMPARISON:  02/28/2017. FINDINGS: Left chest tube noted. No pneumothorax. Postsurgical changes left lung. Mediastinum and hilar structures are normal. Heart size normal for portable technique. No pulmonary venous congestion. No pleural effusion. No acute bony abnormality . IMPRESSION: 1. Left chest tube noted. No pneumothorax. Postsurgical changes left lung. 2.  Mild basilar atelectasis. Electronically Signed   By: Marcello Moores  Register   On: 03/02/2017 14:46     Assessment & Plan:   Lung nodules S/p VATS 03/02/17 ,prelim -+ necrotic material , cultures pending . Nodes unremarkable.  Follow final path/cultures  . follow up with CTS as planned .  Taper off steroids slowly over next week weeks.    Asthma, chronic, unspecified asthma severity, with acute  exacerbation Stable without flare  Cont on BREO  Taper off prednisone .      Rexene Edison, NP 03/06/2017

## 2017-03-06 NOTE — Patient Instructions (Signed)
Taper prednisone to off as directed over next few weeks.  Follow up with Dr. Elsworth Soho  In 6 weeks and As needed

## 2017-03-06 NOTE — Assessment & Plan Note (Signed)
Stable without flare  Cont on BREO  Taper off prednisone .

## 2017-03-07 LAB — AEROBIC/ANAEROBIC CULTURE (SURGICAL/DEEP WOUND): CULTURE: NO GROWTH

## 2017-03-07 LAB — AEROBIC/ANAEROBIC CULTURE W GRAM STAIN (SURGICAL/DEEP WOUND)

## 2017-03-08 LAB — BCR-ABL1, CML/ALL, PCR, QUANT: B2A2 TRANSCRIPT: 99.7392 %

## 2017-03-11 ENCOUNTER — Other Ambulatory Visit: Payer: Self-pay | Admitting: Pulmonary Disease

## 2017-03-12 ENCOUNTER — Encounter: Payer: Self-pay | Admitting: Thoracic Surgery (Cardiothoracic Vascular Surgery)

## 2017-03-12 NOTE — Progress Notes (Signed)
Called [name] to provide path results.  Necrotizing granuloma c/w infectious process. Lymph nodes benign  AFB and fungal cultures negative  Remo Lipps C. Roxan Hockey, MD [facility] [phone]

## 2017-03-15 ENCOUNTER — Other Ambulatory Visit: Payer: BLUE CROSS/BLUE SHIELD

## 2017-03-15 ENCOUNTER — Ambulatory Visit: Payer: BLUE CROSS/BLUE SHIELD | Admitting: Oncology

## 2017-03-20 ENCOUNTER — Ambulatory Visit: Payer: Managed Care, Other (non HMO) | Admitting: Thoracic Surgery (Cardiothoracic Vascular Surgery)

## 2017-03-26 ENCOUNTER — Other Ambulatory Visit: Payer: Self-pay | Admitting: Thoracic Surgery (Cardiothoracic Vascular Surgery)

## 2017-03-26 DIAGNOSIS — R918 Other nonspecific abnormal finding of lung field: Secondary | ICD-10-CM

## 2017-03-27 ENCOUNTER — Ambulatory Visit (INDEPENDENT_AMBULATORY_CARE_PROVIDER_SITE_OTHER): Payer: Self-pay | Admitting: Thoracic Surgery (Cardiothoracic Vascular Surgery)

## 2017-03-27 ENCOUNTER — Other Ambulatory Visit: Payer: Self-pay

## 2017-03-27 ENCOUNTER — Ambulatory Visit
Admission: RE | Admit: 2017-03-27 | Discharge: 2017-03-27 | Disposition: A | Discharge status: Home / Self Care | Payer: Managed Care, Other (non HMO) | Source: Ambulatory Visit | Attending: Thoracic Surgery (Cardiothoracic Vascular Surgery) | Admitting: Thoracic Surgery (Cardiothoracic Vascular Surgery)

## 2017-03-27 ENCOUNTER — Encounter: Payer: Self-pay | Admitting: Thoracic Surgery (Cardiothoracic Vascular Surgery)

## 2017-03-27 VITALS — BP 116/78 | HR 87 | Ht 70.0 in | Wt 195.0 lb

## 2017-03-27 DIAGNOSIS — R918 Other nonspecific abnormal finding of lung field: Secondary | ICD-10-CM

## 2017-03-27 DIAGNOSIS — J8410 Pulmonary fibrosis, unspecified: Secondary | ICD-10-CM

## 2017-03-27 NOTE — Progress Notes (Signed)
[address]       [place] [address]             [phone]    HPI: Nathan Kerr returns for scheduled follow-up visit  He is a 31 year old man with a history of chronic myeloid leukemia in childhood asthma.  His CML is currently in remission.  Over the summer he developed congestion and cough and became very short of breath.  A CT of the chest showed left lower lobe bronchial obstruction and air trapping.  He saw Dr. Eartha Kerr and had a bronchoscopy which demonstrated extrinsic compression of the left lower lobe bronchus.  A follow-up CT showed some improvement in the soft tissue density around the lower lobe bronchus, but there were new pulmonary nodules.  I did a left VATS with wedge resection of lower and upper lobe nodules and lymph node sampling from around the lower lobe bronchus.  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  There was a fibrotic inflammatory reaction around the lymph nodes but the nodes themselves were relatively normal in appearance.  There was no malignancy.  He did well postoperatively and went home on day 3.  He continues to do well.  He does have some incisional discomfort.  He did not see much benefit from oxycodone.  But ibuprofen seems to help more.  He says that he feels like his breath catches when he gets to about 85-90% of a deep breath.  Past Medical History:  Diagnosis Date  . Allergy   . Asthma    Childhood, not currently active  . Chronic myeloid leukemia in remission (Nathan Kerr)   . CML (chronic myelocytic leukemia) (Nathan Kerr)   . Depression   . Leukemia (Nathan Kerr) 2010  . Migraine     Current Outpatient Medications  Medication Sig Dispense Refill  . acetaminophen (TYLENOL) 325 MG tablet Take 650 mg by mouth every 6 (six) hours as needed for mild pain or headache.     . fluticasone furoate-vilanterol (BREO ELLIPTA) 100-25 MCG/INH AEPB Inhale 1 puff into the lungs daily. 1 each 3  . ibuprofen (ADVIL,MOTRIN) 200 MG tablet Take 2  tablets (400 mg total) by mouth every 8 (eight) hours as needed for headache or moderate pain. 30 tablet 0  . meloxicam (MOBIC) 15 MG tablet Take 1 tablet (15 mg total) by mouth daily. PATIENT NEEDS OFFICE VISIT FOR ADDITIONAL REFILLS (Patient taking differently: Take 15 mg by mouth daily. ) 30 tablet 3  . nilotinib (TASIGNA) 150 MG capsule Take 2 capsules (300 mg total) by mouth every 12 (twelve) hours. 360 capsule 0  . oxyCODONE (OXY IR/ROXICODONE) 5 MG immediate release tablet Take 5 mg by mouth every 4-6 hours PRN severe pain. 30 tablet 0  . predniSONE (DELTASONE) 10 MG tablet Take 1 tablet (10 mg total) by mouth daily with breakfast.    . PROAIR HFA 108 (90 Base) MCG/ACT inhaler Inhale 2 puffs into the lungs every 4 (four) hours as needed for wheezing. 1 Inhaler 1  . rizatriptan (MAXALT-MLT) 10 MG disintegrating tablet Take 1 tablet (10 mg total) by mouth as needed for migraine. May repeat in 2 hours if needed 9 tablet 11  . sertraline (ZOLOFT) 50 MG tablet Take 1 tablet (50 mg total) by mouth daily. 30 tablet 1  . SUMAtriptan (IMITREX) 100 MG tablet Take 1 tablet at onset of headache. May repeat in 2 hrs if needed, MAX 2 tabs/24 hrs. (Patient taking differently: Take 100 mg  by mouth every 2 (two) hours as needed for migraine or headache. MAX 2 tabs/24 hrs.) 9 tablet 5  . topiramate (TOPAMAX) 50 MG tablet Take 1 tablet (50 mg total) by mouth 2 (two) times daily. 60 tablet 12   Current Facility-Administered Medications  Medication Dose Route Frequency Provider Last Rate Last Dose  . ondansetron (ZOFRAN-ODT) disintegrating tablet 8 mg  8 mg Oral Once Nathan Koyanagi, MD        Physical Exam BP 116/78   Pulse 87   Ht 5\' 10"  (1.778 m)   Wt 195 lb (88.5 kg)   SpO2 98%   BMI 27.98 kg/m  Well-appearing 31 year old man in no acute distress Alert and oriented x3 with no focal deficits Lungs clear with equal breath sounds bilaterally Incision and chest tube site well-healed  Diagnostic  Tests: CHEST  2 VIEW  COMPARISON:  03/05/2017.  FINDINGS: Mediastinum and hilar structures are normal. Lungs are clear. No pleural effusion or pneumothorax. Heart size normal. Thoracic spine degenerative changes scoliosis.  IMPRESSION: No acute cardiopulmonary disease.   Electronically Signed   By: Nathan Kerr  Register   On: 03/27/2017 10:06  Impression: Nathan Kerr is a 31 year old gentleman with a history of CML and asthma who underwent a left VATS for wedge resection of pulmonary nodules and lymph node biopsy.  There is no evidence of malignancy.  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  AFB and fungal stains were negative and cultures have not grown out anything to date.  From a surgical standpoint he is doing well.  He went back to work within a week after the operation.  He should continue to improve with time.  He will continue to follow-up with Dr. Lindi Kerr and pulmonary regarding his remaining lung nodules.  Plan:  I will be happy to see Nathan Kerr back at any time in the future if I can be of any further assistance with his care  Nathan Nakayama, MD [facility] [phone]

## 2017-04-02 ENCOUNTER — Encounter: Payer: Self-pay | Admitting: Diagnostic Neuroimaging

## 2017-04-02 ENCOUNTER — Ambulatory Visit (INDEPENDENT_AMBULATORY_CARE_PROVIDER_SITE_OTHER): Payer: Managed Care, Other (non HMO) | Admitting: Diagnostic Neuroimaging

## 2017-04-02 VITALS — BP 129/81 | HR 72 | Ht 70.0 in | Wt 202.0 lb

## 2017-04-02 DIAGNOSIS — G43009 Migraine without aura, not intractable, without status migrainosus: Secondary | ICD-10-CM | POA: Diagnosis not present

## 2017-04-02 MED ORDER — TOPIRAMATE 50 MG PO TABS: 50.0000 mg | ORAL_TABLET | Freq: Two times a day (BID) | ORAL | 12 refills | Status: DC

## 2017-04-02 NOTE — Patient Instructions (Signed)
-   continue topiramate 50mg  twice a day; may taper off in future (50mg  at bedtime x 2 weeks then stop)

## 2017-04-02 NOTE — Progress Notes (Signed)
[facility]  PATIENT: Nathan Kerr DOB: January 05, 1986  REFERRING CLINICIAN: Toniann Ket, FNP HISTORY FROM: patient  REASON FOR VISIT: follow up    HISTORICAL  CHIEF COMPLAINT:  Chief Complaint  Patient presents with  . Follow-up  . Headache    doing better    HISTORY OF PRESENT ILLNESS:   UPDATE (04/02/17, VRP): Since last visit, doing well. Tolerating topiramate 50mg  twice a day. No alleviating or aggravating factors. HA resolved by mid Oct 2018. Some "sinus" pressure HA in last 1 week, daily, few hrs, but not as severe as prior migraine. Also reports significant stresses in 2018 that contributed to HA --> losing job, wife leaving, recent thorascopy. Now in therapy and on zoloft, and feeling better.   PRIOR HPI (01/01/17): 31 year old male here for evaluation of headaches. Patient has history of CML in remission, allergies, asthma. 6 months ago patient was having nosebleeds, went to ENT, had cauterization procedure. There is a time he has been having increasing headaches. Now patient having almost daily headaches. No other specific triggering factors. He has ear popping sensation and other symptoms. Headaches significantly affecting him. Patient takes some medication for this at least 4 times per week. Age 73 years old patient had diagnosis of migraine headache. These improved over time. However they worsened in 2014, when he was averaging 10-20 headaches per year. He has some photophobia. No phonophobia. Some nausea. He does have motion sensitivity.    REVIEW OF SYSTEMS: Full 14 system review of systems performed and negative with exception of: headache depression restless legs depression.   ALLERGIES: No Known Allergies  HOME MEDICATIONS: Outpatient Medications Prior to Visit  Medication Sig Dispense Refill  . acetaminophen (TYLENOL) 325 MG tablet Take 650 mg by mouth every 6 (six) hours as needed for mild pain or headache.     . fluticasone furoate-vilanterol  (BREO ELLIPTA) 100-25 MCG/INH AEPB Inhale 1 puff into the lungs daily. 1 each 3  . ibuprofen (ADVIL,MOTRIN) 200 MG tablet Take 2 tablets (400 mg total) by mouth every 8 (eight) hours as needed for headache or moderate pain. 30 tablet 0  . meloxicam (MOBIC) 15 MG tablet Take 1 tablet (15 mg total) by mouth daily. PATIENT NEEDS OFFICE VISIT FOR ADDITIONAL REFILLS (Patient taking differently: Take 15 mg by mouth daily. ) 30 tablet 3  . nilotinib (TASIGNA) 150 MG capsule Take 2 capsules (300 mg total) by mouth every 12 (twelve) hours. 360 capsule 0  . oxyCODONE (OXY IR/ROXICODONE) 5 MG immediate release tablet Take 5 mg by mouth every 4-6 hours PRN severe pain. 30 tablet 0  . PROAIR HFA 108 (90 Base) MCG/ACT inhaler Inhale 2 puffs into the lungs every 4 (four) hours as needed for wheezing. 1 Inhaler 1  . rizatriptan (MAXALT-MLT) 10 MG disintegrating tablet Take 1 tablet (10 mg total) by mouth as needed for migraine. May repeat in 2 hours if needed 9 tablet 11  . sertraline (ZOLOFT) 50 MG tablet Take 1 tablet (50 mg total) by mouth daily. 30 tablet 1  . SUMAtriptan (IMITREX) 100 MG tablet Take 1 tablet at onset of headache. May repeat in 2 hrs if needed, MAX 2 tabs/24 hrs. (Patient taking differently: Take 100 mg by mouth every 2 (two) hours as needed for migraine or headache. MAX 2 tabs/24 hrs.) 9 tablet 5  . topiramate (TOPAMAX) 50 MG tablet Take 1 tablet (50 mg total) by mouth 2 (two) times daily. 60 tablet 12  . predniSONE (DELTASONE) 10 MG tablet  Take 1 tablet (10 mg total) by mouth daily with breakfast. (Patient not taking: Reported on 04/02/2017)     Facility-Administered Medications Prior to Visit  Medication Dose Route Frequency Provider Last Rate Last Dose  . ondansetron (ZOFRAN-ODT) disintegrating tablet 8 mg  8 mg Oral Once Leandrew Koyanagi, MD        PAST MEDICAL HISTORY: Past Medical History:  Diagnosis Date  . Allergy   . Asthma    Childhood, not currently active  . Chronic  myeloid leukemia in remission ([place])   . CML (chronic myelocytic leukemia) ([place])   . Depression   . Leukemia ([place]) 2010  . Migraine     PAST SURGICAL HISTORY: Past Surgical History:  Procedure Laterality Date  . APPENDECTOMY    . BRONCHOSCOPY    . FINGER SURGERY Left    index finger  . LYMPH NODE BIOPSY Left 03/02/2017   Procedure: LYMPH NODE BIOPSY;  Surgeon: Melrose Nakayama, MD;  Location: [place];  Service: Thoracic;  Laterality: Left;  [name] VIDEO ASSISTED THORACOSCOPY (VATS)/WEDGE RESECTION Left 03/02/2017   Procedure: LEFT VIDEO ASSISTED THORACOSCOPY (VATS)/LEFT LOWER WEDGE AND LEFT UPPER LOBE RESECTION ;  Surgeon: Melrose Nakayama, MD;  Location: [facility];  Service: Thoracic;  Laterality: Left;  [name] VIDEO BRONCHOSCOPY Bilateral 10/23/2016   Procedure: VIDEO BRONCHOSCOPY WITHOUT FLUORO;  Surgeon: Rigoberto Noel, MD;  Location: [facility];  Service: Cardiopulmonary;  Laterality: Bilateral;    FAMILY HISTORY: Family History  Problem Relation Age of Onset  . Sleep apnea Mother   . Migraines Father     SOCIAL HISTORY:  Social History   Socioeconomic History  . Marital status: Married    Spouse name: Not on file  . Number of children: 0  . Years of education: 40  . Highest education level: Not on file  Social Needs  . Financial resource strain: Not on file  . Food insecurity - worry: Not on file  . Food insecurity - inability: Not on file  . Transportation needs - medical: Not on file  . Transportation needs - non-medical: Not on file  Occupational History    Comment: sales for [employer]  Tobacco Use  . Smoking status: Never Smoker  . Smokeless tobacco: Never Used  . Tobacco comment: occas cigar  Substance and Sexual Activity  . Alcohol use: No    Comment: not currently while on SSRI's  . Drug use: No  . Sexual activity: Yes    Partners: Female  Other Topics Concern  . Not on file  Social History Narrative   Lives with wwife   Fun/Hobby: Video games,  woodworking, gardening   Caffeine- coffee, 1-2 cups daily, occas soda and tea     PHYSICAL EXAM  GENERAL EXAM/CONSTITUTIONAL: Vitals:  Vitals:   04/02/17 0901  BP: 129/81  Pulse: 72  Weight: 202 lb (91.6 kg)  Height: 5\' 10"  (1.778 m)   Wt Readings from Last 3 Encounters:  04/02/17 202 lb (91.6 kg)  03/27/17 195 lb (88.5 kg)  03/06/17 203 lb 3.2 oz (92.2 kg)     Body mass index is 28.98 kg/m. No exam data present  Patient is in no distress; well developed, nourished and groomed; neck is supple  CARDIOVASCULAR:  Examination of carotid arteries is normal; no carotid bruits  Regular rate and rhythm, no murmurs  Examination of peripheral vascular system by observation and palpation is normal  EYES:  Ophthalmoscopic exam of optic discs and posterior segments is normal; no  papilledema or hemorrhages  MUSCULOSKELETAL:  Gait, strength, tone, movements noted in Neurologic exam below  NEUROLOGIC: MENTAL STATUS:  No flowsheet data found.  awake, alert, oriented to person, place and time  recent and remote memory intact  normal attention and concentration  language fluent, comprehension intact, naming intact,   fund of knowledge appropriate  CRANIAL NERVE:   2nd - no papilledema on fundoscopic exam; SLIGHTLY PHOTOSENSITIVE  2nd, 3rd, 4th, 6th - pupils equal and reactive to light, visual fields full to confrontation, extraocular muscles intact, no nystagmus  5th - facial sensation symmetric  7th - facial strength symmetric  8th - hearing intact  9th - palate elevates symmetrically, uvula midline  11th - shoulder shrug symmetric  12th - tongue protrusion midline  MOTOR:   normal bulk and tone, full strength in the BUE, BLE  SENSORY:   normal and symmetric to light touch, temperature, vibration  COORDINATION:   finger-nose-finger, fine finger movements normal  REFLEXES:   deep tendon reflexes present and symmetric  GAIT/STATION:   narrow  based gait    DIAGNOSTIC DATA (LABS, IMAGING, TESTING) - I reviewed patient records, labs, notes, testing and imaging myself where available.  Lab Results  Component Value Date   WBC 17.7 (H) 03/04/2017   HGB 10.5 (L) 03/04/2017   HCT 33.4 (L) 03/04/2017   MCV 82.7 03/04/2017   PLT 97 (L) 03/04/2017      Component Value Date/Time   NA 142 03/04/2017 0149   NA 139 09/13/2016 0935   K 3.5 03/04/2017 0149   K 4.0 09/13/2016 0935   CL 113 (H) 03/04/2017 0149   CO2 22 03/04/2017 0149   CO2 23 09/13/2016 0935   GLUCOSE 100 (H) 03/04/2017 0149   GLUCOSE 95 09/13/2016 0935   BUN 6 03/04/2017 0149   BUN 16.7 09/13/2016 0935   CREATININE 1.14 03/04/2017 0149   CREATININE 1.0 09/13/2016 0935   CALCIUM 8.6 (L) 03/04/2017 0149   CALCIUM 9.7 09/13/2016 0935   PROT 5.7 (L) 03/04/2017 0149   PROT 7.9 09/13/2016 0935   ALBUMIN 3.0 (L) 03/04/2017 0149   ALBUMIN 3.8 09/13/2016 0935   AST 17 03/04/2017 0149   AST 13 09/13/2016 0935   ALT 23 03/04/2017 0149   ALT 64 (H) 09/13/2016 0935   ALKPHOS 45 03/04/2017 0149   ALKPHOS 103 09/13/2016 0935   BILITOT 0.6 03/04/2017 0149   BILITOT 0.72 09/13/2016 0935   GFRNONAA >60 03/04/2017 0149   GFRAA >60 03/04/2017 0149   No results found for: CHOL, HDL, LDLCALC, LDLDIRECT, TRIG, CHOLHDL No results found for: HGBA1C No results found for: VITAMINB12 Lab Results  Component Value Date   TSH 3.290 07/05/2016    03/16/13 CT head [I reviewed images myself and agree with interpretation. -VRP]  - No acute intracranial pathology.  12/27/16 CT chest  1. Left perihilar mass narrowing the left lower lobe bronchus is slightly decreased in size in the interval. There is continued narrowing of the left lower lobe bronchus. 2. Bilateral pulmonary nodules are identified and progressed from previous exam.  01/14/17 MRI of the brain without contrast shows the following: 1.    Brain parenchyma is normal for age with a single chronic punctate T2/FLAIR  hyperintense focus in the left frontal lobe. 2.    Mild chronic maxillary and sphenoid sinusitis 3.    There are no acute findings.    ASSESSMENT AND PLAN  31 y.o. year old male here with history of CML, allergy, asthma, here  for evaluation of increasing headaches. Patient does have longer history of migraine headaches, but now having change in headache frequency and intensity. Now better on topiramate.   Dx: migraine without aura  No diagnosis found.   PLAN:  I spent 15 minutes of face to face time with patient. Greater than 50% of time was spent in counseling and coordination of care with patient. In summary we discussed:   - continue topiramate 50mg  twice a day  - continue rizatriptan as needed  Meds ordered this encounter  Medications  . topiramate (TOPAMAX) 50 MG tablet    Sig: Take 1 tablet (50 mg total) by mouth 2 (two) times daily.    Dispense:  60 tablet    Refill:  12   Return in about 6 months (around 09/30/2017).    Penni Bombard, MD 33/83/2919, 1:66 AM Certified in Neurology, Neurophysiology and Neuroimaging  [facility] [address], [place],  [address] [phone]

## 2017-04-03 LAB — FUNGUS CULTURE WITH STAIN

## 2017-04-03 LAB — FUNGAL ORGANISM REFLEX

## 2017-04-03 LAB — FUNGUS CULTURE RESULT

## 2017-04-13 ENCOUNTER — Ambulatory Visit (INDEPENDENT_AMBULATORY_CARE_PROVIDER_SITE_OTHER): Payer: Self-pay | Admitting: Physician Assistant

## 2017-04-13 ENCOUNTER — Other Ambulatory Visit: Payer: Self-pay

## 2017-04-13 VITALS — BP 121/87 | HR 90 | Resp 18 | Ht 70.0 in | Wt 190.0 lb

## 2017-04-13 DIAGNOSIS — Z9889 Other specified postprocedural states: Secondary | ICD-10-CM

## 2017-04-13 NOTE — Patient Instructions (Signed)
Please call our office if you notice increased redness around the area or increased drainage and we would be happy to do another wound check and prescribe antibiotics if indicated.

## 2017-04-13 NOTE — Progress Notes (Signed)
Ramon Zanders is a 31 y.o. male patient status post left VATS with a wedge resection of the lower and upper lobe nodules and lymph node sampling with Dr. Roxan Hockey on March 02, 2017.  He presents today to the office with a stitch poking out of the skin and a small amount of localized redness.  He wanted the wound inspected.   No diagnosis found. Past Medical History:  Diagnosis Date  . Allergy   . Asthma    Childhood, not currently active  . Chronic myeloid leukemia in remission ([place])   . CML (chronic myelocytic leukemia) ([place])   . Depression   . Leukemia ([place]) 2010  . Migraine    No past surgical history pertinent negatives on file.   Scheduled Meds: . ondansetron  8 mg Oral Once   Continuous Infusions: PRN Meds: Current Outpatient Medications on File Prior to Visit  Medication Sig Dispense Refill  . acetaminophen (TYLENOL) 325 MG tablet Take 650 mg by mouth every 6 (six) hours as needed for mild pain or headache.     . fluticasone furoate-vilanterol (BREO ELLIPTA) 100-25 MCG/INH AEPB Inhale 1 puff into the lungs daily. 1 each 3  . ibuprofen (ADVIL,MOTRIN) 200 MG tablet Take 2 tablets (400 mg total) by mouth every 8 (eight) hours as needed for headache or moderate pain. 30 tablet 0  . meloxicam (MOBIC) 15 MG tablet Take 1 tablet (15 mg total) by mouth daily. PATIENT NEEDS OFFICE VISIT FOR ADDITIONAL REFILLS (Patient taking differently: Take 15 mg by mouth daily. ) 30 tablet 3  . nilotinib (TASIGNA) 150 MG capsule Take 2 capsules (300 mg total) by mouth every 12 (twelve) hours. 360 capsule 0  . oxyCODONE (OXY IR/ROXICODONE) 5 MG immediate release tablet Take 5 mg by mouth every 4-6 hours PRN severe pain. 30 tablet 0  . PROAIR HFA 108 (90 Base) MCG/ACT inhaler Inhale 2 puffs into the lungs every 4 (four) hours as needed for wheezing. 1 Inhaler 1  . rizatriptan (MAXALT-MLT) 10 MG disintegrating tablet Take 1 tablet (10 mg total) by mouth as needed for migraine. May repeat in 2 hours  if needed 9 tablet 11  . sertraline (ZOLOFT) 50 MG tablet Take 1 tablet (50 mg total) by mouth daily. 30 tablet 1  . topiramate (TOPAMAX) 50 MG tablet Take 1 tablet (50 mg total) by mouth 2 (two) times daily. 60 tablet 12   Current Facility-Administered Medications on File Prior to Visit  Medication Dose Route Frequency Provider Last Rate Last Dose  . ondansetron (ZOFRAN-ODT) disintegrating tablet 8 mg  8 mg Oral Once Leandrew Koyanagi, MD         Blood pressure 121/87, pulse 90, resp. rate 18, height 5\' 10"  (1.778 m), weight 190 lb (86.2 kg), SpO2 99 %.  Subjective: Patient presents the office today for a wound inspection.  He is worried that a stitches poking out of the skin.  Objective  Wound: The thoracotomy site is well-healed with half a centimeter stitch protruding from the skin on the medial aspect of the incision.   Assessment & Plan:  I removed the stitch and its associated knot.  A pinpoint amount of drainage emerged.  I could not express any more drainage by applying pressure to the site.  I saturated the incision with Betadine and covered the pin point size hole with a sterile 2 x 2 gauze dressing and tape.  I instructed the patient to continue cleaning the incision as instructed.  If a tiny  amount of drainage continues to drain then to cover it with a sterile 2 x 2 dressing and tape.  If he notices increased redness around the area or increased drainage he is to call our office right away and report for another wound check.  I did not think any antibiotics are indicated at this time.  The patient denies fever, chills, or other signs of systemic infection.  He was at work earlier today and plans to return to work at this time.  He is welcome to call our office with any questions or concerns.    Elgie Collard 04/13/2017

## 2017-04-17 LAB — ACID FAST CULTURE WITH REFLEXED SENSITIVITIES (MYCOBACTERIA): Acid Fast Culture: NEGATIVE

## 2017-04-23 ENCOUNTER — Ambulatory Visit: Payer: Managed Care, Other (non HMO) | Admitting: Pulmonary Disease

## 2017-08-13 ENCOUNTER — Other Ambulatory Visit: Payer: Self-pay

## 2017-08-13 ENCOUNTER — Other Ambulatory Visit (INDEPENDENT_AMBULATORY_CARE_PROVIDER_SITE_OTHER): Payer: Managed Care, Other (non HMO)

## 2017-08-13 ENCOUNTER — Telehealth: Payer: Self-pay | Admitting: Internal Medicine

## 2017-08-13 ENCOUNTER — Encounter (HOSPITAL_COMMUNITY): Payer: Self-pay

## 2017-08-13 ENCOUNTER — Ambulatory Visit: Payer: Managed Care, Other (non HMO) | Admitting: Internal Medicine

## 2017-08-13 ENCOUNTER — Other Ambulatory Visit: Payer: Managed Care, Other (non HMO)

## 2017-08-13 ENCOUNTER — Inpatient Hospital Stay (HOSPITAL_COMMUNITY)
Admission: EM | Admit: 2017-08-13 | Discharge: 2017-08-16 | DRG: 842 | Disposition: A | Discharge status: Home / Self Care | Payer: 59 | Attending: Internal Medicine | Admitting: Internal Medicine

## 2017-08-13 ENCOUNTER — Encounter: Payer: Self-pay | Admitting: Internal Medicine

## 2017-08-13 VITALS — BP 110/64 | HR 99 | Temp 99.1°F | Ht 70.0 in | Wt 190.0 lb

## 2017-08-13 DIAGNOSIS — R040 Epistaxis: Secondary | ICD-10-CM | POA: Diagnosis not present

## 2017-08-13 DIAGNOSIS — R5383 Other fatigue: Secondary | ICD-10-CM | POA: Diagnosis not present

## 2017-08-13 DIAGNOSIS — D61818 Other pancytopenia: Secondary | ICD-10-CM

## 2017-08-13 DIAGNOSIS — Z836 Family history of other diseases of the respiratory system: Secondary | ICD-10-CM

## 2017-08-13 DIAGNOSIS — F322 Major depressive disorder, single episode, severe without psychotic features: Secondary | ICD-10-CM

## 2017-08-13 DIAGNOSIS — Z8489 Family history of other specified conditions: Secondary | ICD-10-CM

## 2017-08-13 DIAGNOSIS — D72829 Elevated white blood cell count, unspecified: Secondary | ICD-10-CM | POA: Diagnosis not present

## 2017-08-13 DIAGNOSIS — F329 Major depressive disorder, single episode, unspecified: Secondary | ICD-10-CM | POA: Diagnosis present

## 2017-08-13 DIAGNOSIS — D649 Anemia, unspecified: Secondary | ICD-10-CM

## 2017-08-13 DIAGNOSIS — D6959 Other secondary thrombocytopenia: Secondary | ICD-10-CM | POA: Diagnosis present

## 2017-08-13 DIAGNOSIS — R0609 Other forms of dyspnea: Secondary | ICD-10-CM

## 2017-08-13 DIAGNOSIS — C9212 Chronic myeloid leukemia, BCR/ABL-positive, in relapse: Principal | ICD-10-CM | POA: Diagnosis present

## 2017-08-13 DIAGNOSIS — R918 Other nonspecific abnormal finding of lung field: Secondary | ICD-10-CM

## 2017-08-13 DIAGNOSIS — D731 Hypersplenism: Secondary | ICD-10-CM | POA: Diagnosis present

## 2017-08-13 DIAGNOSIS — C9210 Chronic myeloid leukemia, BCR/ABL-positive, not having achieved remission: Secondary | ICD-10-CM | POA: Diagnosis not present

## 2017-08-13 DIAGNOSIS — E876 Hypokalemia: Secondary | ICD-10-CM | POA: Diagnosis not present

## 2017-08-13 DIAGNOSIS — E790 Hyperuricemia without signs of inflammatory arthritis and tophaceous disease: Secondary | ICD-10-CM

## 2017-08-13 DIAGNOSIS — Z79899 Other long term (current) drug therapy: Secondary | ICD-10-CM | POA: Diagnosis not present

## 2017-08-13 DIAGNOSIS — Z9114 Patient's other noncompliance with medication regimen: Secondary | ICD-10-CM | POA: Diagnosis not present

## 2017-08-13 DIAGNOSIS — R0602 Shortness of breath: Secondary | ICD-10-CM | POA: Diagnosis present

## 2017-08-13 DIAGNOSIS — D696 Thrombocytopenia, unspecified: Secondary | ICD-10-CM | POA: Diagnosis not present

## 2017-08-13 DIAGNOSIS — Z9119 Patient's noncompliance with other medical treatment and regimen: Secondary | ICD-10-CM | POA: Diagnosis not present

## 2017-08-13 DIAGNOSIS — G43909 Migraine, unspecified, not intractable, without status migrainosus: Secondary | ICD-10-CM | POA: Diagnosis present

## 2017-08-13 DIAGNOSIS — D638 Anemia in other chronic diseases classified elsewhere: Secondary | ICD-10-CM | POA: Diagnosis present

## 2017-08-13 LAB — COMPREHENSIVE METABOLIC PANEL
ALBUMIN: 3.5 g/dL (ref 3.5–5.0)
ALT: 12 U/L (ref 0–53)
ALT: 13 U/L — ABNORMAL LOW (ref 17–63)
AST: 19 U/L (ref 0–37)
AST: 21 U/L (ref 15–41)
Albumin: 4 g/dL (ref 3.5–5.2)
Alkaline Phosphatase: 92 U/L (ref 39–117)
Alkaline Phosphatase: 86 U/L (ref 38–126)
Anion gap: 10 (ref 5–15)
BILIRUBIN TOTAL: 1.4 mg/dL — AB (ref 0.3–1.2)
BUN: 11 mg/dL (ref 6–23)
BUN: 12 mg/dL (ref 6–20)
CHLORIDE: 103 meq/L (ref 96–112)
CHLORIDE: 106 mmol/L (ref 101–111)
CO2: 26 mEq/L (ref 19–32)
CO2: 24 mmol/L (ref 22–32)
Calcium: 9.2 mg/dL (ref 8.4–10.5)
Calcium: 9 mg/dL (ref 8.9–10.3)
Creatinine, Ser: 1.12 mg/dL (ref 0.40–1.50)
Creatinine, Ser: 1.15 mg/dL (ref 0.61–1.24)
GFR calc Af Amer: >60 mL/min (ref >60)
GFR calc non Af Amer: >60 mL/min (ref >60)
GFR: 80.24 mL/min (ref >60.00)
GLUCOSE: 90 mg/dL (ref 70–99)
GLUCOSE: 93 mg/dL (ref 65–99)
POTASSIUM: 3.2 mmol/L — AB (ref 3.5–5.1)
Potassium: 3.8 mEq/L (ref 3.5–5.1)
SODIUM: 140 mmol/L (ref 135–145)
Sodium: 137 mEq/L (ref 135–145)
TOTAL PROTEIN: 6.5 g/dL (ref 6.5–8.1)
Total Bilirubin: 1.2 mg/dL (ref 0.2–1.2)
Total Protein: 7.1 g/dL (ref 6.0–8.3)

## 2017-08-13 LAB — CBC
HCT: 15.4 % — CL (ref 39.0–52.0)
Hemoglobin: 4.8 g/dL — CL (ref 13.0–17.0)
MCHC: 31.2 g/dL (ref 30.0–36.0)
MCV: 73.5 fl — AB (ref 78.0–100.0)
PLATELETS: 57 10*3/uL — AB (ref 150.0–400.0)
RBC: 2.1 Mil/uL — ABNORMAL LOW (ref 4.22–5.81)
RDW: 22.2 % — AB (ref 11.5–15.5)
WBC: 270.9 10*3/uL (ref 4.0–10.5)

## 2017-08-13 LAB — RETICULOCYTES
RBC.: 1.99 MIL/uL — ABNORMAL LOW (ref 4.22–5.81)
Retic Count, Absolute: 51.7 10*3/uL (ref 19.0–186.0)
Retic Ct Pct: 2.6 % (ref 0.4–3.1)

## 2017-08-13 LAB — CBC WITH DIFFERENTIAL/PLATELET
BAND NEUTROPHILS: 11 %
BASOS PCT: 0 %
Basophils Absolute: 0 10*3/uL (ref 0.0–0.1)
Blasts: 0 %
EOS ABS: 2.2 10*3/uL — AB (ref 0.0–0.7)
Eosinophils Relative: 1 %
HCT: 15 % — ABNORMAL LOW (ref 39.0–52.0)
Hemoglobin: 4.8 g/dL — CL (ref 13.0–17.0)
LYMPHS ABS: 11 10*3/uL — AB (ref 0.7–4.0)
Lymphocytes Relative: 5 %
MCH: 24.1 pg — AB (ref 26.0–34.0)
MCHC: 32 g/dL (ref 30.0–36.0)
MCV: 75.4 fL — ABNORMAL LOW (ref 78.0–100.0)
MONO ABS: 2.2 10*3/uL — AB (ref 0.1–1.0)
MONOS PCT: 1 %
Metamyelocytes Relative: 5 %
Myelocytes: 10 %
NEUTROS ABS: 127.9 10*3/uL — AB (ref 1.7–7.7)
Neutrophils Relative %: 30 %
Other: 35 %
PLATELETS: 38 10*3/uL — AB (ref 150–400)
Promyelocytes Relative: 2 %
RBC: 1.99 MIL/uL — ABNORMAL LOW (ref 4.22–5.81)
RDW: 21 % — AB (ref 11.5–15.5)
WBC: 220.5 10*3/uL (ref 4.0–10.5)
nRBC: 4 /100 WBC — ABNORMAL HIGH

## 2017-08-13 LAB — FERRITIN
FERRITIN: 843.3 ng/mL — AB (ref 22.0–322.0)
Ferritin: 801 ng/mL — ABNORMAL HIGH (ref 24–336)

## 2017-08-13 LAB — D-DIMER, QUANTITATIVE: D-Dimer, Quant: 1.78 mcg/mL FEU — ABNORMAL HIGH (ref <0.50)

## 2017-08-13 LAB — APTT: APTT: 37 s — AB (ref 24–36)

## 2017-08-13 LAB — SAVE SMEAR

## 2017-08-13 LAB — DIC (DISSEMINATED INTRAVASCULAR COAGULATION)PANEL
D-Dimer, Quant: 1.07 ug/mL-FEU — ABNORMAL HIGH (ref 0.00–0.50)
Fibrinogen: 427 mg/dL (ref 210–475)
INR: 1.23
Platelets: 38 10*3/uL — ABNORMAL LOW (ref 150–400)
aPTT: 35 seconds (ref 24–36)

## 2017-08-13 LAB — IRON AND TIBC
IRON: 48 ug/dL (ref 45–182)
SATURATION RATIOS: 22 % (ref 17.9–39.5)
TIBC: 216 ug/dL — AB (ref 250–450)
UIBC: 168 ug/dL

## 2017-08-13 LAB — MAGNESIUM: MAGNESIUM: 1.8 mg/dL (ref 1.7–2.4)

## 2017-08-13 LAB — POC OCCULT BLOOD, ED: Fecal Occult Bld: POSITIVE — AB

## 2017-08-13 LAB — VITAMIN D 25 HYDROXY (VIT D DEFICIENCY, FRACTURES): VITD: 21.97 ng/mL — AB (ref 30.00–100.00)

## 2017-08-13 LAB — PREPARE RBC (CROSSMATCH)

## 2017-08-13 LAB — PROTIME-INR
INR: 1.29
Prothrombin Time: 16 seconds — ABNORMAL HIGH (ref 11.4–15.2)

## 2017-08-13 LAB — DIC (DISSEMINATED INTRAVASCULAR COAGULATION) PANEL
PROTHROMBIN TIME: 15.4 s — AB (ref 11.4–15.2)
SMEAR REVIEW: NONE SEEN

## 2017-08-13 LAB — BRAIN NATRIURETIC PEPTIDE: PRO B NATRI PEPTIDE: 79 pg/mL (ref 0.0–100.0)

## 2017-08-13 LAB — TSH: TSH: 2.78 u[IU]/mL (ref 0.35–4.50)

## 2017-08-13 LAB — URIC ACID: URIC ACID, SERUM: 11 mg/dL — AB (ref 4.4–7.6)

## 2017-08-13 LAB — VITAMIN B12: VITAMIN B 12: 1489 pg/mL — AB (ref 180–914)

## 2017-08-13 LAB — LACTATE DEHYDROGENASE: LDH: 1626 U/L — ABNORMAL HIGH (ref 98–192)

## 2017-08-13 LAB — FOLATE: Folate: 6.7 ng/mL (ref >5.9)

## 2017-08-13 LAB — T4, FREE: FREE T4: 0.95 ng/dL (ref 0.60–1.60)

## 2017-08-13 MED ORDER — HYDROCODONE-ACETAMINOPHEN 10-325 MG PO TABS
1.0000 | ORAL_TABLET | ORAL | Status: DC | PRN
  Administered 2017-08-15: 1 via ORAL
  Filled 2017-08-13: qty 1

## 2017-08-13 MED ORDER — PROCHLORPERAZINE EDISYLATE 5 MG/ML IJ SOLN
10.0000 mg | Freq: Once | INTRAMUSCULAR | Status: AC
  Administered 2017-08-13: 10 mg via INTRAVENOUS
  Filled 2017-08-13: qty 2

## 2017-08-13 MED ORDER — SODIUM CHLORIDE 0.9 % IV SOLN
INTRAVENOUS | Status: DC
  Administered 2017-08-13 (×2): via INTRAVENOUS

## 2017-08-13 MED ORDER — SODIUM CHLORIDE 0.9 % IV SOLN: 10.0000 mL/h | Freq: Once | INTRAVENOUS | Status: DC

## 2017-08-13 MED ORDER — ACETAMINOPHEN 650 MG RE SUPP: 650.0000 mg | Freq: Four times a day (QID) | RECTAL | Status: DC | PRN

## 2017-08-13 MED ORDER — ONDANSETRON HCL 4 MG PO TABS: 4.0000 mg | ORAL_TABLET | Freq: Four times a day (QID) | ORAL | Status: DC | PRN

## 2017-08-13 MED ORDER — SODIUM CHLORIDE 0.9 % IV SOLN
80.0000 mg | Freq: Once | INTRAVENOUS | Status: AC
  Administered 2017-08-13: 18:00:00 80 mg via INTRAVENOUS
  Filled 2017-08-13: qty 80

## 2017-08-13 MED ORDER — ZOLPIDEM TARTRATE 5 MG PO TABS
5.0000 mg | ORAL_TABLET | Freq: Every evening | ORAL | Status: DC | PRN
  Administered 2017-08-13 – 2017-08-15 (×3): 5 mg via ORAL
  Filled 2017-08-13 (×3): qty 1

## 2017-08-13 MED ORDER — ESCITALOPRAM OXALATE 10 MG PO TABS
10.0000 mg | ORAL_TABLET | Freq: Every day | ORAL | Status: DC
  Administered 2017-08-13 – 2017-08-15 (×3): 10 mg via ORAL
  Filled 2017-08-13 (×3): qty 1

## 2017-08-13 MED ORDER — ACETAMINOPHEN 325 MG PO TABS
650.0000 mg | ORAL_TABLET | Freq: Four times a day (QID) | ORAL | Status: DC | PRN
  Administered 2017-08-14 – 2017-08-15 (×3): 650 mg via ORAL
  Filled 2017-08-13 (×3): qty 2

## 2017-08-13 MED ORDER — SODIUM CHLORIDE 0.45 % IV SOLN
INTRAVENOUS | Status: DC
  Administered 2017-08-13 – 2017-08-16 (×6): via INTRAVENOUS

## 2017-08-13 MED ORDER — ALLOPURINOL 100 MG PO TABS
100.0000 mg | ORAL_TABLET | Freq: Two times a day (BID) | ORAL | Status: DC
  Administered 2017-08-13 – 2017-08-16 (×6): 100 mg via ORAL
  Filled 2017-08-13 (×6): qty 1

## 2017-08-13 MED ORDER — DIPHENHYDRAMINE HCL 50 MG/ML IJ SOLN
25.0000 mg | Freq: Once | INTRAMUSCULAR | Status: AC
  Administered 2017-08-13: 25 mg via INTRAVENOUS
  Filled 2017-08-13: qty 1

## 2017-08-13 MED ORDER — ESCITALOPRAM OXALATE 10 MG PO TABS: 10.0000 mg | ORAL_TABLET | Freq: Every day | ORAL | 1 refills | Status: DC

## 2017-08-13 MED ORDER — ONDANSETRON HCL 4 MG/2ML IJ SOLN: 4.0000 mg | Freq: Four times a day (QID) | INTRAMUSCULAR | Status: DC | PRN

## 2017-08-13 NOTE — Patient Instructions (Signed)
We have sent in the lexapro today to help with the depression.   We are checking the labs today.

## 2017-08-13 NOTE — ED Provider Notes (Signed)
[facility] DEPT Provider Note   CSN: [id number] Arrival date & time: 08/13/17  1534     History   Chief Complaint Chief Complaint  Patient presents with  . Abnormal Lab    Hgb-4.8    HPI   Blood pressure 121/74, pulse (!) 101, temperature 99.9 F (37.7 C), temperature source Oral, height 5\' 10"  (1.778 m), weight 86.2 kg (190 lb), SpO2 99 %.  Nathan Kerr is a 32 y.o. male with past medical history significant for CML (followed by [facility], taking Nilotinib) complaining of worsening dyspnea on exertion, fatigue, shortness of breath, palpitations and presyncope over the last several weeks.  He has noted darker than normal stool and nosebleeds recently.  He denies any true syncope, chest pain, large volume diarrhea, abdominal pain.   Past Medical History:  Diagnosis Date  . Allergy   . Asthma    Childhood, not currently active  . Chronic myeloid leukemia in remission ([place])   . CML (chronic myelocytic leukemia) ([place])   . Depression   . Leukemia ([place]) 2010  . Migraine     Patient Active Problem List   Diagnosis Date Noted  . SOB (shortness of breath) 08/13/2017  . 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 Lung nodules 03/02/2017  . Grief 02/12/2017  . Current severe episode of major depressive disorder without psychotic features without prior episode ([place]) 02/12/2017  . Sinus headache 12/25/2016  . History of migraine headaches 10/23/2016  . Asthma, chronic, unspecified asthma severity, with acute exacerbation 10/22/2016  . Hilar mass 10/21/2016  . Class 1 obesity due to excess calories without serious comorbidity with body mass index (BMI) of 32.0 to 32.9 in adult 07/17/2016  . Allergic rhinitis 10/16/2013  . Migraine 03/12/2013  . CML (chronic myelocytic leukemia) ([place]) 10/27/2011    Past Surgical History:  Procedure Laterality Date  . APPENDECTOMY    . BRONCHOSCOPY    . FINGER SURGERY Left    index finger  .  LYMPH NODE BIOPSY Left 03/02/2017   Procedure: LYMPH NODE BIOPSY;  Surgeon: Melrose Nakayama, MD;  Location: [facility];  Service: Thoracic;  Laterality: Left;  [name] VIDEO ASSISTED THORACOSCOPY (VATS)/WEDGE RESECTION Left 03/02/2017   Procedure: LEFT VIDEO ASSISTED THORACOSCOPY (VATS)/LEFT LOWER WEDGE AND LEFT UPPER LOBE RESECTION ;  Surgeon: Melrose Nakayama, MD;  Location: [facility];  Service: Thoracic;  Laterality: Left;  [name] VIDEO BRONCHOSCOPY Bilateral 10/23/2016   Procedure: VIDEO BRONCHOSCOPY WITHOUT FLUORO;  Surgeon: Rigoberto Noel, MD;  Location: [facility];  Service: Cardiopulmonary;  Laterality: Bilateral;        Home Medications    Prior to Admission medications   Medication Sig Start Date End Date Taking? Authorizing Provider  acetaminophen (TYLENOL) 325 MG tablet Take 650 mg by mouth every 6 (six) hours as needed for mild pain or headache.    Yes [provider]  ibuprofen (ADVIL,MOTRIN) 200 MG tablet Take 200 mg by mouth every 6 (six) hours as needed for moderate pain.   Yes [provider]  nilotinib (TASIGNA) 150 MG capsule Take 2 capsules (300 mg total) by mouth every 12 (twelve) hours. 11/15/16  Yes Wyatt Portela, MD  escitalopram (LEXAPRO) 10 MG tablet Take 1 tablet (10 mg total) by mouth daily. 08/13/17   Hoyt Koch, MD  fluticasone furoate-vilanterol (BREO ELLIPTA) 100-25 MCG/INH AEPB Inhale 1 puff into the lungs daily. Patient not taking: Reported on 08/13/2017 11/15/16   Rigoberto Noel, MD  ibuprofen (ADVIL,MOTRIN) 200 MG tablet Take 2 tablets (400 mg total) by mouth every 8 (eight) hours as needed for headache or moderate pain. Patient not taking: Reported on 08/13/2017 03/05/17   Nani Skillern, PA-C  meloxicam (MOBIC) 15 MG tablet Take 1 tablet (15 mg total) by mouth daily. PATIENT NEEDS OFFICE VISIT FOR ADDITIONAL REFILLS Patient not taking: Reported on 08/13/2017 07/05/16   Wardell Honour, MD  [facility] HFA 108 [phone] Base) MCG/ACT inhaler  Inhale 2 puffs into the lungs every 4 (four) hours as needed for wheezing. 10/23/16   Debbe Odea, MD  rizatriptan (MAXALT-MLT) 10 MG disintegrating tablet Take 1 tablet (10 mg total) by mouth as needed for migraine. May repeat in 2 hours if needed Patient not taking: Reported on 08/13/2017 01/01/17   Penumalli, Earlean Polka, MD  topiramate (TOPAMAX) 50 MG tablet Take 1 tablet (50 mg total) by mouth 2 (two) times daily. Patient not taking: Reported on 08/13/2017 04/02/17   Penni Bombard, MD    Family History Family History  Problem Relation Age of Onset  . Sleep apnea Mother   . Migraines Father     Social History Social History   Tobacco Use  . Smoking status: Never Smoker  . Smokeless tobacco: Never Used  . Tobacco comment: occas cigar  Substance Use Topics  . Alcohol use: No    Comment: not currently while on SSRI's  . Drug use: No     Allergies   Patient has no known allergies.   Review of Systems Review of Systems  A complete review of systems was obtained and all systems are negative except as noted in the HPI and PMH.    Physical Exam Updated Vital Signs BP 112/71   Pulse (!) 105   Temp 100 F (37.8 C) (Oral)   Resp 18   Ht 5\' 10"  (1.778 m)   Wt 86.2 kg (190 lb)   SpO2 98%   BMI 27.26 kg/m   Physical Exam  Constitutional: He is oriented to person, place, and time. He appears well-developed and well-nourished. No distress.  HENT:  Head: Normocephalic and atraumatic.  Mouth/Throat: Oropharynx is clear and moist.  +Conjunctival Pallor  Eyes: Pupils are equal, round, and reactive to light. Conjunctivae and EOM are normal.  No TTP of maxillary or frontal sinuses  No TTP or induration of temporal arteries bilaterally  Neck: Normal range of motion. Neck supple.  FROM to C-spine. Pt can touch chin to chest without discomfort. No TTP of midline cervical spine.   Cardiovascular: Normal rate, regular rhythm and intact distal pulses.  Pulmonary/Chest: Effort  normal and breath sounds normal. No respiratory distress. He has no wheezes. He has no rales. He exhibits no tenderness.  Abdominal: Soft. Bowel sounds are normal. There is no tenderness.  Musculoskeletal: Normal range of motion. He exhibits no edema or tenderness.  Neurological: He is alert and oriented to person, place, and time. No cranial nerve deficit.  II-Visual fields grossly intact. III/IV/VI-Extraocular movements intact.  Pupils reactive bilaterally. V/VII-Smile symmetric, equal eyebrow raise,  facial sensation intact VIII- Hearing grossly intact IX/X-Normal gag XI-bilateral shoulder shrug XII-midline tongue extension Motor: 5/5 bilaterally with normal tone and bulk Cerebellar: Normal finger-to-nose  and normal heel-to-shin test.   Romberg negative Ambulates with a coordinated gait   Skin: He is not diaphoretic.  Psychiatric: He has a normal mood and affect.  Nursing note and vitals reviewed.    ED Treatments / Results  Labs (all labs ordered  are listed, but only abnormal results are displayed) Labs Reviewed  CBC WITH DIFFERENTIAL/PLATELET - Abnormal; Notable for the following components:      Result Value   WBC 220.5 (*)    RBC 1.99 (*)    Hemoglobin 4.8 (*)    HCT 15.0 (*)    MCV 75.4 (*)    MCH 24.1 (*)    RDW 21.0 (*)    Platelets 38 (*)    nRBC 4 (*)    Neutro Abs 127.9 (*)    Lymphs Abs 11.0 (*)    Monocytes Absolute 2.2 (*)    Eosinophils Absolute 2.2 (*)    All other components within normal limits  COMPREHENSIVE METABOLIC PANEL - Abnormal; Notable for the following components:   Potassium 3.2 (*)    ALT 13 (*)    Total Bilirubin 1.4 (*)    All other components within normal limits  PROTIME-INR - Abnormal; Notable for the following components:   Prothrombin Time 16.0 (*)    All other components within normal limits  RETICULOCYTES - Abnormal; Notable for the following components:   RBC. 1.99 (*)    All other components within normal limits  APTT -  Abnormal; Notable for the following components:   aPTT 37 (*)    All other components within normal limits  DIC (DISSEMINATED INTRAVASCULAR COAGULATION) PANEL - Abnormal; Notable for the following components:   Prothrombin Time 15.4 (*)    D-Dimer, Quant 1.07 (*)    Platelets 38 (*)    All other components within normal limits  URIC ACID - Abnormal; Notable for the following components:   Uric Acid, Serum 11.0 (*)    All other components within normal limits  LACTATE DEHYDROGENASE - Abnormal; Notable for the following components:   LDH 1,626 (*)    All other components within normal limits  POC OCCULT BLOOD, ED - Abnormal; Notable for the following components:   Fecal Occult Bld POSITIVE (*)    All other components within normal limits  MAGNESIUM  SAVE SMEAR  VITAMIN B12  FOLATE  IRON AND TIBC  FERRITIN  PATHOLOGIST SMEAR REVIEW  CBC  BASIC METABOLIC PANEL  BCR-ABL1 KINASE DOMAIN MUTATION ANALYSIS  BCR-ABL1, CML/ALL, PCR, QUANT  POC OCCULT BLOOD, ED  TYPE AND SCREEN  ABO/RH  PREPARE RBC (CROSSMATCH)    EKG None  Radiology No results found.  Procedures Procedures (including critical care time)  CRITICAL CARE Performed by: Monico Blitz   Total critical care time: 45 minutes  Critical care time was exclusive of separately billable procedures and treating other patients.  Critical care was necessary to treat or prevent imminent or life-threatening deterioration.  Critical care was time spent personally by me on the following activities: development of treatment plan with patient and/or surrogate as well as nursing, discussions with consultants, evaluation of patient's response to treatment, examination of patient, obtaining history from patient or surrogate, ordering and performing treatments and interventions, ordering and review of laboratory studies, ordering and review of radiographic studies, pulse oximetry and re-evaluation of patient's  condition.   Medications Ordered in ED Medications  0.45 % sodium chloride infusion (has no administration in time range)  acetaminophen (TYLENOL) tablet 650 mg (has no administration in time range)    Or  acetaminophen (TYLENOL) suppository 650 mg (has no administration in time range)  zolpidem (AMBIEN) tablet 5 mg (has no administration in time range)  ondansetron (ZOFRAN) tablet 4 mg (has no administration in time range)  Or  ondansetron (ZOFRAN) injection 4 mg (has no administration in time range)  HYDROcodone-acetaminophen (NORCO) 10-325 MG per tablet 1 tablet (has no administration in time range)  escitalopram (LEXAPRO) tablet 10 mg (has no administration in time range)  pantoprazole (PROTONIX) 80 mg in sodium chloride 0.9 % 100 mL IVPB (0 mg Intravenous Stopped 08/13/17 1824)  prochlorperazine (COMPAZINE) injection 10 mg (10 mg Intravenous Given 08/13/17 1759)  diphenhydrAMINE (BENADRYL) injection 25 mg (25 mg Intravenous Given 08/13/17 1759)     Initial Impression / Assessment and Plan / ED Course  I have reviewed the triage vital signs and the nursing notes.  Pertinent labs & imaging results that were available during my care of the patient were reviewed by me and considered in my medical decision making (see chart for details).     Vitals:   08/13/17 1624 08/13/17 1700 08/13/17 1800 08/13/17 1808  BP: 99/64 107/72 112/71   Pulse: (!) 101 97 (!) 105   Resp: 18     Temp:    100 F (37.8 C)  TempSrc:    Oral  SpO2: 97% 100% 98%   Weight:      Height:        Medications  0.45 % sodium chloride infusion (has no administration in time range)  acetaminophen (TYLENOL) tablet 650 mg (has no administration in time range)    Or  acetaminophen (TYLENOL) suppository 650 mg (has no administration in time range)  zolpidem (AMBIEN) tablet 5 mg (has no administration in time range)  ondansetron (ZOFRAN) tablet 4 mg (has no administration in time range)    Or  ondansetron  (ZOFRAN) injection 4 mg (has no administration in time range)  HYDROcodone-acetaminophen (NORCO) 10-325 MG per tablet 1 tablet (has no administration in time range)  escitalopram (LEXAPRO) tablet 10 mg (has no administration in time range)  pantoprazole (PROTONIX) 80 mg in sodium chloride 0.9 % 100 mL IVPB (0 mg Intravenous Stopped 08/13/17 1824)  prochlorperazine (COMPAZINE) injection 10 mg (10 mg Intravenous Given 08/13/17 1759)  diphenhydrAMINE (BENADRYL) injection 25 mg (25 mg Intravenous Given 08/13/17 1759)    Nathan Kerr is 32 y.o. male presenting with symptomatic anemia.  He states he is been having some dyspnea on exertion, shortness of breath generalized fatigue over the course the last several weeks.  Also had headaches.  He has a history of CML.  He is followed by Dr. Alen Blew.  He is reporting a darker than normal stool.  Digital rectal exam with normal stool color however, Guaiac has returned positive.  He is never had any issues with GI bleed in the past, platelets still pending.  He does not drink alcohol he states that he had a headache for the last week and he has been taking Goody powder, naproxen and Aleve etc. he says that he has not been taking them to excess may be 1 or 2 times a day never above the recommended dosage.  Called to consult from Dr. Baker Janus appreciated: Given the lack of blasts on the smear he thinks that this is probably just uncontrolled CML secondary to his medication noncompliance.  He will come to evaluate the patient and has advised me to order his smear and he will evaluate that.  Admitted to [facility] hospitalist, discussed with Dr. Melvia Heaps accepts admission.   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

## 2017-08-13 NOTE — ED Triage Notes (Signed)
Patient had blood work completed and was  Told by his PCPto get to the ED. Hgb-4.8

## 2017-08-13 NOTE — Progress Notes (Signed)
   Subjective:    Patient ID: Nathan Kerr, male    DOB: 23-May-1985, 32 y.o.   MRN: [id number]  HPI The patient is a 32 YO man coming in for several concerns including SOB (denies cough much, mild allergies, gets SOB with short walking or 1 flight of stairs, previously was running multiple miles, having fatigue, SOB, some chest tightness, some change to nail color, concurrent AML and is concerned about that, stopped taking meds for some time and recently resumed), lethargy (cannot complete normal tasks, very tired and lack of motivation, has AML and previously on meds, stopped due to depression and divorce proceedings, started taking them again recently, denies fevers or chills, denies cough), and depression (going through a divorce, some passive suicidality, no plan, previously took zoloft for a couple months which helped, they thought they could work things out and this made him feel better, not seeing counselor currently). Also recently had VATs procedure to evaluate lung nodules which ended up to be necrotic without localizing and no cancerous changes. He recovered from that well.   Review of Systems  Constitutional: Positive for activity change, appetite change and fatigue. Negative for chills, fever and unexpected weight change.  HENT: Negative.   Eyes: Negative.   Respiratory: Positive for chest tightness and shortness of breath. Negative for cough, choking and wheezing.   Cardiovascular: Positive for palpitations. Negative for chest pain and leg swelling.  Gastrointestinal: Negative for abdominal distention, abdominal pain, anal bleeding, constipation, diarrhea, nausea and vomiting.  Musculoskeletal: Positive for myalgias. Negative for arthralgias, back pain, neck pain and neck stiffness.  Skin: Negative.   Neurological: Negative for dizziness, tremors, seizures, speech difficulty, weakness, numbness and headaches.  Psychiatric/Behavioral: Positive for decreased concentration, dysphoric  mood and sleep disturbance. Negative for agitation, behavioral problems, confusion, self-injury and suicidal ideas. The patient is nervous/anxious.       Objective:   Physical Exam  Constitutional: He is oriented to person, place, and time. He appears well-developed.  Looks pale  HENT:  Head: Normocephalic and atraumatic.  Eyes: EOM are normal.  Neck: Normal range of motion.  Cardiovascular: Normal rate and regular rhythm.  Loud S1  Pulmonary/Chest: Effort normal and breath sounds normal. No respiratory distress. He has no wheezes. He has no rales.  Abdominal: Soft. Bowel sounds are normal. He exhibits no distension. There is no tenderness. There is no rebound.  Musculoskeletal: He exhibits no edema.  Neurological: He is alert and oriented to person, place, and time. Coordination normal.  Skin: Skin is warm and dry.  Pale skin and nails with ridges  Psychiatric:  Some passive suicidality, no discrete plan or SI/HI.    Vitals:   08/13/17 1331  BP: 110/64  Pulse: 99  Temp: 99.1 F (37.3 C)  TempSrc: Oral  SpO2: 99%  Weight: 190 lb (86.2 kg)  Height: 5\' 10"  (1.778 m)      Assessment & Plan:

## 2017-08-13 NOTE — Assessment & Plan Note (Signed)
Rx for lexapro. Talked to him about counseling. He has no intention of SI/HI. Going through divorce at this time and he is very depressed about this. Back in 3 weeks.

## 2017-08-13 NOTE — Assessment & Plan Note (Signed)
I cannot find discrete explanation from CVTS or pulmonary on their significant or if any autoimmune workup is needed further. The nodules were necrotizing and no culture growth. Likely these are not related to current symptoms although if no other explanation may need CT chest.

## 2017-08-13 NOTE — Telephone Encounter (Signed)
Patient was seen by Dr. Sharlet Salina today.  Patient would like to know if he could transfer to [facility] since [facility] has left.

## 2017-08-13 NOTE — Consult Note (Addendum)
[name]    HEMATOLOGY/ONCOLOGY CONSULTATION NOTE  Date of Service: 08/13/2017  Patient Care Team: Golden Circle, FNP as PCP - General (Family Medicine)  CHIEF COMPLAINTS/PURPOSE OF CONSULTATION:  Leucocytosis/anemia/thrombocytopenia  HISTORY OF PRESENTING ILLNESS:   Nathan Kerr is a  32 y.o. 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 Patient follows with Dr Alen Blew for his [facility] and last followed up for [facility] cares more than 6 months ago. He notes his CML was diagnosed in 2010 and he was initially treated with Imatinib but could not tolerate it due to significant thrombocytopenia despite decreasing the dose to 211m po daily.   He was subsequently treated with Nilotinib and apparently had a good response but uncertain if he achieved MMR status. He has been non compliant with his Nilotinib over the last 6 months or more and has been taking it on and off for the ast 1-2 weeks. He notes that losing his jobs and now being in the process of separating from his wife has been very difficult for him and notes that he has been depressed and has been following with his counselor at "[facility]" weekly. Notes no suicidal or homicidal ideation.  He comes with with increasing shortness of breath for 1-2 months and worsening fatigue. He also notes abdominal fullness. His labs in the ED show significant leucocytosis with WBC count of 220.5k with neutrophilia and myeloid left shift with hgb of 4.8 and PLT count of 38k FOBT was positive but patient notes no overt clinical evidence of GI bleeding.  Some chronic headache similar to his migraines. No FND . No visual blurring. No overt chest pain. Abdominal fullness ++ Has been using Ibuprofen and meloxicam regularly which could add to the thrombocytopenia. Newly started on  lexapro which in addition to NSAIDS can increase the risk of GI bleeding.  MEDICAL HISTORY:  Past Medical History:  Diagnosis Date  . Allergy   . Asthma    Childhood, not currently active  . Chronic myeloid leukemia in remission ([facility]   . C[place] (chronic myelocytic leukemia) ([place]   . Depression   . [place]uk[place]  . Migraine     SURGICAL HISTORY: Past Surgical History:  Procedure Laterality Date  . APPENDECTOMY    . BRONCHOSCOPY    . FINGER SURGERY Left    index finger  . LYMPH NODE BIOPSY Left 03/02/2017   Procedure: LYMPH NODE BIOPSY;  Surgeon: HMelrose Nakayama MD;  [place] [facility]  Service: Thoracic;  Laterality: Left;  .Marland KitchenVIDEO ASSISTED THORACOSCOPY (VATS)/WEDGE RESECTION Left 03/02/2017   Procedure: LEFT VIDEO ASSISTED THORACOSCOPY (VATS)/LEFT LOWER WEDGE AND LEFT UPPER LOBE RESECTION ;  Surgeon: HMelrose Nakayama MD;  [place] [facility]  Service: Thoracic;  Laterality: Left;  .Marland KitchenVIDEO BRONCHOSCOPY Bilateral 10/23/2016   Procedure: VIDEO BRONCHOSCOPY WITHOUT FLUORO;  Surgeon: ARigoberto Noel MD;  [facility];  Service: Cardiopulmonary;  Laterality: Bilateral;    SOCIAL HISTORY: Social History   Socioeconomic History  . Marital status: Married    Spouse name: Not on file  . Number of children: 0  . Years of education: 152 . Highest education level: Not on file  Occupational History    Comment: sales for [employer] Social Needs  . Financial resource strain: Not on file  . Food  insecurity:    Worry: Not on file    Inability: Not on file  . Transportation needs:    Medical: Not on file    Non-medical: Not on file  Tobacco Use  . Smoking status: Never Smoker  . Smokeless tobacco: Never Used  . Tobacco comment: occas cigar  Substance and Sexual Activity  . Alcohol use: No    Comment: not currently while on SSRI's  . Drug use: No  . Sexual activity: Yes    Partners: Female  Lifestyle  . Physical activity:    Days per week: Not on file     Minutes per session: Not on file  . Stress: Not on file  Relationships  . Social connections:    Talks on phone: Not on file    Gets together: Not on file    Attends religious service: Not on file    Active member of club or organization: Not on file    Attends meetings of clubs or organizations: Not on file    Relationship status: Not on file  . Intimate partner violence:    Fear of current or ex partner: Not on file    Emotionally abused: Not on file    Physically abused: Not on file    Forced sexual activity: Not on file  Other Topics Concern  . Not on file  Social History Narrative   Lives with wwife   Fun/Hobby: Video games, woodworking, gardening   Caffeine- coffee, 1-2 cups daily, occas soda and tea    FAMILY HISTORY: Family History  Problem Relation Age of Onset  . Sleep apnea Mother   . Migraines Father     ALLERGIES:  has No Known Allergies.  MEDICATIONS:  Current Facility-Administered Medications  Medication Dose Route Frequency Provider Last Rate Last Dose  . 0.45 % sodium chloride infusion   Intravenous Continuous Roney Jaffe, MD      . acetaminophen (TYLENOL) tablet 650 mg  650 mg Oral Q6H PRN Roney Jaffe, MD       Or  . acetaminophen (TYLENOL) suppository 650 mg  650 mg Rectal Q6H PRN Roney Jaffe, MD      . escitalopram (LEXAPRO) tablet 10 mg  10 mg Oral QHS Roney Jaffe, MD      . HYDROcodone-acetaminophen (NORCO) 10-325 MG per tablet 1 tablet  1 tablet Oral Q4H PRN Roney Jaffe, MD      . ondansetron [facility]) tablet 4 mg  4 mg Oral Q6H PRN Roney Jaffe, MD       Or  . ondansetron (ZOFRAN) injection 4 mg  4 mg Intravenous Q6H PRN Roney Jaffe, MD      . zolpidem (AMBIEN) tablet 5 mg  5 mg Oral QHS PRN,MR X 1 Roney Jaffe, MD       Current Outpatient Medications  Medication Sig Dispense Refill  . acetaminophen (TYLENOL) 325 MG tablet Take 650 mg by mouth every 6 (six) hours as needed for mild pain or headache.     .  ibuprofen (ADVIL,MOTRIN) 200 MG tablet Take 200 mg by mouth every 6 (six) hours as needed for moderate pain.    . nilotinib (TASIGNA) 150 MG capsule Take 2 capsules (300 mg total) by mouth every 12 (twelve) hours. 360 capsule 0  . escitalopram (LEXAPRO) 10 MG tablet Take 1 tablet (10 mg total) by mouth daily. 30 tablet 1  . fluticasone furoate-vilanterol (BREO ELLIPTA) 100-25 MCG/INH AEPB Inhale 1 puff into the lungs daily. (Patient not taking: Reported on  08/13/2017) 1 each 3  . ibuprofen (ADVIL,MOTRIN) 200 MG tablet Take 2 tablets (400 mg total) by mouth every 8 (eight) hours as needed for headache or moderate pain. (Patient not taking: Reported on 08/13/2017) 30 tablet 0  . meloxicam (MOBIC) 15 MG tablet Take 1 tablet (15 mg total) by mouth daily. PATIENT NEEDS OFFICE VISIT FOR ADDITIONAL REFILLS (Patient not taking: Reported on 08/13/2017) 30 tablet 3  . PROAIR HFA 108 (90 Base) MCG/ACT inhaler Inhale 2 puffs into the lungs every 4 (four) hours as needed for wheezing. 1 Inhaler 1  . rizatriptan (MAXALT-MLT) 10 MG disintegrating tablet Take 1 tablet (10 mg total) by mouth as needed for migraine. May repeat in 2 hours if needed (Patient not taking: Reported on 08/13/2017) 9 tablet 11  . topiramate (TOPAMAX) 50 MG tablet Take 1 tablet (50 mg total) by mouth 2 (two) times daily. (Patient not taking: Reported on 08/13/2017) 60 tablet 12    REVIEW OF SYSTEMS:    10 Point review of Systems was done is negative except as noted above.  PHYSICAL EXAMINATION: ECOG PERFORMANCE STATUS: 2 - Symptomatic, <50% confined to bed  . Vitals:   08/13/17 2200 08/13/17 2215  BP: 108/65 107/64  Pulse: 89 91  Resp: (!) 22 19  Temp:  98.8 F (37.1 C)  SpO2: 98% 98%   Filed Weights   08/13/17 1545  Weight: 190 lb (86.2 kg)   .Body mass index is 27.26 kg/m.  GENERAL:alert, in no acute distress and comfortable SKIN: no acute rashes, no significant lesions EYES: conjunctival pallor ++, sclera anicteric OROPHARYNX:  MMM, no exudates, no oropharyngeal erythema or ulceration NECK: supple, no JVD LYMPH:  no palpable lymphadenopathy in the cervical, axillary or inguinal regions LUNGS: clear to auscultation b/l with normal respiratory effort HEART: regular rate & rhythm ABDOMEN:  normoactive bowel sounds , distended, massive splenomegaly by palpation. Extremity: no pedal edema PSYCH: alert & oriented x 3 with fluent speech NEURO: no focal motor/sensory deficits  LABORATORY DATA:  I have reviewed the data as listed  . CBC Latest Ref Rng & Units 08/13/2017 08/13/2017 08/13/2017  WBC 4.0 - 10.5 K/uL - 220.5(HH) 270.9 Repeated and verified X2.(HH)  Hemoglobin 13.0 - 17.0 g/dL - 4.8(LL) 4.8 Repeated and verified X2.(LL)  Hematocrit 39.0 - 52.0 % - 15.0(L) 15.4 Repeated and verified X2.(LL)  Platelets 150 - 400 K/uL 38(L) 38(L) 57.0(L)    . CMP Latest Ref Rng & Units 08/13/2017 08/13/2017 03/04/2017  Glucose 65 - 99 mg/dL 93 90 100(H)  BUN 6 - 20 mg/dL _0 Creatinine 0.61 - 1.24 mg/dL 1.15 1.12 1.14  Sodium 135 - 145 mmol/L 140 137 142  Potassium 3.5 - 5.1 mmol/L 3.2(L) 3.8 3.5  Chloride 101 - 111 mmol/L 106 103 113(H)  CO2 22 - 32 mmol/L _1 Calcium 8.9 - 10.3 mg/dL 9.0 9.2 8.6(L)  Total Protein 6.5 - 8.1 g/dL 6.5 7.1 5.7(L)  Total Bilirubin 0.3 - 1.2 mg/dL 1.4(H) 1.2 0.6  Alkaline Phos 38 - 126 U/L 86 92 45  AST 15 - 41 U/L _2 ALT 17 - 63 U/L 13(L) 12 23   Component     Latest Ref Rng & Units 08/13/2017  Iron     45 - 182 ug/dL 48  TIBC     250 - 450 ug/dL 216 (L)  Saturation Ratios     17.9 - 39.5 % 22  UIBC     ug/dL 168  Retic Ct Pct     0.4 - 3.1 % 2.6  RBC.     4.22 - 5.81 MIL/uL 1.99 (L)  Retic Count, Absolute     19.0 - 186.0 K/uL 51.7  Vitamin B12     180 - 914 pg/mL 1,489 (H)  Folate     >5.9 ng/mL 6.7  Ferritin     24 - 336 ng/mL 801 (H)  Fecal Occult Blood, POC     NEGATIVE POSITIVE (A)  Magnesium     1.7 - 2.4 mg/dL 1.8  Uric Acid, Serum     4.4 - 7.6  mg/dL 11.0 (H)  LDH     98 - 192 U/L 1,626 (H)     RADIOGRAPHIC STUDIES: I have personally reviewed the radiological images as listed and agreed with the findings in the report. No results found.  ASSESSMENT & PLAN:   32 yo with   1) CML -- currently appears to be in accelerated phase CML with significant increase in WBC and associated thrombocytopenia<100k in the absence of overt medication effect.  PBS with some increased blast and significant myeloid left shift.  Notes to clinically have significant splenomegaly related to progression of CML due to treatment non compliance.  2)Elevated LDH - likely from CML . 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 Related to bleeding. Symptomatic hgb 4.8.  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  5) Medication and clinical f/u non compliance.  6) Hyperuricemia due to increased cell turnovert Uric acid 11. No overt renal insufficiency.  PLAN -reviewed labs from today with the patient. -no clinical symptoms to suggest hyperleukocytosis like symptoms that would warrant leukapheresis at this time. -IVF + allopurinol for increased uric acid. May need rasburicase if necessary. -recommended patient bring his supple of Nilotinib and immediately restart this with monitoring for TLS and mx -avid NSAIDS due to thrombocytopenia -BCR-ABL transcription likely at 100% currently. -BCR-ABL kinase mutation testing requested to guide further TKI choice -continue Nilotinib for now. -gradual/slow PRBC transfusion over 3-4 hours to maintain hgb>7 -check CT abd without contrast to evaluate for splenic size -monitor and transfuse platelets for PLT<10k of if bleeding. -consider CT abd to evaluate for splenic size. -consider rpt BM Bx -- discussed with patient. He prefers to not do this unless absolute necessary. -will inform Dr  Alen Blew regarding hospitalization of patient and have him continued to f/y from tomorrow. _IVF to maintain good hydration.  All of the patients questions were answered with apparent satisfaction. The patient knows to call the clinic with any problems, questions or concerns.  I spent 60 minutes counseling the patient face to face. The total time spent in the appointment was 80 minutes and more than 50% was on counseling and direct patient cares.    Sullivan Lone MD [place] AAHIVMS [facility] Hematology/Oncology Physician [facility]  (Office):       [phone] (Work cell):  [phone] (Fax):           [phone]  08/13/2017 10:23 PM

## 2017-08-13 NOTE — Assessment & Plan Note (Signed)
Needs CBC today, suspect worsening of blood counts and suspect moderate at least anemia given symptoms and lack of meds recently. He will continue taking meds.

## 2017-08-13 NOTE — ED Notes (Signed)
ED TO INPATIENT HANDOFF REPORT  Name/Age/Gender Nathan Kerr 31 y.o. male  Code Status    Code Status Orders  (From admission, onward)        Start     Ordered   08/13/17 1857  Full code  Continuous     08/13/17 1858    Code Status History    Date Active Date Inactive Code Status Order ID Comments User Context   03/02/2017 1600 03/05/2017 1736 Full Code [id number]  Odis Luster Inpatient   10/21/2016 1832 10/23/2016 1851 Full Code [id number]  Florencia Reasons, MD Inpatient      Home/SNF/Other Home  Chief Complaint need red blood count sent by PCP [place] pt  Level of Care/Admitting Diagnosis ED Disposition    ED Disposition Condition [place]: [place] [[id number]]  Level of Care: Telemetry [5]  Admit to tele based on following criteria: Other see comments  Comments: CML relapse  Diagnosis: CML (chronic myeloid leukemia) in relapse [facility]) [[id number]]  Admitting Physician: [place], [place]  Attending Physician: Roney Jaffe [[id number]]  Estimated length of stay: 3 - 4 days  Certification:: I certify this patient will need inpatient services for at least 2 midnights  PT Class (Do Not Modify): Inpatient [101]  PT Acc Code (Do Not Modify): Private [1]       Medical History Past Medical History:  Diagnosis Date  . Allergy   . Asthma    Childhood, not currently active  . Chronic myeloid leukemia in remission ([place])   . CML (chronic myelocytic leukemia) ([place])   . Depression   . Leukemia ([place]) 2010  . Migraine     Allergies No Known Allergies  IV Location/Drains/Wounds Patient Lines/Drains/Airways Status   Active Line/Drains/Airways    Name:   Placement date:   Placement time:   Site:   Days:   Peripheral IV 12/27/16 Left Antecubital   12/27/16    0930    Antecubital   229   Incision (Closed) 03/02/17 Chest Left   03/02/17    1340     164          Labs/Imaging Results for orders placed or performed during the  hospital encounter of 08/13/17 (from the past 48 hour(s))  CBC with Differential     Status: Abnormal   Collection Time: 08/13/17  4:38 PM  Result Value Ref Range   WBC 220.5 (HH) 4.0 - 10.5 K/uL    Comment: REPEATED TO VERIFY CRITICAL RESULT CALLED TO, READ BACK BY AND VERIFIED WITH: Dolores Hoose [id number] @ 1726 BY J SCOTTON    RBC 1.99 (L) 4.22 - 5.81 MIL/uL   Hemoglobin 4.8 (LL) 13.0 - 17.0 g/dL    Comment: REPEATED TO VERIFY CRITICAL RESULT CALLED TO, READ BACK BY AND VERIFIED WITH: Dolores Hoose [id number] @ 1726 BY J SCOTTON    HCT 15.0 (L) 39.0 - 52.0 %   MCV 75.4 (L) 78.0 - 100.0 fL   MCH 24.1 (L) 26.0 - 34.0 pg   MCHC 32.0 30.0 - 36.0 g/dL   RDW 21.0 (H) 11.5 - 15.5 %   Platelets 38 (L) 150 - 400 K/uL   Neutrophils Relative % 30 %   Lymphocytes Relative 5 %   Monocytes Relative 1 %   Eosinophils Relative 1 %   Basophils Relative 0 %   Band Neutrophils 11 %   Metamyelocytes Relative 5 %   Myelocytes 10 %   Promyelocytes Relative 2 %  Blasts 0 %   nRBC 4 (H) 0 /100 WBC   Other 35 %   Neutro Abs 127.9 (H) 1.7 - 7.7 K/uL   Lymphs Abs 11.0 (H) 0.7 - 4.0 K/uL   Monocytes Absolute 2.2 (H) 0.1 - 1.0 K/uL   Eosinophils Absolute 2.2 (H) 0.0 - 0.7 K/uL   Basophils Absolute 0.0 0.0 - 0.1 K/uL   RBC Morphology RARE NRBCs    WBC Morphology      MARKED LEFT SHIFT (>5% METAS,MYELOS AND PROS, OCC BLAST NOTED)    Comment: PENDING PATHOLOGIST REVIEW ATYPICAL MONONUCLEAR CELLS PENDING PATHOLOGIST REVIEW WHITE COUNT CONFIRMED ON SMEAR    Smear Review PLATELET COUNT CONFIRMED BY SMEAR     Comment: Performed at [facility], [place] [address], [place], [place] [address]  Comprehensive metabolic panel     Status: Abnormal   Collection Time: 08/13/17  4:38 PM  Result Value Ref Range   Sodium 140 135 - 145 mmol/L   Potassium 3.2 (L) 3.5 - 5.1 mmol/L   Chloride 106 101 - 111 mmol/L   CO2 24 22 - 32 mmol/L   Glucose, Bld 93 65 - 99 mg/dL   BUN 12 6 - 20 mg/dL   Creatinine,  Ser 1.15 0.61 - 1.24 mg/dL   Calcium 9.0 8.9 - 10.3 mg/dL   Total Protein 6.5 6.5 - 8.1 g/dL   Albumin 3.5 3.5 - 5.0 g/dL   AST 21 15 - 41 U/L   ALT 13 (L) 17 - 63 U/L   Alkaline Phosphatase 86 38 - 126 U/L   Total Bilirubin 1.4 (H) 0.3 - 1.2 mg/dL   GFR calc non Af Amer >60 >60 mL/min   GFR calc Af Amer >60 >60 mL/min    Comment: (NOTE) The eGFR has been calculated using the CKD EPI equation. This calculation has not been validated in all clinical situations. eGFR's persistently <60 mL/min signify possible Chronic Kidney Disease.    Anion gap 10 5 - 15    Comment: Performed at [facility], [place] [address], [place], [place] [address]  Protime-INR     Status: Abnormal   Collection Time: 08/13/17  4:38 PM  Result Value Ref Range   Prothrombin Time 16.0 (H) 11.4 - 15.2 seconds   INR 1.29     Comment: Performed at [facility], [place] [address], [place], [place] [address]  Reticulocytes     Status: Abnormal   Collection Time: 08/13/17  4:38 PM  Result Value Ref Range   Retic Ct Pct 2.6 0.4 - 3.1 %    Comment: RESULTS CONFIRMED BY MANUAL DILUTION   RBC. 1.99 (L) 4.22 - 5.81 MIL/uL   Retic Count, Absolute 51.7 19.0 - 186.0 K/uL    Comment: Performed at [facility], [place] [address], [place], [place] [address]  APTT     Status: Abnormal   Collection Time: 08/13/17  4:38 PM  Result Value Ref Range   aPTT 37 (H) 24 - 36 seconds    Comment:        IF BASELINE aPTT IS ELEVATED, SUGGEST PATIENT RISK ASSESSMENT BE USED TO DETERMINE APPROPRIATE ANTICOAGULANT THERAPY. Performed at [facility], [place] [address], [place], [place] [address]   Type and screen     Status: None (Preliminary result)   Collection Time: 08/13/17  4:39 PM  Result Value Ref Range   ABO/RH(D) A POS    Antibody Screen NEG    Sample  Expiration 08/16/2017    Unit Number [id number]    Blood Component Type RED CELLS,LR    Unit division 00     Status of Unit ISSUED    Transfusion Status OK TO TRANSFUSE    Crossmatch Result      Compatible Performed at [place] [address], [place], [place] [address]    Unit Number [id number]    Blood Component Type RED CELLS,LR    Unit division 00    Status of Unit ALLOCATED    Transfusion Status OK TO TRANSFUSE    Crossmatch Result Compatible    Unit Number [id number]    Blood Component Type RED CELLS,LR    Unit division 00    Status of Unit ALLOCATED    Transfusion Status OK TO TRANSFUSE    Crossmatch Result Compatible    Unit Number [id number]    Blood Component Type RED CELLS,LR    Unit division 00    Status of Unit ALLOCATED    Transfusion Status OK TO TRANSFUSE    Crossmatch Result Compatible   Vitamin B12     Status: Abnormal   Collection Time: 08/13/17  4:39 PM  Result Value Ref Range   Vitamin B-12 1,489 (H) 180 - 914 pg/mL    Comment: (NOTE) This assay is not validated for testing neonatal or myeloproliferative syndrome specimens for Vitamin B12 levels. Performed at [facility], [place] [address] [place], [place] [address]   Iron and TIBC     Status: Abnormal   Collection Time: 08/13/17  4:39 PM  Result Value Ref Range   Iron 48 45 - 182 ug/dL   TIBC 216 (L) 250 - 450 ug/dL   Saturation Ratios 22 17.9 - 39.5 %   UIBC 168 ug/dL    Comment: Performed at [facility], [place] [address] [place], [place] [address]  Ferritin     Status: Abnormal   Collection Time: 08/13/17  4:39 PM  Result Value Ref Range   Ferritin 801 (H) 24 - 336 ng/mL    Comment: Performed at [facility], [place] [address], [place], [place] [address]  ABO/Rh     Status: None (Preliminary result)   Collection Time: 08/13/17  4:39 PM  Result Value Ref Range   ABO/RH(D)      A POS Performed at [facility], [place] [address], [place], [place] [address]   Folate     Status: None   Collection Time: 08/13/17  4:40 PM  Result Value  Ref Range   Folate 6.7 >5.9 ng/mL    Comment: Performed at [place] [address], [place], [place] [address]  POC occult blood, ED     Status: Abnormal   Collection Time: 08/13/17  5:07 PM  Result Value Ref Range   Fecal Occult Bld POSITIVE (A) NEGATIVE  Magnesium     Status: None   Collection Time: 08/13/17  5:42 PM  Result Value Ref Range   Magnesium 1.8 1.7 - 2.4 mg/dL    Comment: Performed at [facility], [place] [address], [place], [place] [address]  Prepare RBC     Status: None   Collection Time: 08/13/17  5:42 PM  Result Value Ref Range   Order Confirmation      ORDER PROCESSED BY BLOOD BANK Performed at [place] [address] [place], [place] [address]   Uric acid     Status: Abnormal  Collection Time: 08/13/17  6:17 PM  Result Value Ref Range   Uric Acid, Serum 11.0 (H) 4.4 - 7.6 mg/dL    Comment: Performed at [facility], [place] [address] [place], [place] [address]  DIC panel     Status: Abnormal   Collection Time: 08/13/17  6:18 PM  Result Value Ref Range   Prothrombin Time 15.4 (H) 11.4 - 15.2 seconds   INR 1.23    aPTT 35 24 - 36 seconds   Fibrinogen 427 210 - 475 mg/dL   D-Dimer, Quant 1.07 (H) 0.00 - 0.50 ug/mL-FEU    Comment: (NOTE) At the manufacturer cut-off of 0.50 ug/mL FEU, this assay has been documented to exclude PE with a sensitivity and negative predictive value of 97 to 99%.  At this time, this assay has not been approved by the FDA to exclude DVT/VTE. Results should be correlated with clinical presentation.    Platelets 38 (L) 150 - 400 K/uL   Smear Review NO SCHISTOCYTES SEEN     Comment: Performed at [facility], [place] [address], [place], [place] [address]  Save smear     Status: None   Collection Time: 08/13/17  6:26 PM  Result Value Ref Range   Smear Review SMEAR STAINED AND AVAILABLE FOR REVIEW     Comment: Performed at [facility], [place] [address], [place], [place] [address]  Lactate dehydrogenase     Status: Abnormal   Collection Time: 08/13/17  6:40 PM  Result Value Ref Range   LDH 1,626 (H) 98 - 192 U/L    Comment: Performed at [facility], [place] [address] [place], [place] [address]   No results found.  Pending Labs Unresulted Labs (From admission, onward)   Start     Ordered   08/14/17 0500  CBC  Tomorrow morning,   R     08/13/17 1858   08/14/17 0175  Basic metabolic panel  Tomorrow morning,   R     08/13/17 1858   08/13/17 1847  BCR-ABL1 Kinase domain mutation analysis  Once,   R     08/13/17 1906   08/13/17 1847  BCR-ABL1, CML/ALL, PCR, QUANT  Once,   R     08/13/17 1906   08/13/17 1638  Pathologist smear review  Once,   R     08/13/17 1638      Vitals/Pain Today's Vitals   08/13/17 2132 08/13/17 2152 08/13/17 2200 08/13/17 2215  BP: 118/67  108/65 107/64  Pulse: 90 90 89 91  Resp: 18 18 (!) 22 19  Temp: 99.4 F (37.4 C) 99.4 F (37.4 C)  98.8 F (37.1 C)  TempSrc: Oral   Oral  SpO2: 99%  98% 98%  Weight:      Height:      PainSc: 0-No pain   0-No pain    Isolation Precautions No active isolations  Medications Medications  0.45 % sodium chloride infusion (has no administration in time range)  acetaminophen (TYLENOL) tablet 650 mg (has no administration in time range)    Or  acetaminophen (TYLENOL) suppository 650 mg (has no administration in time range)  zolpidem (AMBIEN) tablet 5 mg (has no administration in time range)  ondansetron (ZOFRAN) tablet 4 mg (has no administration in time range)    Or  ondansetron (ZOFRAN) injection 4 mg (has no administration in time range)  HYDROcodone-acetaminophen (NORCO) 10-325 MG per tablet 1 tablet (has no administration in time range)  escitalopram (LEXAPRO) tablet 10 mg (has no administration in time range)  pantoprazole (PROTONIX) 80 mg in sodium chloride 0.9 % 100 mL IVPB (0 mg Intravenous Stopped 08/13/17 1824)   prochlorperazine (COMPAZINE) injection 10 mg (10 mg Intravenous Given 08/13/17 1759)  diphenhydrAMINE (BENADRYL) injection 25 mg (25 mg Intravenous Given 08/13/17 1759)    Mobility walks

## 2017-08-13 NOTE — H&P (Signed)
[facility] History and Physical  Nathan Kerr AST:[id number] DOB: 02-16-1986 DOA: 08/13/2017  Referring physician: Dr [facility] PCP: [place], FNP   Chief Complaint: Fatigue, DOE  HPI: Nathan Kerr is a 32 y.o. male with hx of migraine, CML, asthma, allergies and depression presented to ED w/ c/o fatigue and DOE. Labs showed WBC 220,000 and severe anemia.  Asked to see for admission.   Pt says no fevers, no chills , no CP or cough, no n/v/d, no abd pain.  Hx of CML, stopped taking his Tasigna about 5 months ago, going through a separation.  In ED WBC is 220,000 and hemoglobin is 4.4, plt 58kg.  Electrolytes are OK.  Appetite OK, no n/v/d.  FOB +.  Has been doing well in spite of fatiuge.  No diarrhea.      Hx CML dx'd in 2010, had side effects to [facility] and has been successfully treated w/ Tasigna since.  Stopped his medication once before per pt but "not for this long".    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    Home meds list including topamax, maxalt, prn nsaids, lexapro.  Pt not taking of the meds on the home med list.  Lexapro was just prescribed.    ROS  denies CP  no joint pain   no HA  no blurry vision  no rash  no diarrhea  no nausea/ vomiting  no dysuria  no difficulty voiding  no change in urine color    Past Medical History  Past Medical History:  Diagnosis Date  . Allergy   . Asthma    Childhood, not currently active  . Chronic myeloid leukemia in remission ([place])   . CML (chronic myelocytic leukemia) ([place])   . Depression   . Leukemia ([place]) 2010  . Migraine    Past Surgical History  Past Surgical History:  Procedure Laterality Date  . APPENDECTOMY    . BRONCHOSCOPY    . FINGER SURGERY Left    index finger  . LYMPH NODE BIOPSY Left 03/02/2017   Procedure: LYMPH NODE BIOPSY;  Surgeon: Melrose Nakayama, MD;  Location: [facility];  Service: Thoracic;  Laterality: Left;  [name]  VIDEO ASSISTED THORACOSCOPY (VATS)/WEDGE RESECTION Left 03/02/2017   Procedure: LEFT VIDEO ASSISTED THORACOSCOPY (VATS)/LEFT LOWER WEDGE AND LEFT UPPER LOBE RESECTION ;  Surgeon: Melrose Nakayama, MD;  Location: [facility];  Service: Thoracic;  Laterality: Left;  [name] VIDEO BRONCHOSCOPY Bilateral 10/23/2016   Procedure: VIDEO BRONCHOSCOPY WITHOUT FLUORO;  Surgeon: Rigoberto Noel, MD;  [facility];  Service: Cardiopulmonary;  Laterality: Bilateral;   Family History  Family History  Problem Relation Age of Onset  . Sleep apnea Mother   . Migraines Father    Social History  reports that he has never smoked. He has never used smokeless tobacco. He reports that he does not drink alcohol or use drugs. Allergies No Known Allergies Home medications Prior to Admission medications   Medication Sig Start Date End Date Taking? Authorizing Provider  acetaminophen (TYLENOL) 325 MG tablet Take 650 mg by mouth every 6 (six) hours as needed for mild pain or headache.    Yes [provider]  ibuprofen (ADVIL,MOTRIN) 200 MG tablet Take 200 mg by mouth every 6 (six) hours as needed for moderate pain.   Yes [provider]  nilotinib (TASIGNA) 150 MG capsule Take 2 capsules (300 mg total) by mouth  every 12 (twelve) hours. 11/15/16  Yes Wyatt Portela, MD  escitalopram (LEXAPRO) 10 MG tablet Take 1 tablet (10 mg total) by mouth daily. 08/13/17   Hoyt Koch, MD  fluticasone furoate-vilanterol (BREO ELLIPTA) 100-25 MCG/INH AEPB Inhale 1 puff into the lungs daily. Patient not taking: Reported on 08/13/2017 11/15/16   Rigoberto Noel, MD  ibuprofen (ADVIL,MOTRIN) 200 MG tablet Take 2 tablets (400 mg total) by mouth every 8 (eight) hours as needed for headache or moderate pain. Patient not taking: Reported on 08/13/2017 03/05/17   Nani Skillern, PA-C  meloxicam (MOBIC) 15 MG tablet Take 1 tablet (15 mg total) by mouth daily. PATIENT NEEDS OFFICE VISIT FOR ADDITIONAL REFILLS Patient  not taking: Reported on 08/13/2017 07/05/16   Wardell Honour, MD  [facility] HFA 108 [phone] Base) MCG/ACT inhaler Inhale 2 puffs into the lungs every 4 (four) hours as needed for wheezing. 10/23/16   Debbe Odea, MD  rizatriptan (MAXALT-MLT) 10 MG disintegrating tablet Take 1 tablet (10 mg total) by mouth as needed for migraine. May repeat in 2 hours if needed Patient not taking: Reported on 08/13/2017 01/01/17   Penumalli, Earlean Polka, MD  topiramate (TOPAMAX) 50 MG tablet Take 1 tablet (50 mg total) by mouth 2 (two) times daily. Patient not taking: Reported on 08/13/2017 04/02/17   Penni Bombard, MD   Liver Function Tests Recent Labs  Lab 08/13/17 1410 08/13/17 1638  AST 19 21  ALT 12 13*  ALKPHOS 92 86  BILITOT 1.2 1.4*  PROT 7.1 6.5  ALBUMIN 4.0 3.5   No results for input(s): LIPASE, AMYLASE in the last 168 hours. CBC Recent Labs  Lab 08/13/17 1410 08/13/17 1638 08/13/17 1818  WBC 270.9 Repeated and verified X2.* 220.5*  --   NEUTROABS  --  127.9*  --   HGB 4.8 Repeated and verified X2.* 4.8*  --   HCT 15.4 Repeated and verified X2.* 15.0*  --   MCV 73.5* 75.4*  --   PLT 57.0* 38* 38*   Basic Metabolic Panel Recent Labs  Lab 08/13/17 1410 08/13/17 1638  NA 137 140  K 3.8 3.2*  CL 103 106  CO2 26 24  GLUCOSE 90 93  BUN 11 12  CREATININE 1.12 1.15  CALCIUM 9.2 9.0     Vitals:   08/13/17 1624 08/13/17 1700 08/13/17 1800 08/13/17 1808  BP: 99/64 107/72 112/71   Pulse: (!) 101 97 (!) 105   Resp: 18     Temp:    100 F (37.8 C)  TempSrc:    Oral  SpO2: 97% 100% 98%   Weight:      Height:       Exam: Gen alert, pale WM, no distress, fairly weak but ambulates w/o difficulty No rash, cyanosis or gangrene Sclera anicteric, throat clear and moist  No jvd or bruits Chest clear bilat RRR no MRG, hyperdynamic precordium Abd soft ntnd no mass or ascites +bs GU normal male MS no joint effusions or deformity  Ext no LE edema / no wounds or ulcers Neuro is alert, Ox 3  , nf    WBC 220K  Hb 4.8  Plts 38- 57   Assessment: 1)  CML - under Rx since 2010. Now in relapse due to not taking medications x 5 mos.  WBC 220K, oncology is aware and will await their recommendations.   2)  Anemia - Hb 4.4, probably due to #1, defer to ONC rec's  3)  Thrombocytopenia -  prob due to #1. No active bleeding.   4)  Depression - will continue lexapro 10 /d, just prescribed yesterday     Plan - as above       [place] D [facility] Pager [phone]   If 7PM-7AM, please contact night-coverage [email or web address] Password [facility] 08/13/2017, 6:37 PM

## 2017-08-13 NOTE — ED Notes (Signed)
MD [facility] states to hold off on blood transfusion until further notice.

## 2017-08-13 NOTE — ED Notes (Signed)
Date and time results received: 08/13/17 17:26 (use smartphrase ".now" to insert current time)  Test: HGB  Critical Value: 4.8   Name of Provider Notified: Monico Blitz, PA via telephone. Announced over the radio for the primary care nurse of lab results.   Orders Received? Or Actions Taken?: Will continue to monitor and await for new orders.

## 2017-08-13 NOTE — Assessment & Plan Note (Addendum)
Concern for anemia with CML and off his chronic therapy. Checking BNP and d-dimer as well to rule out alternative etiology. This is severe and life limiting at this time.

## 2017-08-13 NOTE — ED Notes (Signed)
Date and time results received: 08/13/17 17:26 (use smartphrase ".now" to insert current time)  Test: WBC Critical Value: 220.5   Name of Provider Notified: Monico Blitz, PA via telephone. Announced over the radio for the primary nurse.   Orders Received? Or Actions Taken?: Will continue to monitor and will await for new orders.

## 2017-08-13 NOTE — ED Notes (Signed)
Bed: AC29 Expected date:  Expected time:  Means of arrival:  Comments: EMS-fall-head lac

## 2017-08-14 ENCOUNTER — Inpatient Hospital Stay (HOSPITAL_COMMUNITY): Payer: 59

## 2017-08-14 ENCOUNTER — Other Ambulatory Visit: Payer: Self-pay

## 2017-08-14 LAB — CBC
HEMATOCRIT: 23.5 % — AB (ref 39.0–52.0)
Hemoglobin: 8.1 g/dL — ABNORMAL LOW (ref 13.0–17.0)
MCH: 27 pg (ref 26.0–34.0)
MCHC: 34.5 g/dL (ref 30.0–36.0)
MCV: 78.3 fL (ref 78.0–100.0)
Platelets: 42 10*3/uL — ABNORMAL LOW (ref 150–400)
RBC: 3 MIL/uL — ABNORMAL LOW (ref 4.22–5.81)
RDW: 19 % — ABNORMAL HIGH (ref 11.5–15.5)
WBC: 249.7 10*3/uL (ref 4.0–10.5)

## 2017-08-14 LAB — BASIC METABOLIC PANEL
Anion gap: 4 — ABNORMAL LOW (ref 5–15)
BUN: 9 mg/dL (ref 6–20)
CHLORIDE: 111 mmol/L (ref 101–111)
CO2: 24 mmol/L (ref 22–32)
CREATININE: 1.1 mg/dL (ref 0.61–1.24)
Calcium: 8.5 mg/dL — ABNORMAL LOW (ref 8.9–10.3)
GFR calc non Af Amer: >60 mL/min (ref >60)
Glucose, Bld: 91 mg/dL (ref 65–99)
Potassium: 3.3 mmol/L — ABNORMAL LOW (ref 3.5–5.1)
Sodium: 139 mmol/L (ref 135–145)

## 2017-08-14 LAB — HEMOGLOBIN A1C
HEMOGLOBIN A1C: 5.5 %{Hb} (ref <5.7)
Mean Plasma Glucose: 111 (calc)
eAG (mmol/L): 6.2 (calc)

## 2017-08-14 LAB — ABO/RH: ABO/RH(D): A POS

## 2017-08-14 LAB — DIRECT ANTIGLOBULIN TEST (NOT AT ARMC)
DAT, COMPLEMENT: NEGATIVE
DAT, IGG: NEGATIVE

## 2017-08-14 LAB — PATHOLOGIST SMEAR REVIEW

## 2017-08-14 MED ORDER — IOPAMIDOL (ISOVUE-300) INJECTION 61%: 15.0000 mL | Freq: Once | INTRAVENOUS | Status: AC | PRN

## 2017-08-14 MED ORDER — NILOTINIB HCL 150 MG PO CAPS
300.0000 mg | ORAL_CAPSULE | Freq: Two times a day (BID) | ORAL | Status: DC
  Administered 2017-08-15 – 2017-08-16 (×2): 300 mg via ORAL

## 2017-08-14 MED ORDER — IOPAMIDOL (ISOVUE-300) INJECTION 61%
INTRAVENOUS | Status: AC
  Administered 2017-08-14: 15 mL
  Filled 2017-08-14: qty 30

## 2017-08-14 MED ORDER — ENSURE ENLIVE PO LIQD: 237.0000 mL | Freq: Two times a day (BID) | ORAL | Status: DC

## 2017-08-14 NOTE — Progress Notes (Signed)
Initial Nutrition Assessment  DOCUMENTATION CODES:   Not applicable  INTERVENTION:   Continue Ensure Enlive po BID, each supplement provides 350 kcal and 20 grams of protein  NUTRITION DIAGNOSIS:   Increased nutrient needs related to cancer and cancer related treatments as evidenced by estimated needs.  GOAL:   Patient will meet greater than or equal to 90% of their needs  MONITOR:   PO intake, Supplement acceptance, Weight trends, Labs  REASON FOR ASSESSMENT:   Malnutrition Screening Tool    ASSESSMENT:   Patient with PMH significant for CML (diagnosed 2010), allergies, and asthma. Presents this admission with relapse CML and anemia.    Spoke with pt at bedside. Pt reluctant to provide information. Reports having a decreased appetite for months due to ongoing depression. States when he was married he began gaining weight but is now recently separated, causing a decrease in PO intake. States he typically has 1-2 meals each day of "different things" depending on where he travels for his job. Unable to provide specific dietary recall. Pt does not use supplementation at home. Pt had bites of eggs and french toast for breakfast without complication. Meal completions charted as 0% for the last four meals. Will continue with current supplementation.   Pt endorses a UBW of 170 lb. Records indicate pt weighed 203 lb 03/06/17 and 182 lb this admission (10.3% wt loss in 6 months, significant for time frame). Nutrition-Focused physical exam completed. Suspect some form of malnutrition but unable to diagnosis with lack of dietary recall information.   Medications reviewed and include: 0.45 NaCl @ 100 ml/hr Labs reviewed:  K 3.3 (L)   NUTRITION - FOCUSED PHYSICAL EXAM:    Most Recent Value  Orbital Region  No depletion  Upper Arm Region  No depletion  Thoracic and Lumbar Region  Unable to assess  Buccal Region  No depletion  Temple Region  No depletion  Clavicle Bone Region  No  depletion  Clavicle and Acromion Bone Region  No depletion  Scapular Bone Region  Unable to assess  Dorsal Hand  No depletion  Patellar Region  Unable to assess  Anterior Thigh Region  Unable to assess  Posterior Calf Region  Unable to assess  Edema (RD Assessment)  Unable to assess     Diet Order:  Diet regular Room service appropriate? Yes; Fluid consistency: Thin  EDUCATION NEEDS:   Not appropriate for education at this time  Skin:  Skin Assessment: Reviewed RN Assessment  Last BM:  08/13/2017  Height:   Ht Readings from Last 1 Encounters:  08/13/17 5\' 10"  (1.778 m)    Weight:   Wt Readings from Last 1 Encounters:  08/14/17 182 lb (82.6 kg)    Ideal Body Weight:  75.5 kg  BMI:  Body mass index is 26.11 kg/m.  Estimated Nutritional Needs:   Kcal:  2200-2400 kcal   Protein:  110-120 g  Fluid:  >2.2 L/day    Mariana Single RD, LDN Clinical Nutrition Pager # - [phone]

## 2017-08-14 NOTE — Progress Notes (Signed)
[facility] Progress Note  Subjective: no c/o this am, getting 3rd of 4 prbc's.  No visual chg, no HA or confusion.   Vitals:   08/14/17 0535 08/14/17 0623 08/14/17 0920 08/14/17 1053  BP: (!) 90/55 114/69 105/68 115/71  Pulse: 83  80 80  Resp: 16  14 14   Temp: 98.6 F (37 C)  99 F (37.2 C) 99 F (37.2 C)  TempSrc: Oral  Oral Oral  SpO2: 98%  99% 98%  Weight:      Height:        Inpatient medications: . allopurinol  100 mg Oral BID  . escitalopram  10 mg Oral QHS  . feeding supplement (ENSURE ENLIVE)  237 mL Oral BID BM  . nilotinib  300 mg Oral Q12H   . sodium chloride 75 mL/hr at 08/13/17 2338   acetaminophen **OR** acetaminophen, HYDROcodone-acetaminophen, ondansetron **OR** ondansetron (ZOFRAN) IV, zolpidem  Exam: Gen alert, pale WM, no distress No rash, cyanosis or gangrene Sclera anicteric, throat clear and moist  No jvd or bruits Chest clear bilat RRR no MRG, hyperdynamic precordium Abd soft ntnd no mass or ascites +bs GU normal male MS no joint effusions or deformity  Ext no LE edema, no wounds or ulcers Neuro is alert, Ox 3 , nf     Brief Summary: 32 y.o. male with hx of migraine, CML, asthma, allergies and depression presented to ED w/ c/o fatigue and DOE. Labs showed WBC 220,000 and severe anemia.   Pt denied fevers, no chills , no CP or cough, no n/v/d, no abd pain.  In ED WBC is 220,000 and hemoglobin is 4.4, plt 58kg.  Electrolytes were OK.  Appetite OK, no n/v/d.  FOB +.  Has been doing well in spite of fatigue.  No diarrhea.  History of CML dx'd in 2010; had side effects to [facility] and had been successfully treated w/ Tasigna since.  Stopped taking his Tasigna about 5 months ago, going through a separation/ divorce.  Stopped his medication once before per pt but "not for this long".   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  Home meds list including topamax, maxalt, prn nsaids, lexapro, pt says  he has not been taking any of the meds on the home med list.  Lexapro was just prescribed the day prior to admission.       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2 1.4*  PROT 7.1 6.5  ALBUMIN 4.0 3.5   Recent Labs  Lab 08/13/17 1410 08/13/17 1638 08/13/17 1818  WBC 270.9 Repeated and verified X2.* 220.5*  --   NEUTROABS  --  127.9*  --   HGB 4.8 Repeated and verified X2.* 4.8*  --   HCT 15.4 Repeated and verified X2.* 15.0*  --   MCV 73.5* 75.4*  --   PLT 57.0* 38* 38*   Iron/TIBC/Ferritin/ %Sat    Component Value Date/Time   IRON 48 08/13/2017 1639   TIBC 216 (L) 08/13/2017 1639   FERRITIN 801 (H) 08/13/2017 1639   IRONPCTSAT 22 08/13/2017 1639

## 2017-08-14 NOTE — Progress Notes (Signed)
Peripheral smear by morphology did show increased blast count.  Flow cytometry will be obtained for further quantification.  These findings were discussed with Dr. Gari Crown the reviewing pathologist.    Depending on the morphology the blast (lymphoid or myeloid) and the percentage, he might require a bone marrow biopsy and further treatment beyond oral tyrosine kinase inhibitor.  Once these results are confirmed, I will discussed with the patient the need for evaluation at a tertiary care center for continued evaluation and treatment.

## 2017-08-14 NOTE — Progress Notes (Signed)
Pt arrived to unit with first unit of blood transfusing, initiated in ED.

## 2017-08-14 NOTE — Progress Notes (Signed)
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  He was hospitalized on 08/14/2017 with potentially accelerated phase due to lack of compliance.   Prior Therapy:  1. Patient treated with Gleevec 400 mg daily between April of 2010 until September of 2010. 2.     Patient treated with Gleevec 200 mg daily due to severe thrombocytopenia and subsequently switched to Tasigna for lack of efficacy and poor tolerance.   Current therapy: He is on Tasigna 300 mg tablet b.i.d. total 600 mg daily, started in August of 2011.  He had a complete hematological remission and near molecular remission at this time. He presented with a relapse in November of 2014 with elevated white cell count close to 300,000 range after he stopped taking his medication due to poor compliance. He was restarted in November of 2014 had been in remission since that time.  He was admitted on 08/13/2017 after presenting with a hemoglobin of 4.8, platelet count of 57 and a white cell count of 270.  His differential showed left shifted with myelocytes and metamyelocytes although there are no increased blasts. He is complaining of symptoms affect fatigue  Dyspnea on exertion without any fevers, chills or sweats.  He was noted to have elevated LDH and anemia workup revealed no vitamin deficiency.  He is currently receiving packed red cell transfusion.  He reports he has been noncompliant with his medication and has not taken it for close to 6 months.  He has been having marital problems and struggling with depression but claims he resumed taking to [employer] in the last week or so.    Objective:  Vital signs in last 24 hours: Temp:  [98.6 F (37 C)-100 F (37.8 C)] 98.6 F (37 C) (04/09 0535) Pulse Rate:  [83-105] 83 (04/09 0535) Resp:  [16-22] 16 (04/09 0535) BP: (90-121)/(51-74) 114/69 (04/09 0623) SpO2:  [96 %-100 %] 98 % (04/09  0535) Weight:  [182 lb (82.6 kg)-190 lb (86.2 kg)] 182 lb (82.6 kg) (04/09 0500) Weight change:  Last BM Date: 08/13/17  Intake/Output from previous day: 04/08 0701 - 04/09 0700 In: 2093.1 [P.O.:120; I.V.:742.1; Blood:1131; IV Piggyback:100] Out: 400 [Urine:400] Alert, alert appears in no active distress today. Mouth: No mouth ulcers or thrush. Resp: Clear without any wheezes or dullness to percussion. Cardio: regular rate and rhythm, S1, S2 normal, no murmur, click, rub or gallop GI: Soft, nontender with good bowel sounds.  Could not palpate the splenomegaly. Extremities: extremities normal, atraumatic, no cyanosis or edema  Skin: No rashes or lesions. Neurological: No motor or sensory deficits.    Lab Results: Recent Labs    08/13/17 1410 08/13/17 1638 08/13/17 1818  WBC 270.9 Repeated and verified X2.* 220.5*  --   HGB 4.8 Repeated and verified X2.* 4.8*  --   HCT 15.4 Repeated and verified X2.* 15.0*  --   PLT 57.0* 38* 38*    BMET Recent Labs    08/13/17 1410 08/13/17 1638  NA 137 140  K 3.8 3.2*  CL 103 106  CO2 26 24  GLUCOSE 90 93  BUN 11 12  CREATININE 1.12 1.15  CALCIUM 9.2 9.0    Studies/Results: No results found.  Medications: I have reviewed the patient's current medications.  Assessment/Plan:  32 year old gentleman with the following issues:  1. CML he was initially diagnosed in 2010 in chronic phase.  He presented with elevated white cell count and thrombocytopenia  and severe anemia.  His peripheral blood does not response but his thrombocytopenia does meet the accelerated phase criteria.  The etiology of this presentation is due to medication compliance.  The plan is to await the official pathology review of his smear.  Restarting Tasigna 300 mg tablet b.i.d. total 600 mg daily is the initial step.  He reports he has this medication with him and he needs to continue it while he is hospitalized.  A different agent may be used (Ponatinib) if he  is developing resistance.  It would be difficult to assume that given his lack of compliance.  I anticipate improvement of his condition after the restarting of this medication.  2.  Anemia: Related to CML.  No active bleeding noted.  I agree with packed red cell transfusion as you are doing.  3. Left lower lobe mass: Status post surgical resection in October 2019 which showed benign findings and chronic granulomatous inflammation.  4.  Depression: Lexapro was restarted.  I recommend psychiatric evaluation and possible referral for therapy.  His CML complications related to medication adherence precipitated by depression.  5.  Disposition: His care can be continued as an outpatient once his transfusion is done and his hemoglobin is stable in the next 24 hours.  25  minutes was spent with the patient face-to-face today.  More than 50% of time was dedicated to patient counseling, education and coordination of the his multifaceted care.    LOS: 1 day   Zola Button 08/14/2017, 7:56 AM

## 2017-08-15 ENCOUNTER — Inpatient Hospital Stay (HOSPITAL_COMMUNITY): Payer: 59

## 2017-08-15 DIAGNOSIS — D649 Anemia, unspecified: Secondary | ICD-10-CM

## 2017-08-15 DIAGNOSIS — D696 Thrombocytopenia, unspecified: Secondary | ICD-10-CM

## 2017-08-15 LAB — BPAM RBC
BLOOD PRODUCT EXPIRATION DATE: 201904262359
BLOOD PRODUCT EXPIRATION DATE: 201904262359
BLOOD PRODUCT EXPIRATION DATE: 201904282359
Blood Product Expiration Date: 201904262359
ISSUE DATE / TIME: 201904082141
ISSUE DATE / TIME: 201904090103
ISSUE DATE / TIME: 201904090515
ISSUE DATE / TIME: 201904091047
UNIT TYPE AND RH: 6200
Unit Type and Rh: 6200
Unit Type and Rh: 6200
Unit Type and Rh: 6200

## 2017-08-15 LAB — TYPE AND SCREEN
ABO/RH(D): A POS
Antibody Screen: NEGATIVE
UNIT DIVISION: 0
UNIT DIVISION: 0
UNIT DIVISION: 0
Unit division: 0

## 2017-08-15 LAB — CBC
HEMATOCRIT: 22.1 % — AB (ref 39.0–52.0)
HEMOGLOBIN: 7.5 g/dL — AB (ref 13.0–17.0)
MCH: 26.3 pg (ref 26.0–34.0)
MCHC: 33.9 g/dL (ref 30.0–36.0)
MCV: 77.5 fL — ABNORMAL LOW (ref 78.0–100.0)
Platelets: 36 10*3/uL — ABNORMAL LOW (ref 150–400)
RBC: 2.85 MIL/uL — ABNORMAL LOW (ref 4.22–5.81)
RDW: 19.2 % — AB (ref 11.5–15.5)
WBC: 220.2 10*3/uL — AB (ref 4.0–10.5)

## 2017-08-15 LAB — COMPREHENSIVE METABOLIC PANEL
ALBUMIN: 3.3 g/dL — AB (ref 3.5–5.0)
ALK PHOS: 93 U/L (ref 38–126)
ALT: 15 U/L — ABNORMAL LOW (ref 17–63)
AST: 20 U/L (ref 15–41)
Anion gap: 11 (ref 5–15)
BILIRUBIN TOTAL: 1.9 mg/dL — AB (ref 0.3–1.2)
BUN: 11 mg/dL (ref 6–20)
CO2: 23 mmol/L (ref 22–32)
Calcium: 8.4 mg/dL — ABNORMAL LOW (ref 8.9–10.3)
Chloride: 108 mmol/L (ref 101–111)
Creatinine, Ser: 1.04 mg/dL (ref 0.61–1.24)
GFR calc Af Amer: >60 mL/min (ref >60)
GFR calc non Af Amer: >60 mL/min (ref >60)
Glucose, Bld: 96 mg/dL (ref 65–99)
POTASSIUM: 3.2 mmol/L — AB (ref 3.5–5.1)
Sodium: 142 mmol/L (ref 135–145)
TOTAL PROTEIN: 5.9 g/dL — AB (ref 6.5–8.1)

## 2017-08-15 LAB — HAPTOGLOBIN: HAPTOGLOBIN: 42 mg/dL (ref 34–200)

## 2017-08-15 LAB — LACTATE DEHYDROGENASE: LDH: 1736 U/L — ABNORMAL HIGH (ref 98–192)

## 2017-08-15 LAB — PHOSPHORUS: Phosphorus: 4.6 mg/dL (ref 2.5–4.6)

## 2017-08-15 LAB — URIC ACID: Uric Acid, Serum: 9.7 mg/dL — ABNORMAL HIGH (ref 4.4–7.6)

## 2017-08-15 MED ORDER — HYDROXYUREA 500 MG PO CAPS
1000.0000 mg | ORAL_CAPSULE | Freq: Every day | ORAL | Status: DC
  Administered 2017-08-15: 1000 mg via ORAL
  Filled 2017-08-15: qty 2

## 2017-08-15 MED ORDER — FENTANYL CITRATE (PF) 100 MCG/2ML IJ SOLN
INTRAMUSCULAR | Status: AC
  Filled 2017-08-15: qty 4

## 2017-08-15 MED ORDER — POTASSIUM CHLORIDE CRYS ER 20 MEQ PO TBCR
40.0000 meq | EXTENDED_RELEASE_TABLET | Freq: Once | ORAL | Status: AC
  Administered 2017-08-15: 40 meq via ORAL
  Filled 2017-08-15: qty 2

## 2017-08-15 MED ORDER — FENTANYL CITRATE (PF) 100 MCG/2ML IJ SOLN
INTRAMUSCULAR | Status: AC | PRN
  Administered 2017-08-15 (×3): 50 ug via INTRAVENOUS

## 2017-08-15 MED ORDER — HYDROXYUREA 500 MG PO CAPS
500.0000 mg | ORAL_CAPSULE | Freq: Every day | ORAL | Status: DC
  Administered 2017-08-15: 500 mg via ORAL
  Filled 2017-08-15: qty 1

## 2017-08-15 MED ORDER — OXYMETAZOLINE HCL 0.05 % NA SOLN
1.0000 | Freq: Two times a day (BID) | NASAL | Status: DC | PRN
  Filled 2017-08-15: qty 15

## 2017-08-15 MED ORDER — HYDROXYUREA 500 MG PO CAPS: 500.0000 mg | ORAL_CAPSULE | Freq: Every day | ORAL | Status: DC

## 2017-08-15 MED ORDER — LIDOCAINE HCL 1 % IJ SOLN
INTRAMUSCULAR | Status: AC | PRN
  Administered 2017-08-15 (×2): 10 mL via INTRADERMAL

## 2017-08-15 NOTE — Progress Notes (Signed)
PROGRESS NOTE        PATIENT DETAILS Name: Nathan Kerr Age: 32 y.o. Sex: male Date of Birth: Feb 28, 1986 Admit Date: 08/13/2017 Admitting Physician Roney Jaffe, MD HUO:HFGBMS, Ples Specter, FNP  Brief Narrative: Patient is a 32 y.o. male with history of CML-noncompliant with medications -admitted with fatigue and dyspnea on exertion-found to have profoundly elevated WBC count severe anemia along with thrombocytopenia.  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  Plans are to continue Hydrea and Nilotinib-obtain if bone marrow biopsy, once his CBC is stabilized, patient will be referred in the outpatient setting by hematology to [facility] for eventual bone marrow transplantation.  Subjective: Minimal amount of epistaxis this morning.  But feels much better.  Claims he is not able to walk without any shortness of breath   Assessment/Plan: Blast phase of CML: Unfortunately noncompliant with medications-plans are to continue Hydrea/Nilotinib-bone marrow biopsy are pending-once his counts stabilized further, patient will be referred in the outpatient setting to [facility] for eventual stem cell transplantation.  Appreciate hematology input.  Remains on IV fluid, allopurinol to prevent tumor lysis.  Anemia: Secondary to above-hemoglobin improved with 4 units of PRBC.  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  Recurrent epistaxis: This is a long-standing history-it predates his diagnosis of CML-for now appears to be very minimal-continue supportive care-use oxygen metolazone nasal spray if needed.  If it worsens, we will place a Rhino Rocket obtain a ENT consultation  Hyperuricemia: On allopurinol-if renal function worsens-or  if uric acid level worsens-may need to start rasburicase.  Hypokalemia: Replete and recheck  DVT Prophylaxis: SCD's  Code Status: Full code  Family Communication: None at bedside  Disposition Plan: Remain inpatient-requires a few more days of hospitalization.  Antimicrobial agents: Anti-infectives (From admission, onward)   None      Procedures: 4/10>> bone marrow biopsy  CONSULTS:  hematology/oncology  Time spent: 25- minutes-Greater than 50% of this time was spent in counseling, explanation of diagnosis, planning of further management, and coordination of care.  MEDICATIONS: Scheduled Meds: . allopurinol  100 mg Oral BID  . escitalopram  10 mg Oral QHS  . feeding supplement (ENSURE ENLIVE)  237 mL Oral BID BM  . fentaNYL      . hydroxyurea  1,000 mg Oral Daily   And  . hydroxyurea  500 mg Oral Q supper  . nilotinib  300 mg Oral Q12H   Continuous Infusions: . sodium chloride 100 mL/hr at 08/14/17 2302   PRN Meds:.acetaminophen **OR** acetaminophen, HYDROcodone-acetaminophen, ondansetron **OR** ondansetron (ZOFRAN) IV, oxymetazoline, zolpidem   PHYSICAL EXAM: Vital signs: Vitals:   08/15/17 1011 08/15/17 1015 08/15/17 1030 08/15/17 1042  BP: 120/83 117/83 (!) 123/92 (!) 137/93  Pulse: 86 83 (!) 101 95  Resp: 18 18 18 16   Temp:      TempSrc:      SpO2: 98% 97% 99% 96%  Weight:      Height:       Filed Weights   08/13/17 2301 08/14/17 0500 08/15/17 0435  Weight: 82.8 kg (182 lb 9.6 oz) 82.6 kg (182 lb) 85.3 kg (188 lb 1.6 oz)   Body mass index is 26.99 kg/m.  General appearance :Awake, alert, not in any distress. Speech Clear. Not toxic Looking Eyes:, pupils equally reactive to light and accomodation,no scleral icterus.Pink conjunctiva HEENT: Atraumatic and Normocephalic Neck: supple, no JVD.  Resp:Good air entry bilaterally, no added sounds  CVS: S1 S2 regular, no murmurs.  GI: Bowel sounds present, Non tender and not distended with no  gaurding, rigidity or rebound.No organomegaly Extremities: B/L Lower Ext shows no edema, both legs are warm to touch Neurology:  speech clear,Non focal, sensation is grossly intact. Psychiatric: Normal judgment and insight. Alert and oriented x 3. Normal mood. Musculoskeletal:No digital cyanosis Skin:No Rash, warm and dry Wounds:N/A  I have personally reviewed following labs and imaging studies  LABORATORY DATA: CBC: Recent Labs  Lab 08/13/17 1410 08/13/17 1638 08/13/17 1818 08/14/17 1855 08/15/17 0454  WBC 270.9 Repeated and verified X2.* 220.5*  --  249.7* 220.2*  NEUTROABS  --  127.9*  --   --   --   HGB 4.8 Repeated and verified X2.* 4.8*  --  8.1* 7.5*  HCT 15.4 Repeated and verified X2.* 15.0*  --  23.5* 22.1*  MCV 73.5* 75.4*  --  78.3 77.5*  PLT 57.0* 38* 38* 42* 36*    Basic Metabolic Panel: Recent Labs  Lab 08/13/17 1410 08/13/17 1638 08/13/17 1742 08/14/17 0849 08/15/17 0454  NA 137 140  --  139 142  K 3.8 3.2*  --  3.3* 3.2*  CL 103 106  --  111 108  CO2 26 24  --  24 23  GLUCOSE 90 93  --  91 96  BUN 11 12  --  9 11  CREATININE 1.12 1.15  --  1.10 1.04  CALCIUM 9.2 9.0  --  8.5* 8.4*  MG  --   --  1.8  --   --   PHOS  --   --   --   --  4.6    GFR: Estimated Creatinine Clearance: 104.3 mL/min (by C-G formula based on SCr of 1.04 mg/dL).  Liver Function Tests: Recent Labs  Lab 08/13/17 1410 08/13/17 1638 08/15/17 0454  AST 19 21 20   ALT 12 13* 15*  ALKPHOS 92 86 93  BILITOT 1.2 1.4* 1.9*  PROT 7.1 6.5 5.9*  ALBUMIN 4.0 3.5 3.3*   No results for input(s): LIPASE, AMYLASE in the last 168 hours. No results for input(s): AMMONIA in the last 168 hours.  Coagulation Profile: Recent Labs  Lab 08/13/17 1638 08/13/17 1818  INR 1.29 1.23    Cardiac Enzymes: No results for input(s): CKTOTAL, CKMB, CKMBINDEX, TROPONINI in the last 168 hours.  BNP (last 3 results) Recent Labs    08/13/17 1410  PROBNP 79.0    HbA1C: Recent Labs     08/13/17 1532  HGBA1C 5.5    CBG: No results for input(s): GLUCAP in the last 168 hours.  Lipid Profile: No results for input(s): CHOL, HDL, LDLCALC, TRIG, CHOLHDL, LDLDIRECT in the last 72 hours.  Thyroid Function Tests: Recent Labs    08/13/17 1410  TSH 2.78  FREET4 0.95    Anemia Panel: Recent Labs    08/13/17 1410 08/13/17 1638 08/13/17 1639 08/13/17 1640  VITAMINB12 >1500*  --  1,489*  --   FOLATE  --   --   --  6.7  FERRITIN 843.3*  --  801*  --   TIBC  --   --  216*  --   IRON  --   --  48  --  RETICCTPCT  --  2.6  --   --     Urine analysis:    Component Value Date/Time   COLORURINE YELLOW 10/21/2016 [place] 10/21/2016 1217   LABSPEC 1.020 10/21/2016 1217   PHURINE 5.5 10/21/2016 1217   GLUCOSEU NEGATIVE 10/21/2016 1217   HGBUR NEGATIVE 10/21/2016 1217   BILIRUBINUR NEGATIVE 10/21/2016 1217   KETONESUR NEGATIVE 10/21/2016 1217   PROTEINUR NEGATIVE 10/21/2016 1217   UROBILINOGEN 0.2 08/12/2008 1818   NITRITE NEGATIVE 10/21/2016 1217   LEUKOCYTESUR NEGATIVE 10/21/2016 1217    Sepsis Labs: Lactic Acid, Venous No results found for: LATICACIDVEN  MICROBIOLOGY: No results found for this or any previous visit (from the past 240 hour(s)).  RADIOLOGY STUDIES/RESULTS: Ct Abdomen Pelvis Wo Contrast  Result Date: 08/14/2017 CLINICAL DATA:  Evaluate for splenomegaly, history of CML in relapse EXAM: CT ABDOMEN AND PELVIS WITHOUT CONTRAST TECHNIQUE: Multidetector CT imaging of the abdomen and pelvis was performed following the standard protocol without IV contrast. COMPARISON:  CT abdomen and pelvis of 08/12/2008 FINDINGS: Lower chest: The lung bases clear. Mild atelectasis and scarring with adjacent sutures is noted within the left lower lobe posteriorly. The heart is mildly enlarged. Hepatobiliary: The liver is unremarkable in the unenhanced state. No calcified gallstones are seen. Pancreas: The pancreas is unremarkable on this unenhanced  study. The pancreatic duct does not appear to be dilated. Spleen: Significant enlargement of the spleen is noted. However the spleen does not measure as large as it measured on the CT of 2010. On axial image 46 series 2 the spleen measures 10.3 by 21.5 cm compared to 26.3 cm in longest dimension on the prior CT. No intra splenic lesion is seen. The spleen does extend below the left iliac crest into the left upper pelvis on coronal images compared to the prior measurements, the spleen currently measures 27.8 x 13.7 cm the prior measurements of 34.9 x 18.8 cm. Adrenals/Urinary Tract: The adrenal glands are unremarkable. The left kidney is somewhat compressed posterolateral posterior medially by the significantly enlarged spleen, similar to the prior study. However no renal calculi are seen and there is no evidence of hydronephrosis. The ureters are difficult to visualize but do not appear to be dilated, and the urinary bladder is not well distended but appears unremarkable. Stomach/Bowel: The stomach is post medially by the prominent spleen and the stomach is decompressed. No small bowel distention is seen. Colon is also displaced medially by the enlarged spleen but no colonic lesion is seen. The terminal ileum is unremarkable the appendix is not well seen but no inflammatory process is noted. Vascular/Lymphatic: The abdominal aorta is normal in caliber. No definite adenopathy is seen. Reproductive: The prostate is normal in size. Other: No abdominal wall hernia is noted. Musculoskeletal: The lumbar vertebrae are in normal alignment with normal intervertebral disc spaces. The SI joints are corticated. IMPRESSION: 1. Significant splenomegaly, although the spleen measures somewhat smaller than on the CT from 2010 as noted above. 2. No definite abdominal or pelvic adenopathy is noted. Electronically Signed   By: Ivar Drape M.D.   On: 08/14/2017 17:05   Ct Biopsy  Result Date: 08/15/2017 CLINICAL DATA:  History of  chronic myeloid leukemia with probable relapse and abnormal peripheral blood smear demonstrating increased blasts. EXAM: CT GUIDED BONE MARROW ASPIRATION AND BIOPSY ANESTHESIA/SEDATION: Fentanyl 150 mcg IV Full conscious sedation could not be administered as the patient had eaten some food prior to the procedure. PROCEDURE: The procedure risks, benefits, and alternatives  were explained to the patient. Questions regarding the procedure were encouraged and answered. The patient understands and consents to the procedure. A time out was performed prior to initiating the procedure. The right gluteal region was prepped with chlorhexidine. Sterile gown and sterile gloves were used for the procedure. Local anesthesia was provided with 1% Lidocaine. Under CT guidance, an 11 gauge On Control bone cutting needle was advanced from a posterior approach into the right iliac bone. Needle positioning was confirmed with CT. Initial non heparinized and heparinized aspirate samples were obtained of bone marrow. Core biopsy was performed via the On Control drill needle. COMPLICATIONS: None FINDINGS: Inspection of initial aspirate did reveal visible particles. Intact core biopsy sample was obtained. IMPRESSION: CT guided bone marrow biopsy of right posterior iliac bone with both aspirate and core samples obtained. Electronically Signed   By: Aletta Edouard M.D.   On: 08/15/2017 11:41   Ct Bone Marrow Biopsy & Aspiration  Result Date: 08/15/2017 CLINICAL DATA:  History of chronic myeloid leukemia with probable relapse and abnormal peripheral blood smear demonstrating increased blasts. EXAM: CT GUIDED BONE MARROW ASPIRATION AND BIOPSY ANESTHESIA/SEDATION: Fentanyl 150 mcg IV Full conscious sedation could not be administered as the patient had eaten some food prior to the procedure. PROCEDURE: The procedure risks, benefits, and alternatives were explained to the patient. Questions regarding the procedure were encouraged and answered.  The patient understands and consents to the procedure. A time out was performed prior to initiating the procedure. The right gluteal region was prepped with chlorhexidine. Sterile gown and sterile gloves were used for the procedure. Local anesthesia was provided with 1% Lidocaine. Under CT guidance, an 11 gauge On Control bone cutting needle was advanced from a posterior approach into the right iliac bone. Needle positioning was confirmed with CT. Initial non heparinized and heparinized aspirate samples were obtained of bone marrow. Core biopsy was performed via the On Control drill needle. COMPLICATIONS: None FINDINGS: Inspection of initial aspirate did reveal visible particles. Intact core biopsy sample was obtained. IMPRESSION: CT guided bone marrow biopsy of right posterior iliac bone with both aspirate and core samples obtained. Electronically Signed   By: Aletta Edouard M.D.   On: 08/15/2017 11:41     LOS: 2 days   Oren Binet, MD  [facility] Pager:[phone]  If 7PM-7AM, please contact night-coverage [email or web address] Password [email or web address] 08/15/2017, 1:32 PM

## 2017-08-15 NOTE — Progress Notes (Signed)
Referring Physician(s): OVZCHY,I  Supervising Physician: Aletta Edouard  Patient Status:  [facility] - In-pt  Chief Complaint:  Chronic myelogenous leukemia  Subjective: Patient familiar to IR service from prior bone marrow biopsy in 2010.  He has a history of CML with poor compliance with treatment regimen and presents now with probable recurrence with white cell count over 200,000 and increased blast count. Request received for repeat CT-guided bone marrow biopsy for further evaluation.  He denies fever, headache, chest pain, dyspnea, cough, worsening abdominal pain, nausea, vomiting.  He has had some nosebleeds from the right nostril.  Past Medical History:  Diagnosis Date  . Allergy   . Asthma    Childhood, not currently active  . Chronic myeloid leukemia in remission ([place])   . CML (chronic myelocytic leukemia) ([place])   . Depression   . Leukemia ([place]) 2010  . Migraine    Past Surgical History:  Procedure Laterality Date  . APPENDECTOMY    . BRONCHOSCOPY    . FINGER SURGERY Left    index finger  . LYMPH NODE BIOPSY Left 03/02/2017   Procedure: LYMPH NODE BIOPSY;  Surgeon: Melrose Nakayama, MD;  Location: [place];  Service: Thoracic;  Laterality: Left;  [name] VIDEO ASSISTED THORACOSCOPY (VATS)/WEDGE RESECTION Left 03/02/2017   Procedure: LEFT VIDEO ASSISTED THORACOSCOPY (VATS)/LEFT LOWER WEDGE AND LEFT UPPER LOBE RESECTION ;  Surgeon: Melrose Nakayama, MD;  Location: [place];  Service: Thoracic;  Laterality: Left;  [name] VIDEO BRONCHOSCOPY Bilateral 10/23/2016   Procedure: VIDEO BRONCHOSCOPY WITHOUT FLUORO;  Surgeon: Rigoberto Noel, MD;  Location: [facility];  Service: Cardiopulmonary;  Laterality: Bilateral;     Allergies: Patient has no known allergies.  Medications: Prior to Admission medications   Medication Sig Start Date End Date Taking? Authorizing Provider  acetaminophen (TYLENOL) 325 MG tablet Take 650 mg by mouth every 6 (six) hours as needed for mild pain or  headache.    Yes [provider]  ibuprofen (ADVIL,MOTRIN) 200 MG tablet Take 200 mg by mouth every 6 (six) hours as needed for moderate pain.   Yes [provider]  nilotinib (TASIGNA) 150 MG capsule Take 2 capsules (300 mg total) by mouth every 12 (twelve) hours. 11/15/16  Yes Wyatt Portela, MD  escitalopram (LEXAPRO) 10 MG tablet Take 1 tablet (10 mg total) by mouth daily. 08/13/17   Hoyt Koch, MD  fluticasone furoate-vilanterol (BREO ELLIPTA) 100-25 MCG/INH AEPB Inhale 1 puff into the lungs daily. Patient not taking: Reported on 08/13/2017 11/15/16   Rigoberto Noel, MD  ibuprofen (ADVIL,MOTRIN) 200 MG tablet Take 2 tablets (400 mg total) by mouth every 8 (eight) hours as needed for headache or moderate pain. Patient not taking: Reported on 08/13/2017 03/05/17   Nani Skillern, PA-C  meloxicam (MOBIC) 15 MG tablet Take 1 tablet (15 mg total) by mouth daily. PATIENT NEEDS OFFICE VISIT FOR ADDITIONAL REFILLS Patient not taking: Reported on 08/13/2017 07/05/16   Wardell Honour, MD  [facility] HFA [phone] Base) MCG/ACT inhaler Inhale 2 puffs into the lungs every 4 (four) hours as needed for wheezing. 10/23/16   Debbe Odea, MD  rizatriptan (MAXALT-MLT) 10 MG disintegrating tablet Take 1 tablet (10 mg total) by mouth as needed for migraine. May repeat in 2 hours if needed Patient not taking: Reported on 08/13/2017 01/01/17   Penumalli, Earlean Polka, MD  topiramate (TOPAMAX) 50 MG tablet Take 1 tablet (50 mg total) by mouth 2 (two) times daily. Patient not taking:  Reported on 08/13/2017 04/02/17   Penni Bombard, MD     Vital Signs: BP 115/77 (BP Location: Left Arm)   Pulse 85   Temp 99.5 F (37.5 C) (Oral)   Resp 20   Ht 5' 10"  (1.778 m)   Wt 188 lb 1.6 oz (85.3 kg)   SpO2 97%   BMI 26.99 kg/m   Physical Exam awake, alert.  Chest clear to auscultation bilaterally.  Heart with regular rate and rhythm.  Abdomen soft, positive bowel sounds, palpable spleen.  No  significant lower extremity edema.  Imaging: Ct Abdomen Pelvis Wo Contrast  Result Date: 08/14/2017 CLINICAL DATA:  Evaluate for splenomegaly, history of CML in relapse EXAM: CT ABDOMEN AND PELVIS WITHOUT CONTRAST TECHNIQUE: Multidetector CT imaging of the abdomen and pelvis was performed following the standard protocol without IV contrast. COMPARISON:  CT abdomen and pelvis of 08/12/2008 FINDINGS: Lower chest: The lung bases clear. Mild atelectasis and scarring with adjacent sutures is noted within the left lower lobe posteriorly. The heart is mildly enlarged. Hepatobiliary: The liver is unremarkable in the unenhanced state. No calcified gallstones are seen. Pancreas: The pancreas is unremarkable on this unenhanced study. The pancreatic duct does not appear to be dilated. Spleen: Significant enlargement of the spleen is noted. However the spleen does not measure as large as it measured on the CT of 2010. On axial image 46 series 2 the spleen measures 10.3 by 21.5 cm compared to 26.3 cm in longest dimension on the prior CT. No intra splenic lesion is seen. The spleen does extend below the left iliac crest into the left upper pelvis on coronal images compared to the prior measurements, the spleen currently measures 27.8 x 13.7 cm the prior measurements of 34.9 x 18.8 cm. Adrenals/Urinary Tract: The adrenal glands are unremarkable. The left kidney is somewhat compressed posterolateral posterior medially by the significantly enlarged spleen, similar to the prior study. However no renal calculi are seen and there is no evidence of hydronephrosis. The ureters are difficult to visualize but do not appear to be dilated, and the urinary bladder is not well distended but appears unremarkable. Stomach/Bowel: The stomach is post medially by the prominent spleen and the stomach is decompressed. No small bowel distention is seen. Colon is also displaced medially by the enlarged spleen but no colonic lesion is seen. The  terminal ileum is unremarkable the appendix is not well seen but no inflammatory process is noted. Vascular/Lymphatic: The abdominal aorta is normal in caliber. No definite adenopathy is seen. Reproductive: The prostate is normal in size. Other: No abdominal wall hernia is noted. Musculoskeletal: The lumbar vertebrae are in normal alignment with normal intervertebral disc spaces. The SI joints are corticated. IMPRESSION: 1. Significant splenomegaly, although the spleen measures somewhat smaller than on the CT from 2010 as noted above. 2. No definite abdominal or pelvic adenopathy is noted. Electronically Signed   By: Ivar Drape M.D.   On: 08/14/2017 17:05    Labs:  CBC: Recent Labs    08/13/17 1410 08/13/17 1638 08/13/17 1818 08/14/17 1855 08/15/17 0454  WBC 270.9 Repeated and verified X2.* 220.5*  --  249.7* 220.2*  HGB 4.8 Repeated and verified X2.* 4.8*  --  8.1* 7.5*  HCT 15.4 Repeated and verified X2.* 15.0*  --  23.5* 22.1*  PLT 57.0* 38* 38* 42* 36*    COAGS: Recent Labs    10/22/16 0403 02/28/17 1500 08/13/17 1638 08/13/17 1818  INR 1.05 1.10 1.29 1.23  APTT  --  28 37* 35    BMP: Recent Labs    03/04/17 0149 08/13/17 1410 08/13/17 1638 08/14/17 0849 08/15/17 0454  NA 142 137 140 139 142  K 3.5 3.8 3.2* 3.3* 3.2*  CL 113* 103 106 111 108  CO2 22 26 24 24 23   GLUCOSE 100* 90 93 91 96  BUN 6 11 12 9 11   CALCIUM 8.6* 9.2 9.0 8.5* 8.4*  CREATININE 1.14 1.12 1.15 1.10 1.04  GFRNONAA >60  --  >60 >60 >60  GFRAA >60  --  >60 >60 >60    LIVER FUNCTION TESTS: Recent Labs    03/04/17 0149 08/13/17 1410 08/13/17 1638 08/15/17 0454  BILITOT 0.6 1.2 1.4* 1.9*  AST 17 19 21 20   ALT 23 12 13* 15*  ALKPHOS 45 92 86 93  PROT 5.7* 7.1 6.5 5.9*  ALBUMIN 3.0* 4.0 3.5 3.3*    Assessment and Plan: Pt with history of CML with poor compliance with treatment regimen and presents now with probable recurrence with white cell count over 200,000 and increased blast  count. Request received for CT-guided bone marrow biopsy for further evaluation.Risks and benefits discussed with the patient including, but not limited to bleeding, infection, damage to adjacent structures or low yield requiring additional tests.  All of the patient's questions were answered, patient is agreeable to proceed. Consent signed and in chart.  Patient ate breakfast this morning but is willing to undergo biopsy with IV fentanyl and local anesthesia only therefore will proceed with case this morning.   Electronically Signed: D. Rowe Robert, PA-C 08/15/2017, 9:03 AM   I spent a total of 20 minutes at the the patient's bedside AND on the patient's hospital floor or unit, greater than 50% of which was counseling/coordinating care for CT-guided bone marrow biopsy    Patient ID: Nathan Kerr, male   DOB: 1986-01-17, 32 y.o.   MRN: [id number]

## 2017-08-15 NOTE — Sedation Documentation (Signed)
Pt has a constant nosebleed. States unable to wear Dean. Not administering full sedation fentanyl only full procedure.

## 2017-08-15 NOTE — Progress Notes (Signed)
   08/15/17 1455  Clinical Encounter Type  Visited With Patient and family together  Visit Type Initial  Referral From Nurse  Consult/Referral To Chaplain   Responding to a SCC for separation/depression. Upon entering the room, the lights were off and patient was in the chair with family beside him.  I introduced myself and the patient said, I don't mean any offense but you are in the wrong room.  I told him if he needed anything to please let us know.  Will follow and support as needed. Chaplain Katherene Ponto

## 2017-08-15 NOTE — Procedures (Signed)
Interventional Radiology Procedure Note  Procedure: CT guided bone marrow aspiration and biopsy  Complications: None  EBL: < 10 mL  Findings: Aspirate and core biopsy performed of bone marrow in right iliac bone.  Plan: Bedrest supine x 1 hrs  Julez Huseby T. Dantonio Justen, M.D Pager:  [phone]   

## 2017-08-15 NOTE — Progress Notes (Signed)
MD has been paged for HGB and Platelets results but no call received, no new orders seen

## 2017-08-15 NOTE — Progress Notes (Signed)
IP PROGRESS NOTE  Subjective:     Nathan Kerr reports no major complaints overnight.  He was able to sleep better for the first time in a while.  He denies any fevers, chills or sweating.  He denies any alteration of his mental status.  He is not reporting any headaches or blurry vision.  His appetite has been reasonable.  He denied any chest pain or difficulty breathing.  He denies any cough or wheezing.  Does report abdominal fullness but no changes in his appetite.  His mood still depressed but his affect is appropriate.   Objective:  Vital signs in last 24 hours: Temp:  [99 F (37.2 C)-99.7 F (37.6 C)] 99.5 F (37.5 C) (04/10 0435) Pulse Rate:  [68-85] 85 (04/10 0435) Resp:  [14-20] 20 (04/10 0435) BP: (105-115)/(60-77) 115/77 (04/10 0435) SpO2:  [97 %-99 %] 97 % (04/10 0435) Weight:  [188 lb 1.6 oz (85.3 kg)] 188 lb 1.6 oz (85.3 kg) (04/10 0435) Weight change: -1 lb 14.4 oz (-0.862 kg) Last BM Date: 08/14/17  Intake/Output from previous day: 04/09 0701 - 04/10 0700 In: 1070 [P.O.:120; Blood:950] Out: -  Comfortable appearing man without distress. Mouth: No thrush or ulcers. Resp: Clear all lung fields without any wheezes or dullness to percussion. Cardio: regular rate and rhythm, S1, S2 normal, no murmur, click, rub or gallop GI: Soft, nontender with good bowel sounds.  Spleen was palpated in the left upper quadrant. Extremities: extremities normal, atraumatic, no cyanosis or edema  Skin: No rashes or lesions.  No ecchymosis or petechiae. Neurological: Speech and mentation intact.  No motor sensory deficits. Musculoskeletal: No joint deformity or effusion.    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 COMPARISON:  CT abdomen and pelvis of 08/12/2008 FINDINGS: Lower chest: The lung bases clear. Mild atelectasis and scarring with adjacent sutures is noted within the left lower lobe posteriorly. The heart is mildly enlarged. Hepatobiliary: The liver is unremarkable in the unenhanced state. No calcified gallstones are seen. Pancreas: The pancreas is unremarkable on this unenhanced study. The pancreatic duct does not appear to be dilated. Spleen: Significant enlargement of the spleen is noted. However the spleen does not measure as large as it measured on the CT of 2010. On axial image 46 series 2 the spleen measures 10.3 by 21.5 cm compared to 26.3 cm in longest dimension on the prior CT. No intra splenic lesion is seen. The spleen does extend below the left iliac crest into the left upper pelvis on coronal images compared to the prior measurements, the spleen currently measures 27.8 x 13.7 cm the prior measurements of 34.9 x 18.8 cm. Adrenals/Urinary Tract: The adrenal glands are unremarkable. The left kidney is somewhat compressed posterolateral posterior medially by the significantly enlarged spleen, similar to the prior study. However no renal calculi are seen  and there is no evidence of hydronephrosis. The ureters are difficult to visualize but do not appear to be dilated, and the urinary bladder is not well distended but appears unremarkable. Stomach/Bowel: The stomach is post medially by the prominent spleen and the stomach is decompressed. No small bowel distention is seen. Colon is also displaced medially by the enlarged spleen but no colonic lesion is seen. The terminal ileum is  unremarkable the appendix is not well seen but no inflammatory process is noted. Vascular/Lymphatic: The abdominal aorta is normal in caliber. No definite adenopathy is seen. Reproductive: The prostate is normal in size. Other: No abdominal wall hernia is noted. Musculoskeletal: The lumbar vertebrae are in normal alignment with normal intervertebral disc spaces. The SI joints are corticated. IMPRESSION: 1. Significant splenomegaly, although the spleen measures somewhat smaller than on the CT from 2010 as noted above. 2. No definite abdominal or pelvic adenopathy is noted. Electronically Signed   By: Ivar Drape M.D.   On: 08/14/2017 17:05    Medications: I have reviewed the patient's current medications.  Assessment/Plan:  32 year old gentleman with the following issues:  1. 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  He has been noncompliant with his medication throughout his disease course but certainly in the last 6 months.  He presented with a white cell count of over 200,000 with increased blast count.  Peripheral blood flow cytometry obtained and showed 48% myeloid blasts.  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  To fully document that, he will require a bone marrow biopsy to be completed in the immediate future.  CT scan of abdomen and pelvis was reviewed and shows splenomegaly but no other abnormalities.  To control his blast count, start him on hydroxyurea with tumor lysis prophylaxis including hydration and allopurinol.  Upon controlling his white cell count, he will require consultation at [facility] which hopefully we can do as an outpatient.  I discussed this case with Dr. Florene Glen over the phone.  The goal is to get him to an allogeneic stem cell transplant eventually.  2.  Anemia: No evidence of hemolysis noted at this time.  He responded to packed red cell transfusion with hemoglobin currently stable.  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  His kidney function appears adequate with normal phosphorus and slightly decreased potassium.  His uric acid is elevated but is decreasing.  He will require allopurinol moving forward.  4.  Depression: Continues to be an issue with him the Lexapro was restarted.  5.  Disposition: Not quite ready for discharge at this time.  We will ensure stability of his counts on hydroxyurea prior to discharge.  25  minutes was spent with the patient face-to-face today.  More than 50% of time was dedicated to patient counseling, answering questions and coordination of the his multifaceted care.    LOS: 2 days   Zola Button 08/15/2017, 7:36 AM

## 2017-08-16 ENCOUNTER — Telehealth: Payer: Self-pay | Admitting: *Deleted

## 2017-08-16 ENCOUNTER — Telehealth: Payer: Self-pay | Admitting: Oncology

## 2017-08-16 ENCOUNTER — Other Ambulatory Visit: Payer: Self-pay | Admitting: Oncology

## 2017-08-16 DIAGNOSIS — C9210 Chronic myeloid leukemia, BCR/ABL-positive, not having achieved remission: Secondary | ICD-10-CM

## 2017-08-16 LAB — CBC WITH DIFFERENTIAL/PLATELET
BASOS ABS: 0 10*3/uL (ref 0.0–0.1)
BASOS PCT: 0 %
BLASTS: 34 %
EOS ABS: 0 10*3/uL (ref 0.0–0.7)
EOS PCT: 0 %
HCT: 21.6 % — ABNORMAL LOW (ref 39.0–52.0)
Hemoglobin: 7.1 g/dL — ABNORMAL LOW (ref 13.0–17.0)
LYMPHS ABS: 9.4 10*3/uL — AB (ref 0.7–4.0)
LYMPHS PCT: 5 %
MCH: 25.9 pg — ABNORMAL LOW (ref 26.0–34.0)
MCHC: 32.9 g/dL (ref 30.0–36.0)
MCV: 78.8 fL (ref 78.0–100.0)
METAMYELOCYTES PCT: 13 %
Monocytes Absolute: 5.6 10*3/uL — ABNORMAL HIGH (ref 0.1–1.0)
Monocytes Relative: 3 %
Myelocytes: 14 %
NEUTROS PCT: 30 %
Neutro Abs: 108.6 10*3/uL — ABNORMAL HIGH (ref 1.7–7.7)
PLATELETS: 27 10*3/uL — AB (ref 150–400)
Promyelocytes Relative: 1 %
RBC: 2.74 MIL/uL — ABNORMAL LOW (ref 4.22–5.81)
RDW: 20 % — AB (ref 11.5–15.5)
WBC: 187.3 10*3/uL (ref 4.0–10.5)
nRBC: 2 /100 WBC — ABNORMAL HIGH

## 2017-08-16 LAB — COMPREHENSIVE METABOLIC PANEL
ALK PHOS: 84 U/L (ref 38–126)
ALT: 14 U/L — AB (ref 17–63)
AST: 19 U/L (ref 15–41)
Albumin: 3.1 g/dL — ABNORMAL LOW (ref 3.5–5.0)
Anion gap: 10 (ref 5–15)
BUN: 11 mg/dL (ref 6–20)
CALCIUM: 8.4 mg/dL — AB (ref 8.9–10.3)
CHLORIDE: 108 mmol/L (ref 101–111)
CO2: 24 mmol/L (ref 22–32)
CREATININE: 1 mg/dL (ref 0.61–1.24)
GFR calc Af Amer: >60 mL/min (ref >60)
GFR calc non Af Amer: >60 mL/min (ref >60)
Glucose, Bld: 94 mg/dL (ref 65–99)
Potassium: 3.3 mmol/L — ABNORMAL LOW (ref 3.5–5.1)
Sodium: 142 mmol/L (ref 135–145)
Total Bilirubin: 1.9 mg/dL — ABNORMAL HIGH (ref 0.3–1.2)
Total Protein: 5.9 g/dL — ABNORMAL LOW (ref 6.5–8.1)

## 2017-08-16 LAB — LACTATE DEHYDROGENASE: LDH: 1669 U/L — AB (ref 98–192)

## 2017-08-16 LAB — PHOSPHORUS: Phosphorus: 5.7 mg/dL — ABNORMAL HIGH (ref 2.5–4.6)

## 2017-08-16 LAB — URIC ACID: URIC ACID, SERUM: 9.7 mg/dL — AB (ref 4.4–7.6)

## 2017-08-16 MED ORDER — ALLOPURINOL 100 MG PO TABS: 100.0000 mg | ORAL_TABLET | Freq: Two times a day (BID) | ORAL | 0 refills | Status: DC

## 2017-08-16 MED ORDER — HYDROXYUREA 500 MG PO CAPS
500.0000 mg | ORAL_CAPSULE | Freq: Two times a day (BID) | ORAL | Status: DC
  Administered 2017-08-16: 500 mg via ORAL
  Filled 2017-08-16: qty 1

## 2017-08-16 MED ORDER — FLUTICASONE FUROATE-VILANTEROL 100-25 MCG/INH IN AEPB: 1.0000 | INHALATION_SPRAY | Freq: Every day | RESPIRATORY_TRACT | 3 refills | Status: DC

## 2017-08-16 MED ORDER — HYDROXYUREA 500 MG PO CAPS
500.0000 mg | ORAL_CAPSULE | Freq: Two times a day (BID) | ORAL | 0 refills | Status: DC
Start: 2017-08-16 — End: 2024-01-29

## 2017-08-16 MED ORDER — ENSURE ENLIVE PO LIQD: 237.0000 mL | Freq: Two times a day (BID) | ORAL | 0 refills | Status: DC

## 2017-08-16 MED ORDER — PROAIR HFA 108 (90 BASE) MCG/ACT IN AERS: 2.0000 | INHALATION_SPRAY | RESPIRATORY_TRACT | 0 refills | Status: DC | PRN

## 2017-08-16 MED ORDER — POTASSIUM CHLORIDE CRYS ER 20 MEQ PO TBCR
40.0000 meq | EXTENDED_RELEASE_TABLET | Freq: Once | ORAL | Status: AC
  Administered 2017-08-16: 40 meq via ORAL
  Filled 2017-08-16: qty 2

## 2017-08-16 NOTE — Progress Notes (Signed)
IP PROGRESS NOTE  Subjective:     [name] continues to feel well without any recent complaints overnight.  He tolerated bone marrow biopsy without any complications.  He denies any headaches, blurry vision or neurological symptoms.  He denies any chest pain or difficulty breathing.  Continues to eat well and denies any bleeding or bruising.   Objective:  Vital signs in last 24 hours: Temp:  [97.7 F (36.5 C)-100 F (37.8 C)] 97.7 F (36.5 C) (04/11 0437) Pulse Rate:  [75-101] 75 (04/11 0437) Resp:  [16-18] 18 (04/11 0437) BP: (111-137)/(65-93) 111/65 (04/11 0437) SpO2:  [96 %-99 %] 97 % (04/11 0437) Weight:  [184 lb 8 oz (83.7 kg)] 184 lb 8 oz (83.7 kg) (04/11 0500) Weight change: -3 lb 9.6 oz (-1.633 kg) Last BM Date: 08/15/17  Intake/Output from previous day: 04/10 0701 - 04/11 0700 In: -  Out: 475 [Urine:475] Alert, awake gentleman without distress. Mouth: Mucous membranes are moist and pink. Resp: Clear without any rhonchi or wheezes or dullness to percussion. Cardio: Regular regular rate and rhythm without any murmurs. GI: Splenomegaly palpated without any rebound or guarding.  2 ascites. Skin: No petechia or ecchymosis. Neurological: No motor or sensory deficits. Musculoskeletal: Full range of motion noted in all extremities.    Lab Results: Recent Labs    08/15/17 0454 08/16/17 0439  WBC 220.2* 187.3*  HGB 7.5* 7.1*  HCT 22.1* 21.6*  PLT 36* 27*    BMET Recent Labs    08/15/17 0454 08/16/17 0439  NA 142 142  K 3.2* 3.3*  CL 108 108  CO2 23 24  GLUCOSE 96 94  BUN 11 11  CREATININE 1.04 1.00  CALCIUM 8.4* 8.4*    Studies/Results: Ct Abdomen Pelvis Wo Contrast  Result Date: 08/14/2017 CLINICAL DATA:  Evaluate for splenomegaly, history of CML in relapse EXAM: CT ABDOMEN AND PELVIS WITHOUT CONTRAST TECHNIQUE: Multidetector CT imaging of the abdomen and pelvis was performed following the standard protocol without IV contrast. COMPARISON:  CT  abdomen and pelvis of 08/12/2008 FINDINGS: Lower chest: The lung bases clear. Mild atelectasis and scarring with adjacent sutures is noted within the left lower lobe posteriorly. The heart is mildly enlarged. Hepatobiliary: The liver is unremarkable in the unenhanced state. No calcified gallstones are seen. Pancreas: The pancreas is unremarkable on this unenhanced study. The pancreatic duct does not appear to be dilated. Spleen: Significant enlargement of the spleen is noted. However the spleen does not measure as large as it measured on the CT of 2010. On axial image 46 series 2 the spleen measures 10.3 by 21.5 cm compared to 26.3 cm in longest dimension on the prior CT. No intra splenic lesion is seen. The spleen does extend below the left iliac crest into the left upper pelvis on coronal images compared to the prior measurements, the spleen currently measures 27.8 x 13.7 cm the prior measurements of 34.9 x 18.8 cm. Adrenals/Urinary Tract: The adrenal glands are unremarkable. The left kidney is somewhat compressed posterolateral posterior medially by the significantly enlarged spleen, similar to the prior study. However no renal calculi are seen and there is no evidence of hydronephrosis. The ureters are difficult to visualize but do not appear to be dilated, and the urinary bladder is not well distended but appears unremarkable. Stomach/Bowel: The stomach is post medially by the prominent spleen and the stomach is decompressed. No small bowel distention is seen. Colon is also displaced medially by the enlarged spleen but no colonic lesion is  seen. The terminal ileum is unremarkable the appendix is not well seen but no inflammatory process is noted. Vascular/Lymphatic: The abdominal aorta is normal in caliber. No definite adenopathy is seen. Reproductive: The prostate is normal in size. Other: No abdominal wall hernia is noted. Musculoskeletal: The lumbar vertebrae are in normal alignment with normal  intervertebral disc spaces. The SI joints are corticated. IMPRESSION: 1. Significant splenomegaly, although the spleen measures somewhat smaller than on the CT from 2010 as noted above. 2. No definite abdominal or pelvic adenopathy is noted. Electronically Signed   By: Ivar Drape M.D.   On: 08/14/2017 17:05   Ct Biopsy     Medications: I have reviewed the patient's current medications.  Assessment/Plan:  32 year old gentleman with the following issues:  1. CML initially diagnosed in 2010 and currently in accelerated and possible blast phase.  Bone marrow biopsy performed on 08/15/2017 is currently pending.  Peripheral blood flow cytometry showed increased blasts indicating blast phase at this time.  He was started on hydroxyurea and his white cell count continues to drop appropriately.  He has no signs or symptoms of hyper leukocytosis.  The plan is to reduce his hydroxyurea to 500 mg twice a day and monitor his counts very closely as an outpatient.  The plan is to set him up with [facility] for evaluation for stem cell transplant once his white count is controlled.  2.  Anemia: Hemoglobin stable at this time and he is asymptomatic.  3.  Tumor lysis syndrome: None reported at this time with normal creatinine, calcium and phosphorus.  He will continue on allopurinol.  4.  Depression: He has therapy on a weekly basis which will continue upon discharge.  5.  Disposition: I have no objections to discharge today and will set up weekly follow-up at the cancer center to check his labs.  15  minutes was spent with the patient face-to-face today.  More than 50% of time was dedicated to patient counseling, education and answering questions regarding future plan of care.   LOS: 3 days   Zola Button 08/16/2017, 7:58 AM

## 2017-08-16 NOTE — Telephone Encounter (Signed)
Scheduled appt per 4/11 sch msg - spoke with patient regarding appts.  °

## 2017-08-16 NOTE — Telephone Encounter (Signed)
LM for patient to call me WM:GEEA at [facility] tomorrow.

## 2017-08-16 NOTE — Telephone Encounter (Signed)
Spoke with patient, gave appt time to see dr Florene Glen 08/17/17 @ 1215. 3rd floor cancer center [facility]. [address]. Gave him jennifer new patient coordinator's private line. [phone]. Should he consider not making the appt, he is to call her immediately. Patient verbalized understanding.

## 2017-08-16 NOTE — Telephone Encounter (Signed)
Fine, can do at next visit

## 2017-08-16 NOTE — Discharge Summary (Signed)
PATIENT DETAILS Name: Nathan Kerr Age: 32 y.o. Sex: male Date of Birth: May 09, 1985 MRN: [id number]. Admitting Physician: Nathan Jaffe, MD [id number], Nathan Specter, FNP  Admit Date: 08/13/2017 Discharge date: 08/16/2017  Recommendations for Outpatient Follow-up:  1. Follow up with PCP in 1-2 weeks 2. Please obtain BMP/CBC in one week 3. Please follow up on the following pending results:Bone marrow bx  Admitted From:  Home  Disposition: [place]: No  Equipment/Devices: None  Discharge Condition: Stable  CODE STATUS: FULL CODE  Diet recommendation:   Regular  Brief Summary: See H&P, Labs, Consult and Test reports for all details in brief, Patient is a 32 y.o. male with history of CML-noncompliant with medications -admitted with fatigue and dyspnea on exertion-found to have profoundly elevated WBC count severe anemia along with thrombocytopenia.  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  Plans are to continue Hydrea and Nilotinib-obtain if bone marrow biopsy, once his CBC is stabilized, patient will be referred in the outpatient setting by hematology to [facility] for eventual bone marrow transplantation  Brief Hospital Course: Blast phase of CML: Unfortunately noncompliant with medications-plans are to continue Hydrea/Nilotinib-bone marrow biopsy are pending-once his counts stabilized further, patient will be referred in the outpatient setting to [facility] for eventual stem cell transplantation.  Followed closely by hematology during his hospital stay-underwent bone marrow transplantation on 4/10-currently pending.  Dr. Shadad-hematology-recommends discharge home today-he will arrange for close outpatient follow-up.  Per hematology-patient will continue to have some degree of anemia and thrombocytopenia-and will require close monitoring with periodic blood work  that hematology will arrange.  No indication to transfuse PRBC or platelets at this time-patient does not have any symptoms of anemia at this point-and he does not have any evidence of bleeding.  As per hematology recommendation-patient will be discharged home with close outpatient follow-up with hematology.  Anemia: Secondary to above-hemoglobin improved with 4 units of PRBC.  Although hemoglobin slightly trending down again-he is asymptomatic.  Dr. Alen Kerr will arrange for close outpatient follow-up with periodic blood work.  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  Currently no evidence of bleeding-he did have some mild epistaxis yesterday that has resolved.  Patient has been instructed not to take nonsteroidal anti-inflammatory medications, aspirin until platelet count improves.  Recurrent epistaxis: This is a long-standing history-it predates his diagnosis of CML-this occurred on 4/10-and has completely resolved today.    Hyperuricemia:  Allopurinol on discharge.   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  Or if you develop a intractable headache.   Call MD for:  extreme fatigue   Complete by:  As  directed    Diet general  Complete by:  As directed    Discharge instructions   Complete by:  As directed    Follow with Primary MD  Nathan Circle, FNP and your primary oncologist-Dr. Alen Kerr in 1 week  Oncology office will call you for follow-up appointment including blood work.  Do not take aspirin, nonsteroidal inflammatory drugs (like meloxicam, ibuprofen, Aleve, Advil) until her platelet count improves.  If you have headache try Tylenol.  Please make sure you talk with outpatient doctors before trying any over-the-counter remedy as well.  Please get a complete blood count and chemistry panel checked by your Primary MD at your next visit, and again as instructed by your Primary MD.  Get Medicines reviewed and adjusted: Please take all your medications with you for your next visit with your Primary MD  Laboratory/radiological data: Please request your Primary MD to go over all hospital tests and procedure/radiological results at the follow up, please ask your Primary MD to get all Hospital records sent to his/her office.  In some cases, they will be blood work, cultures and biopsy results pending at the time of your discharge. Please request that your primary care M.D. follows up on these results.  Also Note the following: If you experience worsening of your admission symptoms, develop shortness of breath, life threatening emergency, suicidal or homicidal thoughts you must seek medical attention immediately by calling 911 or calling your MD immediately  if symptoms less severe.  You must read complete instructions/literature along with all the possible adverse reactions/side effects for all the Medicines you take and that have been prescribed to you. Take any new Medicines after you have completely understood and accpet all the possible adverse reactions/side effects.   Do not drive when taking Pain medications or sleeping medications (Benzodaizepines)  Do not take more than  prescribed Pain, Sleep and Anxiety Medications. It is not advisable to combine anxiety,sleep and pain medications without talking with your primary care practitioner  Special Instructions: If you have smoked or chewed Tobacco  in the last 2 yrs please stop smoking, stop any regular Alcohol  and or any Recreational drug use.  Wear Seat belts while driving.  Please note: You were cared for by a hospitalist during your hospital stay. Once you are discharged, your primary care physician will handle any further medical issues. Please note that NO REFILLS for any discharge medications will be authorized once you are discharged, as it is imperative that you return to your primary care physician (or establish a relationship with a primary care physician if you do not have one) for your post hospital discharge needs so that they can reassess your need for medications and monitor your lab values.   Increase activity slowly   Complete by:  As directed      Allergies as of 08/16/2017   No Known Allergies     Medication List    STOP taking these medications   ibuprofen 200 MG tablet Commonly known as:  ADVIL,MOTRIN   meloxicam 15 MG tablet Commonly known as:  MOBIC   topiramate 50 MG tablet Commonly known as:  TOPAMAX     TAKE these medications   acetaminophen 325 MG tablet Commonly known as:  TYLENOL Take 650 mg by mouth every 6 (six) hours as needed for mild pain or headache.   allopurinol 100 MG tablet Commonly known as:  ZYLOPRIM Take 1 tablet (100 mg total) by mouth 2 (two) times daily.   escitalopram 10 MG tablet Commonly known as:  LEXAPRO Take 1 tablet (10 mg total) by mouth daily.   feeding supplement (ENSURE ENLIVE) Liqd Take 237 mLs by mouth 2 (two) times daily between meals.   fluticasone furoate-vilanterol 100-25 MCG/INH Aepb Commonly known as:  BREO ELLIPTA Inhale 1 puff into the lungs daily.   hydroxyurea 500 MG capsule Commonly known as:  HYDREA Take 1 capsule  (500 mg total) by mouth 2 (two) times daily. May take with food to minimize GI side effects.   nilotinib 150 MG capsule Commonly known as:  TASIGNA Take 2 capsules (300 mg total) by mouth every 12 (twelve) hours.   PROAIR HFA 108 (90 Base) MCG/ACT inhaler Generic drug:  albuterol Inhale 2 puffs into the lungs every 4 (four) hours as needed for wheezing.   rizatriptan 10 MG disintegrating tablet Commonly known as:  MAXALT-MLT Take 1 tablet (10 mg total) by mouth as needed for migraine. May repeat in 2 hours if needed      Follow-up Information    Nathan Circle, FNP. Schedule an appointment as soon as possible for a visit in 1 week(s).   Specialties:  Family Medicine, Infectious Diseases Contact information: [place] [address] [phone]        Wyatt Portela, MD Follow up.   Specialty:  Oncology Why:  office will call  Contact information: [place] [address] [phone]          No Known Allergies  Consultations:   hematology/oncology  Other Procedures/Studies: Ct Abdomen Pelvis Wo Contrast  Result Date: 08/14/2017 CLINICAL DATA:  Evaluate for splenomegaly, history of CML in relapse EXAM: CT ABDOMEN AND PELVIS WITHOUT CONTRAST TECHNIQUE: Multidetector CT imaging of the abdomen and pelvis was performed following the standard protocol without IV contrast. COMPARISON:  CT abdomen and pelvis of 08/12/2008 FINDINGS: Lower chest: The lung bases clear. Mild atelectasis and scarring with adjacent sutures is noted within the left lower lobe posteriorly. The heart is mildly enlarged. Hepatobiliary: The liver is unremarkable in the unenhanced state. No calcified gallstones are seen. Pancreas: The pancreas is unremarkable on this unenhanced study. The pancreatic duct does not appear to be dilated. Spleen: Significant enlargement of the spleen is noted. However the spleen does not measure as large as it measured on the CT of 2010.  On axial image 46 series 2 the spleen measures 10.3 by 21.5 cm compared to 26.3 cm in longest dimension on the prior CT. No intra splenic lesion is seen. The spleen does extend below the left iliac crest into the left upper pelvis on coronal images compared to the prior measurements, the spleen currently measures 27.8 x 13.7 cm the prior measurements of 34.9 x 18.8 cm. Adrenals/Urinary Tract: The adrenal glands are unremarkable. The left kidney is somewhat compressed posterolateral posterior medially by the significantly enlarged spleen, similar to the prior study. However no renal calculi are seen and there is no evidence of hydronephrosis. The ureters are difficult to visualize but do not appear to be dilated, and the urinary bladder is not well distended but appears unremarkable. Stomach/Bowel: The stomach is post medially by the prominent spleen and the stomach is decompressed. No small bowel distention is seen. Colon is also displaced medially by the enlarged spleen but no colonic lesion is seen. The terminal ileum is unremarkable the appendix is not well seen but no inflammatory process is noted. Vascular/Lymphatic: The abdominal aorta is normal in caliber. No definite adenopathy is seen. Reproductive: The prostate is normal  in size. Other: No abdominal wall hernia is noted. Musculoskeletal: The lumbar vertebrae are in normal alignment with normal intervertebral disc spaces. The SI joints are corticated. IMPRESSION: 1. Significant splenomegaly, although the spleen measures somewhat smaller than on the CT from 2010 as noted above. 2. No definite abdominal or pelvic adenopathy is noted. Electronically Signed   By: Ivar Drape M.D.   On: 08/14/2017 17:05   Ct Biopsy  Result Date: 08/15/2017 CLINICAL DATA:  History of chronic myeloid leukemia with probable relapse and abnormal peripheral blood smear demonstrating increased blasts. EXAM: CT GUIDED BONE MARROW ASPIRATION AND BIOPSY ANESTHESIA/SEDATION: Fentanyl  150 mcg IV Full conscious sedation could not be administered as the patient had eaten some food prior to the procedure. PROCEDURE: The procedure risks, benefits, and alternatives were explained to the patient. Questions regarding the procedure were encouraged and answered. The patient understands and consents to the procedure. A time out was performed prior to initiating the procedure. The right gluteal region was prepped with chlorhexidine. Sterile gown and sterile gloves were used for the procedure. Local anesthesia was provided with 1% Lidocaine. Under CT guidance, an 11 gauge On Control bone cutting needle was advanced from a posterior approach into the right iliac bone. Needle positioning was confirmed with CT. Initial non heparinized and heparinized aspirate samples were obtained of bone marrow. Core biopsy was performed via the On Control drill needle. COMPLICATIONS: None FINDINGS: Inspection of initial aspirate did reveal visible particles. Intact core biopsy sample was obtained. IMPRESSION: CT guided bone marrow biopsy of right posterior iliac bone with both aspirate and core samples obtained. Electronically Signed   By: Aletta Edouard M.D.   On: 08/15/2017 11:41   Ct Bone Marrow Biopsy & Aspiration  Result Date: 08/15/2017 CLINICAL DATA:  History of chronic myeloid leukemia with probable relapse and abnormal peripheral blood smear demonstrating increased blasts. EXAM: CT GUIDED BONE MARROW ASPIRATION AND BIOPSY ANESTHESIA/SEDATION: Fentanyl 150 mcg IV Full conscious sedation could not be administered as the patient had eaten some food prior to the procedure. PROCEDURE: The procedure risks, benefits, and alternatives were explained to the patient. Questions regarding the procedure were encouraged and answered. The patient understands and consents to the procedure. A time out was performed prior to initiating the procedure. The right gluteal region was prepped with chlorhexidine. Sterile gown and  sterile gloves were used for the procedure. Local anesthesia was provided with 1% Lidocaine. Under CT guidance, an 11 gauge On Control bone cutting needle was advanced from a posterior approach into the right iliac bone. Needle positioning was confirmed with CT. Initial non heparinized and heparinized aspirate samples were obtained of bone marrow. Core biopsy was performed via the On Control drill needle. COMPLICATIONS: None FINDINGS: Inspection of initial aspirate did reveal visible particles. Intact core biopsy sample was obtained. IMPRESSION: CT guided bone marrow biopsy of right posterior iliac bone with both aspirate and core samples obtained. Electronically Signed   By: Aletta Edouard M.D.   On: 08/15/2017 11:41      TODAY-DAY OF DISCHARGE:  Subjective:   Nathan Kerr today has no headache,no chest abdominal pain,no new weakness tingling or numbness, feels much better wants to go home today.   Objective:   Blood pressure 111/65, pulse 75, temperature 97.7 F (36.5 C), temperature source Oral, resp. rate 18, height 5' 10"  (1.778 m), weight 83.7 kg (184 lb 8 oz), SpO2 97 %. No intake or output data in the 24 hours ending 08/16/17 [place]  08/14/17 0500 08/15/17 0435 08/16/17 0500  Weight: 82.6 kg (182 lb) 85.3 kg (188 lb 1.6 oz) 83.7 kg (184 lb 8 oz)    Exam: Awake Alert, Oriented *3, No new F.N deficits, Normal affect .AT,PERRAL Supple Neck,No JVD, No cervical lymphadenopathy appriciated.  Symmetrical Chest wall movement, Good air movement bilaterally, CTAB RRR,No Gallops,Rubs or new Murmurs, No Parasternal Heave +ve B.Sounds, Abd Soft, Non tender, No organomegaly appriciated, No rebound -guarding or rigidity. No Cyanosis, Clubbing or edema, No new Rash or bruise   PERTINENT RADIOLOGIC STUDIES: Ct Abdomen Pelvis Wo Contrast  Result Date: 08/14/2017 CLINICAL DATA:  Evaluate for splenomegaly, history of CML in relapse EXAM: CT ABDOMEN AND PELVIS WITHOUT CONTRAST  TECHNIQUE: Multidetector CT imaging of the abdomen and pelvis was performed following the standard protocol without IV contrast. COMPARISON:  CT abdomen and pelvis of 08/12/2008 FINDINGS: Lower chest: The lung bases clear. Mild atelectasis and scarring with adjacent sutures is noted within the left lower lobe posteriorly. The heart is mildly enlarged. Hepatobiliary: The liver is unremarkable in the unenhanced state. No calcified gallstones are seen. Pancreas: The pancreas is unremarkable on this unenhanced study. The pancreatic duct does not appear to be dilated. Spleen: Significant enlargement of the spleen is noted. However the spleen does not measure as large as it measured on the CT of 2010. On axial image 46 series 2 the spleen measures 10.3 by 21.5 cm compared to 26.3 cm in longest dimension on the prior CT. No intra splenic lesion is seen. The spleen does extend below the left iliac crest into the left upper pelvis on coronal images compared to the prior measurements, the spleen currently measures 27.8 x 13.7 cm the prior measurements of 34.9 x 18.8 cm. Adrenals/Urinary Tract: The adrenal glands are unremarkable. The left kidney is somewhat compressed posterolateral posterior medially by the significantly enlarged spleen, similar to the prior study. However no renal calculi are seen and there is no evidence of hydronephrosis. The ureters are difficult to visualize but do not appear to be dilated, and the urinary bladder is not well distended but appears unremarkable. Stomach/Bowel: The stomach is post medially by the prominent spleen and the stomach is decompressed. No small bowel distention is seen. Colon is also displaced medially by the enlarged spleen but no colonic lesion is seen. The terminal ileum is unremarkable the appendix is not well seen but no inflammatory process is noted. Vascular/Lymphatic: The abdominal aorta is normal in caliber. No definite adenopathy is seen. Reproductive: The prostate is  normal in size. Other: No abdominal wall hernia is noted. Musculoskeletal: The lumbar vertebrae are in normal alignment with normal intervertebral disc spaces. The SI joints are corticated. IMPRESSION: 1. Significant splenomegaly, although the spleen measures somewhat smaller than on the CT from 2010 as noted above. 2. No definite abdominal or pelvic adenopathy is noted. Electronically Signed   By: Ivar Drape M.D.   On: 08/14/2017 17:05   Ct Biopsy  Result Date: 08/15/2017 CLINICAL DATA:  History of chronic myeloid leukemia with probable relapse and abnormal peripheral blood smear demonstrating increased blasts. EXAM: CT GUIDED BONE MARROW ASPIRATION AND BIOPSY ANESTHESIA/SEDATION: Fentanyl 150 mcg IV Full conscious sedation could not be administered as the patient had eaten some food prior to the procedure. PROCEDURE: The procedure risks, benefits, and alternatives were explained to the patient. Questions regarding the procedure were encouraged and answered. The patient understands and consents to the procedure. A time out was performed prior to initiating the procedure. The right  gluteal region was prepped with chlorhexidine. Sterile gown and sterile gloves were used for the procedure. Local anesthesia was provided with 1% Lidocaine. Under CT guidance, an 11 gauge On Control bone cutting needle was advanced from a posterior approach into the right iliac bone. Needle positioning was confirmed with CT. Initial non heparinized and heparinized aspirate samples were obtained of bone marrow. Core biopsy was performed via the On Control drill needle. COMPLICATIONS: None FINDINGS: Inspection of initial aspirate did reveal visible particles. Intact core biopsy sample was obtained. IMPRESSION: CT guided bone marrow biopsy of right posterior iliac bone with both aspirate and core samples obtained. Electronically Signed   By: Aletta Edouard M.D.   On: 08/15/2017 11:41   Ct Bone Marrow Biopsy & Aspiration  Result  Date: 08/15/2017 CLINICAL DATA:  History of chronic myeloid leukemia with probable relapse and abnormal peripheral blood smear demonstrating increased blasts. EXAM: CT GUIDED BONE MARROW ASPIRATION AND BIOPSY ANESTHESIA/SEDATION: Fentanyl 150 mcg IV Full conscious sedation could not be administered as the patient had eaten some food prior to the procedure. PROCEDURE: The procedure risks, benefits, and alternatives were explained to the patient. Questions regarding the procedure were encouraged and answered. The patient understands and consents to the procedure. A time out was performed prior to initiating the procedure. The right gluteal region was prepped with chlorhexidine. Sterile gown and sterile gloves were used for the procedure. Local anesthesia was provided with 1% Lidocaine. Under CT guidance, an 11 gauge On Control bone cutting needle was advanced from a posterior approach into the right iliac bone. Needle positioning was confirmed with CT. Initial non heparinized and heparinized aspirate samples were obtained of bone marrow. Core biopsy was performed via the On Control drill needle. COMPLICATIONS: None FINDINGS: Inspection of initial aspirate did reveal visible particles. Intact core biopsy sample was obtained. IMPRESSION: CT guided bone marrow biopsy of right posterior iliac bone with both aspirate and core samples obtained. Electronically Signed   By: Aletta Edouard M.D.   On: 08/15/2017 11:41     PERTINENT LAB RESULTS: CBC: Recent Labs    08/15/17 0454 08/16/17 0439  WBC 220.2* 187.3*  HGB 7.5* 7.1*  HCT 22.1* 21.6*  PLT 36* 27*   CMET CMP     Component Value Date/Time   NA 142 08/16/2017 0439   NA 139 09/13/2016 0935   K 3.3 (L) 08/16/2017 0439   K 4.0 09/13/2016 0935   CL 108 08/16/2017 0439   CO2 24 08/16/2017 0439   CO2 23 09/13/2016 0935   GLUCOSE 94 08/16/2017 0439   GLUCOSE 95 09/13/2016 0935   BUN 11 08/16/2017 0439   BUN 16.7 09/13/2016 0935   CREATININE 1.00  08/16/2017 0439   CREATININE 1.0 09/13/2016 0935   CALCIUM 8.4 (L) 08/16/2017 0439   CALCIUM 9.7 09/13/2016 0935   PROT 5.9 (L) 08/16/2017 0439   PROT 7.9 09/13/2016 0935   ALBUMIN 3.1 (L) 08/16/2017 0439   ALBUMIN 3.8 09/13/2016 0935   AST 19 08/16/2017 0439   AST 13 09/13/2016 0935   ALT 14 (L) 08/16/2017 0439   ALT 64 (H) 09/13/2016 0935   ALKPHOS 84 08/16/2017 0439   ALKPHOS 103 09/13/2016 0935   BILITOT 1.9 (H) 08/16/2017 0439   BILITOT 0.72 09/13/2016 0935   GFRNONAA >60 08/16/2017 0439   GFRAA >60 08/16/2017 0439    GFR Estimated Creatinine Clearance: 108.5 mL/min (by C-G formula based on SCr of 1 mg/dL). No results for input(s): LIPASE, AMYLASE in the last  72 hours. No results for input(s): CKTOTAL, CKMB, CKMBINDEX, TROPONINI in the last 72 hours. Invalid input(s): POCBNP Recent Labs    08/13/17 1410 08/13/17 1818  DDIMER 1.78* 1.07*   Recent Labs    08/13/17 1532  HGBA1C 5.5   No results for input(s): CHOL, HDL, LDLCALC, TRIG, CHOLHDL, LDLDIRECT in the last 72 hours. Recent Labs    08/13/17 1410  TSH 2.78   Recent Labs    08/13/17 1410 08/13/17 1638 08/13/17 1639 08/13/17 1640  VITAMINB12 >1500*  --  1,489*  --   FOLATE  --   --   --  6.7  FERRITIN 843.3*  --  801*  --   TIBC  --   --  216*  --   IRON  --   --  48  --   RETICCTPCT  --  2.6  --   --    Coags: Recent Labs    08/13/17 1638 08/13/17 1818  INR 1.29 1.23   Microbiology: No results found for this or any previous visit (from the past 240 hour(s)).  FURTHER DISCHARGE INSTRUCTIONS:  Get Medicines reviewed and adjusted: Please take all your medications with you for your next visit with your Primary MD  Laboratory/radiological data: Please request your Primary MD to go over all hospital tests and procedure/radiological results at the follow up, please ask your Primary MD to get all Hospital records sent to his/her office.  In some cases, they will be blood work, cultures and  biopsy results pending at the time of your discharge. Please request that your primary care M.D. goes through all the records of your hospital data and follows up on these results.  Also Note the following: If you experience worsening of your admission symptoms, develop shortness of breath, life threatening emergency, suicidal or homicidal thoughts you must seek medical attention immediately by calling 911 or calling your MD immediately  if symptoms less severe.  You must read complete instructions/literature along with all the possible adverse reactions/side effects for all the Medicines you take and that have been prescribed to you. Take any new Medicines after you have completely understood and accpet all the possible adverse reactions/side effects.   Do not drive when taking Pain medications or sleeping medications (Benzodaizepines)  Do not take more than prescribed Pain, Sleep and Anxiety Medications. It is not advisable to combine anxiety,sleep and pain medications without talking with your primary care practitioner  Special Instructions: If you have smoked or chewed Tobacco  in the last 2 yrs please stop smoking, stop any regular Alcohol  and or any Recreational drug use.  Wear Seat belts while driving.  Please note: You were cared for by a hospitalist during your hospital stay. Once you are discharged, your primary care physician will handle any further medical issues. Please note that NO REFILLS for any discharge medications will be authorized once you are discharged, as it is imperative that you return to your primary care physician (or establish a relationship with a primary care physician if you do not have one) for your post hospital discharge needs so that they can reassess your need for medications and monitor your lab values.  Total Time spent coordinating discharge including counseling, education and face to face time equals  45 minutes.  SignedOren Binet 08/16/2017 10:35 AM

## 2017-08-16 NOTE — Telephone Encounter (Signed)
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

## 2017-08-17 MED ORDER — DOCUSATE SODIUM 100 MG PO CAPS
100.00 | ORAL_CAPSULE | ORAL | Status: DC
Start: ? — End: 2017-08-17

## 2017-08-17 MED ORDER — HYDROXYUREA 500 MG PO CAPS
1000.00 | ORAL_CAPSULE | ORAL | Status: DC
Start: 2017-08-17 — End: 2017-08-17

## 2017-08-17 MED ORDER — ESCITALOPRAM OXALATE 10 MG PO TABS
10.00 | ORAL_TABLET | ORAL | Status: DC
Start: 2017-10-15 — End: 2017-08-17

## 2017-08-17 MED ORDER — SODIUM CHLORIDE 0.9 % IV SOLN
INTRAVENOUS | Status: DC
Start: ? — End: 2017-08-17

## 2017-08-17 MED ORDER — ALLOPURINOL 300 MG PO TABS
300.00 | ORAL_TABLET | ORAL | Status: DC
Start: 2017-08-25 — End: 2017-08-17

## 2017-08-17 NOTE — Telephone Encounter (Signed)
Patient notified

## 2017-08-21 ENCOUNTER — Other Ambulatory Visit: Payer: Managed Care, Other (non HMO)

## 2017-08-24 LAB — BCR-ABL1, CML/ALL, PCR, QUANT: E1A2 Transcript: 0.1369 %

## 2017-08-24 MED ORDER — GENERIC EXTERNAL MEDICATION
5.00 | Status: DC
Start: ? — End: 2017-08-24

## 2017-08-24 MED ORDER — LORAZEPAM 1 MG PO TABS
1.00 | ORAL_TABLET | ORAL | Status: DC
Start: ? — End: 2017-08-24

## 2017-08-24 MED ORDER — GENERIC EXTERNAL MEDICATION
5.00 | Status: DC
Start: 2017-08-24 — End: 2017-08-24

## 2017-08-24 MED ORDER — GENERIC EXTERNAL MEDICATION
100.00 | Status: DC
Start: 2017-08-24 — End: 2017-08-24

## 2017-08-24 MED ORDER — GENERIC EXTERNAL MEDICATION
2.00 | Status: DC
Start: 2017-08-24 — End: 2017-08-24

## 2017-08-24 MED ORDER — TRAMADOL HCL 50 MG PO TABS
25.00 | ORAL_TABLET | ORAL | Status: DC
Start: ? — End: 2017-08-24

## 2017-08-24 MED ORDER — MELATONIN 3 MG PO TABS
3.00 | ORAL_TABLET | ORAL | Status: DC
Start: ? — End: 2017-08-24

## 2017-08-24 MED ORDER — OXYMETAZOLINE HCL 0.05 % NA SOLN
2.00 | NASAL | Status: DC
Start: 2017-08-24 — End: 2017-08-24

## 2017-08-24 MED ORDER — SODIUM CHLORIDE 0.9 % IJ SOLN
20.00 | INTRAMUSCULAR | Status: DC
Start: ? — End: 2017-08-24

## 2017-08-24 MED ORDER — SEVELAMER CARBONATE 800 MG PO TABS
800.00 | ORAL_TABLET | ORAL | Status: DC
Start: 2017-08-24 — End: 2017-08-24

## 2017-08-24 MED ORDER — SODIUM CHLORIDE 0.9 % IV SOLN
250.00 | INTRAVENOUS | Status: DC
Start: ? — End: 2017-08-24

## 2017-08-24 MED ORDER — ONDANSETRON HCL 8 MG PO TABS
8.00 | ORAL_TABLET | ORAL | Status: DC
Start: 2017-08-24 — End: 2017-08-24

## 2017-08-24 MED ORDER — SODIUM CHLORIDE 0.9 % IJ SOLN
20.00 | INTRAMUSCULAR | Status: DC
Start: 2017-10-15 — End: 2017-08-24

## 2017-08-27 ENCOUNTER — Encounter (HOSPITAL_COMMUNITY): Payer: Self-pay | Admitting: Hematology

## 2017-08-28 ENCOUNTER — Inpatient Hospital Stay: Payer: Managed Care, Other (non HMO) | Attending: Oncology | Admitting: Oncology

## 2017-08-28 ENCOUNTER — Inpatient Hospital Stay: Payer: Managed Care, Other (non HMO)

## 2017-08-29 LAB — BCR-ABL1 KINASE DOMAIN MUTATION ANALYSIS

## 2017-08-30 LAB — CHROMOSOME ANALYSIS, BONE MARROW

## 2017-09-03 ENCOUNTER — Ambulatory Visit: Payer: Managed Care, Other (non HMO) | Admitting: Internal Medicine

## 2017-09-04 ENCOUNTER — Inpatient Hospital Stay: Payer: Managed Care, Other (non HMO)

## 2017-09-11 ENCOUNTER — Inpatient Hospital Stay: Payer: Managed Care, Other (non HMO) | Attending: Oncology

## 2017-09-21 ENCOUNTER — Encounter: Payer: Self-pay | Admitting: Family Medicine

## 2017-09-26 ENCOUNTER — Encounter: Payer: Self-pay | Admitting: Family Medicine

## 2017-10-02 ENCOUNTER — Ambulatory Visit: Payer: Managed Care, Other (non HMO) | Admitting: Diagnostic Neuroimaging

## 2017-10-03 ENCOUNTER — Encounter: Payer: Self-pay | Admitting: Diagnostic Neuroimaging

## 2017-10-05 ENCOUNTER — Telehealth: Payer: Self-pay | Admitting: *Deleted

## 2017-10-05 NOTE — Telephone Encounter (Signed)
Pt did not show for appt  10-02-17.

## 2017-10-14 MED ORDER — POSACONAZOLE 100 MG PO TBEC
300.00 | DELAYED_RELEASE_TABLET | ORAL | Status: DC
Start: 2017-10-15 — End: 2017-10-14

## 2017-10-14 MED ORDER — MELATONIN 3 MG PO TABS
6.00 | ORAL_TABLET | ORAL | Status: DC
Start: 2017-10-14 — End: 2017-10-14

## 2017-10-14 MED ORDER — GENERIC EXTERNAL MEDICATION
2.00 | Status: DC
Start: ? — End: 2017-10-14

## 2017-10-14 MED ORDER — PRAMOXINE HCL 1 % RE FOAM
RECTAL | Status: DC
Start: ? — End: 2017-10-14

## 2017-10-14 MED ORDER — SENNOSIDES-DOCUSATE SODIUM 8.6-50 MG PO TABS
1.00 | ORAL_TABLET | ORAL | Status: DC
Start: 2017-10-14 — End: 2017-10-14

## 2017-10-14 MED ORDER — GENERIC EXTERNAL MEDICATION
30.00 | Status: DC
Start: ? — End: 2017-10-14

## 2017-10-14 MED ORDER — MOXIFLOXACIN HCL 400 MG PO TABS
400.00 | ORAL_TABLET | ORAL | Status: DC
Start: 2017-10-14 — End: 2017-10-14

## 2017-10-14 MED ORDER — PHENOL 1.4 % MT LIQD
1.00 | OROMUCOSAL | Status: DC
Start: ? — End: 2017-10-14

## 2017-10-14 MED ORDER — TAMSULOSIN HCL 0.4 MG PO CAPS
0.40 | ORAL_CAPSULE | ORAL | Status: DC
Start: 2017-10-15 — End: 2017-10-14

## 2017-10-14 MED ORDER — TRAMADOL HCL 50 MG PO TABS
50.00 | ORAL_TABLET | ORAL | Status: DC
Start: ? — End: 2017-10-14

## 2017-10-14 MED ORDER — ACYCLOVIR 400 MG PO TABS
400.00 | ORAL_TABLET | ORAL | Status: DC
Start: 2017-10-14 — End: 2017-10-14

## 2017-10-14 MED ORDER — OXYCODONE HCL 5 MG PO TABS
5.00 | ORAL_TABLET | ORAL | Status: DC
Start: ? — End: 2017-10-14

## 2017-10-14 MED ORDER — WITCH HAZEL-GLYCERIN EX PADS
MEDICATED_PAD | CUTANEOUS | Status: DC
Start: ? — End: 2017-10-14

## 2017-10-14 MED ORDER — IRON PO
1.00 | ORAL | Status: DC
Start: 2017-10-15 — End: 2017-10-14

## 2017-10-14 MED ORDER — POLYETHYLENE GLYCOL 3350 17 G PO PACK
17.00 g | PACK | ORAL | Status: DC
Start: 2017-10-14 — End: 2017-10-14

## 2017-10-14 MED ORDER — LIDOCAINE VISCOUS HCL 2 % MT SOLN
15.00 | OROMUCOSAL | Status: DC
Start: ? — End: 2017-10-14

## 2017-12-08 MED ORDER — GENERIC EXTERNAL MEDICATION
1.00 | Status: DC
Start: 2018-01-12 — End: 2017-12-08

## 2017-12-08 MED ORDER — ONDANSETRON HCL 4 MG PO TABS
4.00 | ORAL_TABLET | ORAL | Status: DC
Start: 2017-12-08 — End: 2017-12-08

## 2017-12-08 MED ORDER — SODIUM CHLORIDE 0.9 % IV SOLN
INTRAVENOUS | Status: DC
Start: ? — End: 2017-12-08

## 2017-12-08 MED ORDER — SUMATRIPTAN SUCCINATE 50 MG PO TABS
50.00 | ORAL_TABLET | ORAL | Status: DC
Start: ? — End: 2017-12-08

## 2017-12-08 MED ORDER — TRAMADOL HCL 50 MG PO TABS
25.00 | ORAL_TABLET | ORAL | Status: DC
Start: ? — End: 2017-12-08

## 2017-12-08 MED ORDER — OXYMETAZOLINE HCL 0.05 % NA SOLN
2.00 | NASAL | Status: DC
Start: ? — End: 2017-12-08

## 2017-12-08 MED ORDER — MOXIFLOXACIN HCL 400 MG PO TABS
400.00 | ORAL_TABLET | ORAL | Status: DC
Start: 2017-12-09 — End: 2017-12-08

## 2017-12-08 MED ORDER — PROCHLORPERAZINE MALEATE 5 MG PO TABS
5.00 | ORAL_TABLET | ORAL | Status: DC
Start: 2017-12-09 — End: 2017-12-08

## 2017-12-08 MED ORDER — LORAZEPAM 1 MG PO TABS
1.00 | ORAL_TABLET | ORAL | Status: DC
Start: ? — End: 2017-12-08

## 2017-12-08 MED ORDER — FLUCONAZOLE 200 MG PO TABS
200.00 | ORAL_TABLET | ORAL | Status: DC
Start: 2017-12-09 — End: 2017-12-08

## 2017-12-08 MED ORDER — DASATINIB 140 MG PO TABS
140.00 | ORAL_TABLET | ORAL | Status: DC
Start: 2017-12-09 — End: 2017-12-08

## 2017-12-08 MED ORDER — ONDANSETRON 4 MG PO TBDP
4.00 | ORAL_TABLET | ORAL | Status: DC
Start: ? — End: 2017-12-08

## 2017-12-08 MED ORDER — MELATONIN 3 MG PO TABS
3.00 | ORAL_TABLET | ORAL | Status: DC
Start: ? — End: 2017-12-08

## 2017-12-08 MED ORDER — ACYCLOVIR 400 MG PO TABS
400.00 | ORAL_TABLET | ORAL | Status: DC
Start: 2018-01-11 — End: 2017-12-08

## 2018-01-11 MED ORDER — OXYCODONE HCL 5 MG PO TABS
5.00 | ORAL_TABLET | ORAL | Status: DC
Start: ? — End: 2018-01-11

## 2018-01-11 MED ORDER — SODIUM CHLORIDE FLUSH 0.9 % IV SOLN
20.00 | INTRAVENOUS | Status: DC
Start: 2018-01-12 — End: 2018-01-11

## 2018-01-11 MED ORDER — MOXIFLOXACIN HCL 400 MG PO TABS
400.00 | ORAL_TABLET | ORAL | Status: DC
Start: 2018-01-11 — End: 2018-01-11

## 2018-01-11 MED ORDER — DASATINIB 20 MG PO TABS
40.00 | ORAL_TABLET | ORAL | Status: DC
Start: 2018-01-12 — End: 2018-01-11

## 2018-01-11 MED ORDER — SODIUM CHLORIDE 0.9 % IV SOLN
250.00 | INTRAVENOUS | Status: DC
Start: ? — End: 2018-01-11

## 2018-01-11 MED ORDER — DIPHENHYDRAMINE HCL 50 MG/ML IJ SOLN
25.00 | INTRAMUSCULAR | Status: DC
Start: ? — End: 2018-01-11

## 2018-01-11 MED ORDER — PROCHLORPERAZINE MALEATE 5 MG PO TABS
5.00 | ORAL_TABLET | ORAL | Status: DC
Start: ? — End: 2018-01-11

## 2018-01-11 MED ORDER — POSACONAZOLE 100 MG PO TBEC
300.00 | DELAYED_RELEASE_TABLET | ORAL | Status: DC
Start: 2018-01-12 — End: 2018-01-11

## 2018-01-11 MED ORDER — GENERIC EXTERNAL MEDICATION
1.00 | Status: DC
Start: ? — End: 2018-01-11

## 2018-01-11 MED ORDER — GENERIC EXTERNAL MEDICATION
10.00 | Status: DC
Start: ? — End: 2018-01-11

## 2018-01-11 MED ORDER — SODIUM CHLORIDE FLUSH 0.9 % IV SOLN
20.00 | INTRAVENOUS | Status: DC
Start: ? — End: 2018-01-11

## 2018-01-11 MED ORDER — CYCLOBENZAPRINE HCL 5 MG PO TABS
5.00 | ORAL_TABLET | ORAL | Status: DC
Start: ? — End: 2018-01-11

## 2018-01-11 MED ORDER — ONDANSETRON HCL 4 MG PO TABS
4.00 | ORAL_TABLET | ORAL | Status: DC
Start: ? — End: 2018-01-11

## 2018-01-28 MED ORDER — ONDANSETRON HCL 4 MG/2ML IJ SOLN
4.00 | INTRAMUSCULAR | Status: DC
Start: ? — End: 2018-01-28

## 2018-01-28 MED ORDER — MELATONIN 3 MG PO TABS
3.00 | ORAL_TABLET | ORAL | Status: DC
Start: ? — End: 2018-01-28

## 2018-01-28 MED ORDER — VENETOCLAX 100 MG PO TABS
100.00 | ORAL_TABLET | ORAL | Status: DC
Start: 2018-01-28 — End: 2018-01-28

## 2018-01-28 MED ORDER — TRAMADOL HCL 50 MG PO TABS
50.00 | ORAL_TABLET | ORAL | Status: DC
Start: ? — End: 2018-01-28

## 2018-01-28 MED ORDER — GENERIC EXTERNAL MEDICATION
10.00 | Status: DC
Start: ? — End: 2018-01-28

## 2018-01-28 MED ORDER — CYCLOBENZAPRINE HCL 5 MG PO TABS
5.00 | ORAL_TABLET | ORAL | Status: DC
Start: ? — End: 2018-01-28

## 2018-01-28 MED ORDER — OLANZAPINE 5 MG PO TABS
5.00 | ORAL_TABLET | ORAL | Status: DC
Start: 2018-01-28 — End: 2018-01-28

## 2018-01-28 MED ORDER — DASATINIB 20 MG PO TABS
40.00 | ORAL_TABLET | ORAL | Status: DC
Start: 2018-01-28 — End: 2018-01-28

## 2018-01-28 MED ORDER — SODIUM CHLORIDE 0.9 % IV SOLN
250.00 | INTRAVENOUS | Status: DC
Start: ? — End: 2018-01-28

## 2018-01-28 MED ORDER — ACYCLOVIR 400 MG PO TABS
400.00 | ORAL_TABLET | ORAL | Status: DC
Start: 2018-01-28 — End: 2018-01-28

## 2018-01-28 MED ORDER — MOXIFLOXACIN HCL 400 MG PO TABS
400.00 | ORAL_TABLET | ORAL | Status: DC
Start: 2018-01-29 — End: 2018-01-28

## 2018-01-28 MED ORDER — OXYCODONE HCL 5 MG PO TABS
5.00 | ORAL_TABLET | ORAL | Status: DC
Start: ? — End: 2018-01-28

## 2018-01-28 MED ORDER — LORAZEPAM 1 MG PO TABS
1.00 | ORAL_TABLET | ORAL | Status: DC
Start: ? — End: 2018-01-28

## 2018-01-28 MED ORDER — POSACONAZOLE 100 MG PO TBEC
300.00 | DELAYED_RELEASE_TABLET | ORAL | Status: DC
Start: 2018-01-29 — End: 2018-01-28

## 2018-01-28 MED ORDER — POLYETHYLENE GLYCOL 3350 17 G PO PACK
17.00 | PACK | ORAL | Status: DC
Start: ? — End: 2018-01-28

## 2018-01-28 MED ORDER — SENNOSIDES-DOCUSATE SODIUM 8.6-50 MG PO TABS
1.00 | ORAL_TABLET | ORAL | Status: DC
Start: 2018-01-28 — End: 2018-01-28

## 2018-01-28 MED ORDER — SODIUM CHLORIDE 0.9 % IV SOLN
INTRAVENOUS | Status: DC
Start: ? — End: 2018-01-28

## 2018-01-28 MED ORDER — ALLOPURINOL 300 MG PO TABS
300.00 | ORAL_TABLET | ORAL | Status: DC
Start: 2018-01-29 — End: 2018-01-28

## 2018-01-28 MED ORDER — GENERIC EXTERNAL MEDICATION
30.00 | Status: DC
Start: ? — End: 2018-01-28

## 2018-03-26 MED ORDER — ACYCLOVIR 400 MG PO TABS
400.00 | ORAL_TABLET | ORAL | Status: DC
Start: 2018-03-26 — End: 2018-03-26

## 2018-03-26 MED ORDER — MELATONIN 3 MG PO TABS
6.00 | ORAL_TABLET | ORAL | Status: DC
Start: ? — End: 2018-03-26

## 2018-03-26 MED ORDER — TAMSULOSIN HCL 0.4 MG PO CAPS
0.40 | ORAL_CAPSULE | ORAL | Status: DC
Start: 2018-03-27 — End: 2018-03-26

## 2018-03-26 MED ORDER — DIPHENHYDRAMINE-ZINC ACETATE 2-0.1 % EX CREA
TOPICAL_CREAM | CUTANEOUS | Status: DC
Start: ? — End: 2018-03-26

## 2018-03-26 MED ORDER — PHENOL 1.4 % MT LIQD
1.00 | OROMUCOSAL | Status: DC
Start: ? — End: 2018-03-26

## 2018-03-26 MED ORDER — SODIUM CHLORIDE 0.9 % IV SOLN
250.00 | INTRAVENOUS | Status: DC
Start: ? — End: 2018-03-26

## 2018-03-26 MED ORDER — POSACONAZOLE 100 MG PO TBEC
400.00 | DELAYED_RELEASE_TABLET | ORAL | Status: DC
Start: 2018-03-27 — End: 2018-03-26

## 2018-03-26 MED ORDER — MOXIFLOXACIN HCL 400 MG PO TABS
400.00 | ORAL_TABLET | ORAL | Status: DC
Start: 2018-03-26 — End: 2018-03-26

## 2018-03-26 MED ORDER — NYSTATIN 100000 UNIT/ML MT SUSP
500000.00 | OROMUCOSAL | Status: DC
Start: 2018-03-26 — End: 2018-03-26

## 2018-03-26 MED ORDER — GENERIC EXTERNAL MEDICATION
30.00 | Status: DC
Start: 2018-03-26 — End: 2018-03-26

## 2018-03-26 MED ORDER — SENNOSIDES-DOCUSATE SODIUM 8.6-50 MG PO TABS
2.00 | ORAL_TABLET | ORAL | Status: DC
Start: 2018-03-26 — End: 2018-03-26

## 2018-03-26 MED ORDER — ONDANSETRON HCL 4 MG/2ML IJ SOLN
4.00 | INTRAMUSCULAR | Status: DC
Start: ? — End: 2018-03-26

## 2018-03-26 MED ORDER — PROCHLORPERAZINE MALEATE 5 MG PO TABS
5.00 | ORAL_TABLET | ORAL | Status: DC
Start: ? — End: 2018-03-26

## 2018-03-26 MED ORDER — SODIUM CHLORIDE 0.9 % IR SOLN
25.00 | Status: DC
Start: ? — End: 2018-03-26

## 2018-03-26 MED ORDER — VENETOCLAX 100 MG PO TABS
100.00 | ORAL_TABLET | ORAL | Status: DC
Start: 2018-03-26 — End: 2018-03-26

## 2018-03-26 MED ORDER — WITCH HAZEL-GLYCERIN EX PADS
MEDICATED_PAD | CUTANEOUS | Status: DC
Start: ? — End: 2018-03-26

## 2018-03-26 MED ORDER — DASATINIB 20 MG PO TABS
40.00 | ORAL_TABLET | ORAL | Status: DC
Start: 2018-03-27 — End: 2018-03-26

## 2018-03-26 MED ORDER — OLANZAPINE 5 MG PO TABS
5.00 | ORAL_TABLET | ORAL | Status: DC
Start: 2018-03-26 — End: 2018-03-26

## 2018-03-26 MED ORDER — OXYCODONE HCL 5 MG PO TABS
10.00 | ORAL_TABLET | ORAL | Status: DC
Start: ? — End: 2018-03-26

## 2018-03-26 MED ORDER — HYDRALAZINE HCL 20 MG/ML IJ SOLN
5.00 | INTRAMUSCULAR | Status: DC
Start: ? — End: 2018-03-26

## 2018-03-26 MED ORDER — BENZOCAINE-MENTHOL 6-10 MG MT LOZG
1.00 | LOZENGE | OROMUCOSAL | Status: DC
Start: ? — End: 2018-03-26

## 2018-03-26 MED ORDER — POLYETHYLENE GLYCOL 3350 17 G PO PACK
17.00 | PACK | ORAL | Status: DC
Start: ? — End: 2018-03-26

## 2018-03-26 MED ORDER — IPRATROPIUM-ALBUTEROL 0.5-2.5 (3) MG/3ML IN SOLN
3.00 | RESPIRATORY_TRACT | Status: DC
Start: ? — End: 2018-03-26

## 2018-03-26 MED ORDER — GENERIC EXTERNAL MEDICATION
400.00 | Status: DC
Start: 2018-03-26 — End: 2018-03-26

## 2018-04-10 IMAGING — DX DG CHEST 2V
2 series · 2 of 2 positions shown · non-contrast
Comparison: Portable chest x-ray March 04, 2017

CLINICAL DATA: Status post video assisted thoracoscopy and left
lower wedge and left upper resection 3 days ago.

EXAM:
CHEST  2 VIEW

[w chest pa]
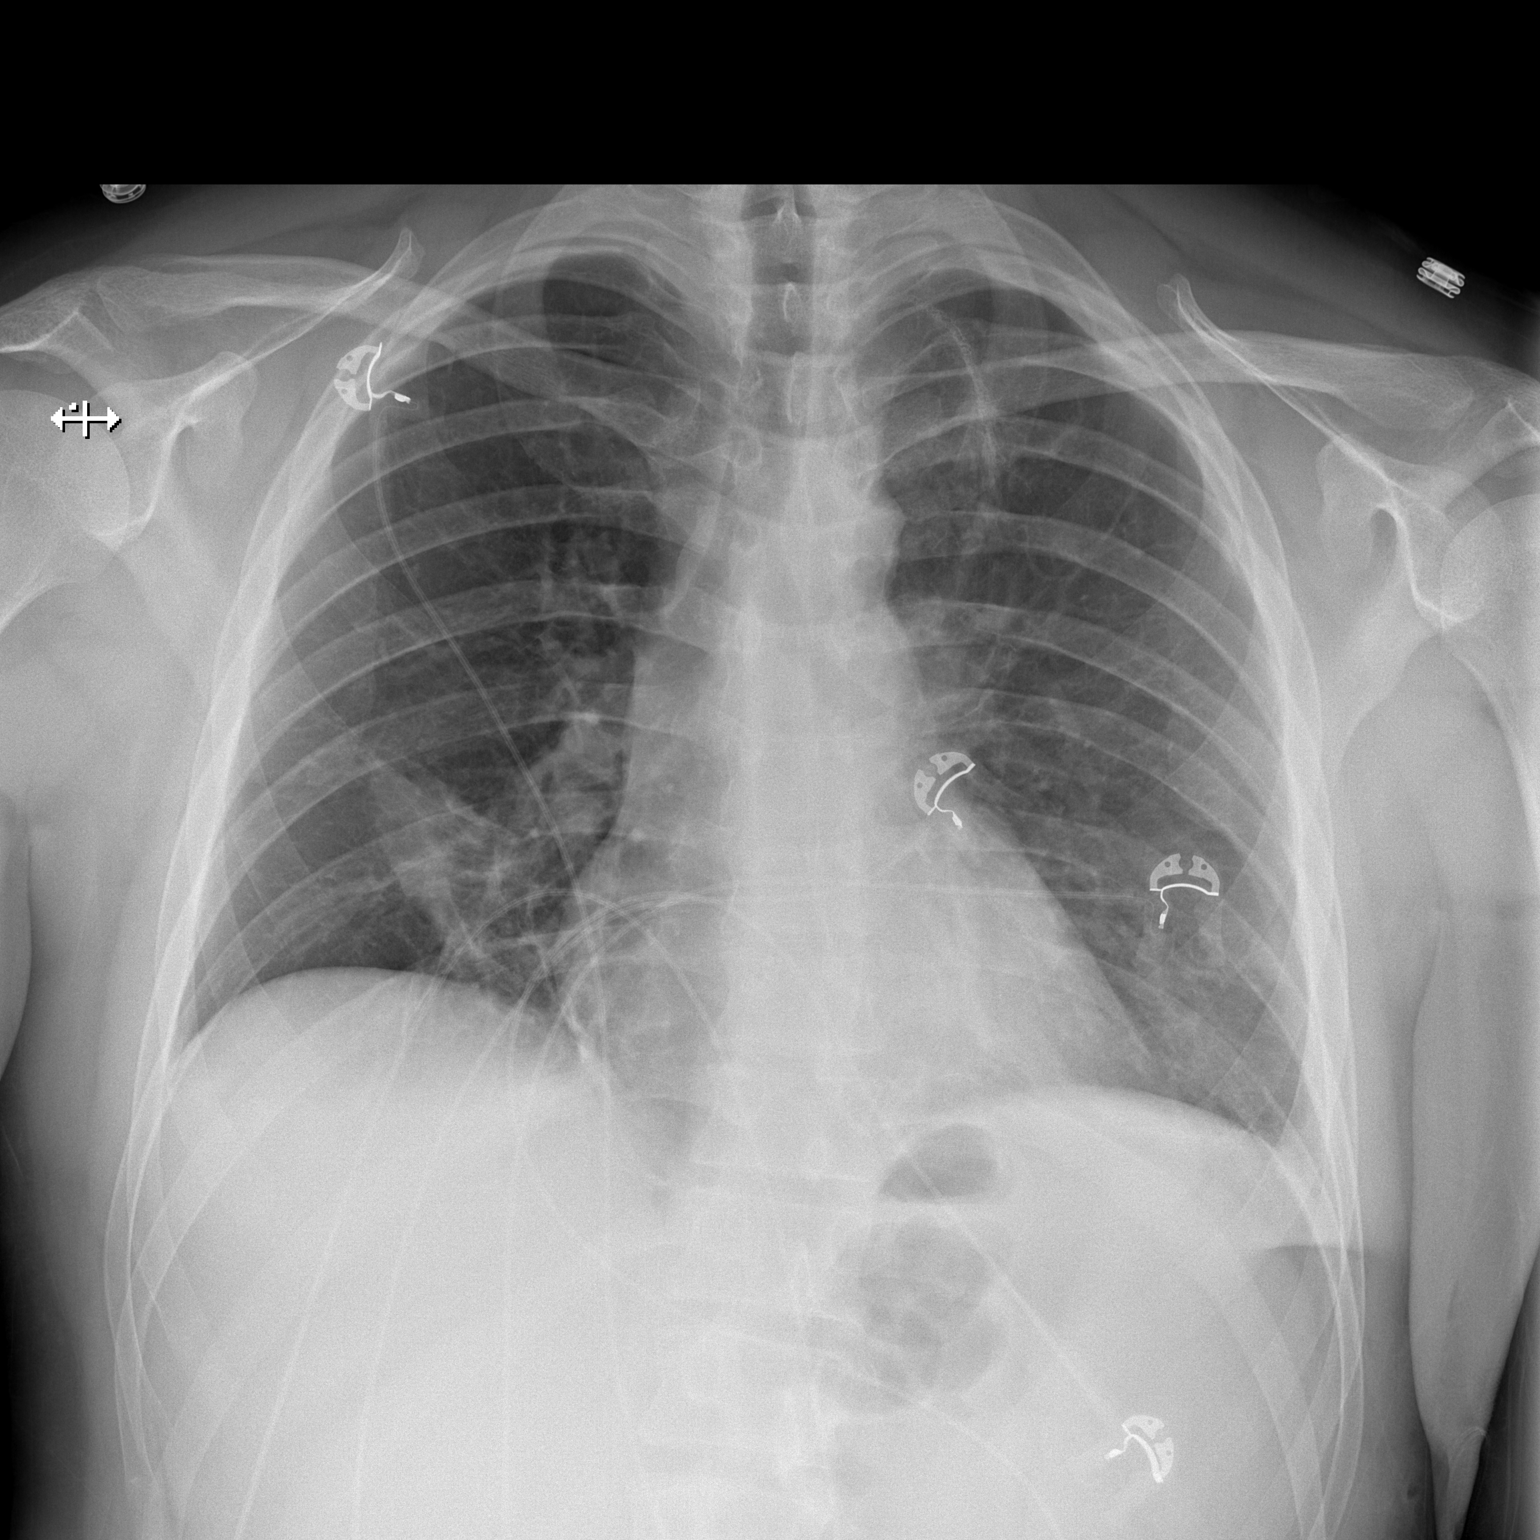

[w chest lat]
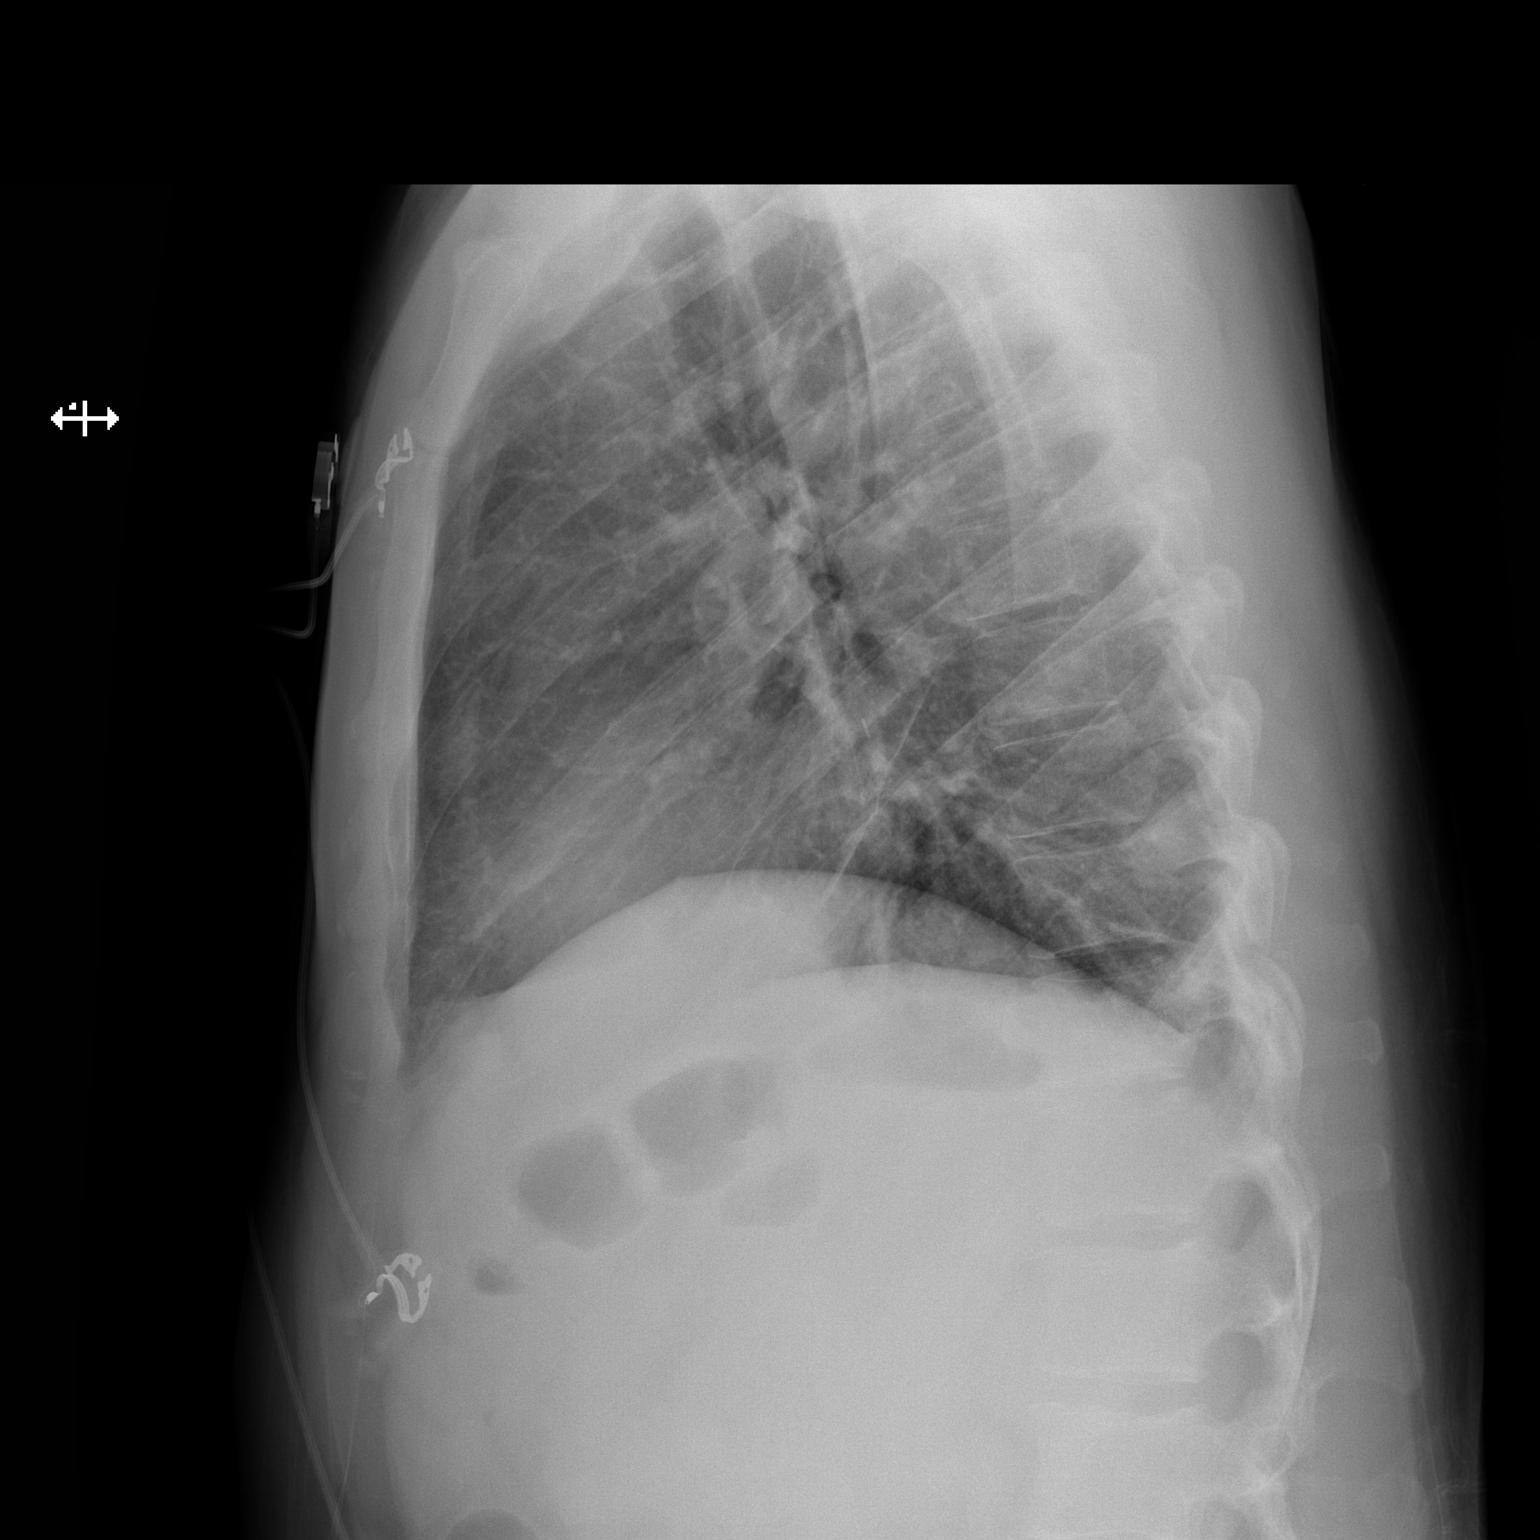

[2 of 2 positions shown; findings below may reference images not displayed]

FINDINGS: The lungs are adequately inflated. There is a suture line visible in
the left upper lobe. The left chest tube is been removed. No
definite pneumothorax persists. There is subsegmental atelectasis in
the right infrahilar region which is stable. The heart and pulmonary
vascularity are normal. The mediastinum is normal in width. There is
no pleural effusion.
IMPRESSION: No definite pneumothorax on the left since chest tube removal.
Stable right infrahilar subsegmental atelectasis or infiltrate.

## 2018-07-07 DEATH — deceased
